# Patient Record
Sex: Female | Born: 1945
Health system: Southern US, Community
[De-identification: ages and names within clinical notes are randomized; demographics above are authoritative.]

## PROBLEM LIST (undated history)

## (undated) DIAGNOSIS — M1990 Unspecified osteoarthritis, unspecified site: Secondary | ICD-10-CM

## (undated) DIAGNOSIS — I10 Essential (primary) hypertension: Secondary | ICD-10-CM

## (undated) DIAGNOSIS — H409 Unspecified glaucoma: Secondary | ICD-10-CM

## (undated) DIAGNOSIS — E119 Type 2 diabetes mellitus without complications: Secondary | ICD-10-CM

## (undated) HISTORY — DX: Type 2 diabetes mellitus without complications: E11.9

## (undated) HISTORY — PX: NEPHRECTOMY: SHX65

## (undated) HISTORY — DX: Unspecified glaucoma: H40.9

## (undated) HISTORY — DX: Unspecified osteoarthritis, unspecified site: M19.90

## (undated) HISTORY — PX: CHOLECYSTECTOMY: SHX55

## (undated) HISTORY — DX: Essential (primary) hypertension: I10

## (undated) HISTORY — PX: OOPHORECTOMY: SHX86

---

## 2010-06-13 ENCOUNTER — Encounter (INDEPENDENT_AMBULATORY_CARE_PROVIDER_SITE_OTHER): Payer: Self-pay | Admitting: *Deleted

## 2010-06-13 LAB — CONVERTED CEMR LAB
ALT: 12 units/L (ref 0–35)
Albumin: 4.5 g/dL (ref 3.5–5.2)
CO2: 30 meq/L (ref 19–32)
Chloride: 101 meq/L (ref 96–112)
Cholesterol: 209 mg/dL — ABNORMAL HIGH (ref 0–200)
Glucose, Bld: 251 mg/dL — ABNORMAL HIGH (ref 70–99)
Microalb, Ur: 0.5 mg/dL (ref 0.00–1.89)
Potassium: 4.2 meq/L (ref 3.5–5.3)
Sodium: 141 meq/L (ref 135–145)
Total Bilirubin: 0.6 mg/dL (ref 0.3–1.2)
Total Protein: 7.3 g/dL (ref 6.0–8.3)
Triglycerides: 153 mg/dL — ABNORMAL HIGH (ref <150)
VLDL: 31 mg/dL (ref 0–40)

## 2010-07-29 ENCOUNTER — Emergency Department (HOSPITAL_COMMUNITY)
Admission: EM | Admit: 2010-07-29 | Discharge: 2010-07-29 | Payer: Self-pay | Source: Home / Self Care | Admitting: Emergency Medicine

## 2010-07-29 LAB — DIFFERENTIAL
Basophils Absolute: 0 10*3/uL (ref 0.0–0.1)
Lymphocytes Relative: 43 % (ref 12–46)
Lymphs Abs: 3.3 10*3/uL (ref 0.7–4.0)
Monocytes Absolute: 0.5 10*3/uL (ref 0.1–1.0)
Neutro Abs: 3.6 10*3/uL (ref 1.7–7.7)

## 2010-07-29 LAB — URINALYSIS, ROUTINE W REFLEX MICROSCOPIC
Ketones, ur: 15 mg/dL — AB
Nitrite: NEGATIVE
Urobilinogen, UA: 0.2 mg/dL (ref 0.0–1.0)
pH: 5 (ref 5.0–8.0)

## 2010-07-29 LAB — CBC
HCT: 40.4 % (ref 36.0–46.0)
Hemoglobin: 13.3 g/dL (ref 12.0–15.0)
MCHC: 32.9 g/dL (ref 30.0–36.0)

## 2010-07-29 LAB — COMPREHENSIVE METABOLIC PANEL
Albumin: 4 g/dL (ref 3.5–5.2)
BUN: 12 mg/dL (ref 6–23)
Calcium: 9.2 mg/dL (ref 8.4–10.5)
Creatinine, Ser: 0.84 mg/dL (ref 0.4–1.2)
Glucose, Bld: 264 mg/dL — ABNORMAL HIGH (ref 70–99)
Potassium: 3.9 mEq/L (ref 3.5–5.1)
Total Protein: 6.9 g/dL (ref 6.0–8.3)

## 2010-07-29 LAB — LIPASE, BLOOD: Lipase: 30 U/L (ref 11–59)

## 2010-07-29 LAB — GLUCOSE, CAPILLARY

## 2010-08-03 ENCOUNTER — Other Ambulatory Visit: Payer: Self-pay | Admitting: Surgery

## 2010-08-03 ENCOUNTER — Other Ambulatory Visit (HOSPITAL_COMMUNITY): Payer: Self-pay | Admitting: Surgery

## 2010-08-03 ENCOUNTER — Ambulatory Visit (HOSPITAL_COMMUNITY)
Admission: RE | Admit: 2010-08-03 | Discharge: 2010-08-04 | Disposition: A | Discharge status: Home / Self Care | Payer: PRIVATE HEALTH INSURANCE | Source: Ambulatory Visit | Attending: Surgery | Admitting: Surgery

## 2010-08-03 ENCOUNTER — Ambulatory Visit (HOSPITAL_COMMUNITY): Payer: PRIVATE HEALTH INSURANCE

## 2010-08-03 DIAGNOSIS — Z01818 Encounter for other preprocedural examination: Secondary | ICD-10-CM | POA: Insufficient documentation

## 2010-08-03 DIAGNOSIS — K829 Disease of gallbladder, unspecified: Secondary | ICD-10-CM

## 2010-08-03 DIAGNOSIS — K8020 Calculus of gallbladder without cholecystitis without obstruction: Secondary | ICD-10-CM | POA: Insufficient documentation

## 2010-08-03 DIAGNOSIS — Z01812 Encounter for preprocedural laboratory examination: Secondary | ICD-10-CM | POA: Insufficient documentation

## 2010-08-03 DIAGNOSIS — I10 Essential (primary) hypertension: Secondary | ICD-10-CM | POA: Insufficient documentation

## 2010-08-03 DIAGNOSIS — E119 Type 2 diabetes mellitus without complications: Secondary | ICD-10-CM | POA: Insufficient documentation

## 2010-08-03 DIAGNOSIS — Z01810 Encounter for preprocedural cardiovascular examination: Secondary | ICD-10-CM | POA: Insufficient documentation

## 2010-08-03 LAB — SURGICAL PCR SCREEN
MRSA, PCR: NEGATIVE
Staphylococcus aureus: NEGATIVE

## 2010-08-03 LAB — GLUCOSE, CAPILLARY

## 2010-08-04 LAB — GLUCOSE, CAPILLARY

## 2010-08-06 LAB — COMPREHENSIVE METABOLIC PANEL
ALT: 16 U/L (ref 0–35)
AST: 19 U/L (ref 0–37)
Alkaline Phosphatase: 89 U/L (ref 39–117)
CO2: 29 mEq/L (ref 19–32)
Calcium: 10.1 mg/dL (ref 8.4–10.5)
GFR calc Af Amer: >60 mL/min (ref >60)
Potassium: 4.2 mEq/L (ref 3.5–5.1)
Sodium: 141 mEq/L (ref 135–145)
Total Protein: 7.1 g/dL (ref 6.0–8.3)

## 2010-08-06 LAB — CBC
HCT: 41.3 % (ref 36.0–46.0)
MCHC: 33.2 g/dL (ref 30.0–36.0)
MCV: 86.2 fL (ref 78.0–100.0)
RDW: 12.3 % (ref 11.5–15.5)

## 2010-08-15 NOTE — Op Note (Signed)
  NAME:  Tina Phelps, Tina Phelps                 ACCOUNT NO.:  [id number]  MEDICAL RECORD NO.:  [id number]           PATIENT TYPE:  I  LOCATION:  5128                         FACILITY:  [facility]  PHYSICIAN:  Abigail Miyamoto, M.D. DATE OF BIRTH:  December 31, 1945  DATE OF PROCEDURE:  08/03/2010 DATE OF DISCHARGE:                              OPERATIVE REPORT   PREOPERATIVE DIAGNOSIS:  Symptomatic cholelithiasis.  POSTOPERATIVE DIAGNOSIS:  Symptomatic cholelithiasis.  PROCEDURE:  Laparoscopic cholecystectomy.  SURGEON:  Abigail Miyamoto, MD.  ANESTHESIA:  General and 0.25% Marcaine with epinephrine.  ESTIMATED BLOOD LOSS:  Minimal.  FINDINGS:  The patient had a chronically scarred appearing gallbladder full of gallstones.  PROCEDURE IN DETAIL:  The patient was brought to the operating room, identified as Tina Phelps.  She was placed supine on the operating table and general anesthesia was induced.  Her abdomen was then prepped and draped in the usual sterile fashion.  Using a 15-blade, a small vertical incision was made below the umbilicus.  This was carried down to the fascia, which was then incised with a scalpel.  A hemostat was then used to pass to the peritoneal cavity under direct vision.  Next, a 0 Vicryl pursestring suture was placed around the fascial opening.  The Hasson port was placed through the opening and insufflation of the abdomen was begun.  A 5-mm port was then placed in the epigastrium and two more in the right upper quadrant, all under direct vision.  Gallbladder was identified and it was found to be distended and completely full of gallstones.  It was elevated above the liver.  The cystic duct was then easily dissected out and a critical window was achieved around it.  It was clipped three times proximally, once distally, and transected.  The cystic artery was then identified and clipped several times proximally and distally and transected as well.  The gallbladder was then  easily dissected free from liver bed with the electrocautery.  Once it was free from liver bed, the liver bed was examined.  Hemostasis was felt to be achieved.  The gallbladder was then removed through the incision at the umbilicus.  I had to open up the gallbladder and remove several gallstones as it was quite packed in order to pull it through the fascial opening.  The 0 Vicryl to the umbilicus was then tied in place closing the fascial defect.  I then thoroughly irrigated the abdomen with normal saline.  Again, hemostasis appeared to be achieved.  All ports were then removed under direct vision and the abdomen was deflated.  All incisions were then anesthetized with Marcaine and closed with 4-0 Monocryl subcuticular sutures.  Steri-Strips and Band-Aids were then applied. The patient tolerated the procedure well.  All counts were correct at the end of the procedure.  The patient was then extubated in the operating room and taken in stable condition to the recovery room.     Abigail Miyamoto, M.D.     DB/MEDQ  D:  08/03/2010  T:  08/04/2010  Job:  [id number]  Electronically Signed by Abigail Miyamoto M.D. on 08/15/2010 07:29:09 PM

## 2010-08-22 NOTE — Discharge Summary (Signed)
  NAME:  Tina Phelps, Virag Neaveh                 ACCOUNT NO.:  [id number]  MEDICAL RECORD NO.:  [id number]           PATIENT TYPE:  I  LOCATION:  5128                         FACILITY:  [facility]  PHYSICIAN:  Abigail Miyamoto, M.D. DATE OF BIRTH:  Jan 03, 1946  DATE OF ADMISSION:  08/03/2010 DATE OF DISCHARGE:  08/04/2010                              DISCHARGE SUMMARY   DISCHARGE DIAGNOSES:  Symptomatic cholelithiasis.  She is status post laparoscopic cholecystectomy.  SUMMARY OF HISTORY:  This is a 65 year old female from [place] who presents with symptomatic cholelithiasis.  Decision was made to proceed with laparoscopic cholecystectomy.  HOSPITAL COURSE:  The patient was admitted and taken to the operating room where she underwent a laparoscopic cholecystectomy.  She tolerated the procedure well and was taken to a regular surgical floor.  She was kept overnight secondary to her comorbidities and the fact she did not speak any [place].  On postoperative day 1, she was doing quite well, she was tolerating her diet.  Her pain was well controlled.  Her incisions were healing well, and decision was made to discharge the patient to home.  DISCHARGE DIET:  Regular.  DISCHARGE ACTIVITY:  She is to do no heavy lifting greater than 20 pounds for the next 2 weeks.  She may shower.  DISCHARGE FOLLOWUP:  She will follow up with [facility] in 1 week postdischarge.     Abigail Miyamoto, M.D.     DB/MEDQ  D:  08/15/2010  T:  08/16/2010  Job:  [id number]  Electronically Signed by Abigail Miyamoto M.D. on 08/22/2010 01:36:30 PM

## 2010-08-29 ENCOUNTER — Encounter (INDEPENDENT_AMBULATORY_CARE_PROVIDER_SITE_OTHER): Payer: Self-pay | Admitting: *Deleted

## 2010-08-29 LAB — CONVERTED CEMR LAB
ALT: 10 units/L (ref 0–35)
AST: 15 units/L (ref 0–37)
Albumin: 4.5 g/dL (ref 3.5–5.2)
Alkaline Phosphatase: 93 units/L (ref 39–117)
BUN: 11 mg/dL (ref 6–23)
CO2: 25 meq/L (ref 19–32)
Calcium: 10 mg/dL (ref 8.4–10.5)
Chloride: 101 meq/L (ref 96–112)
Creatinine, Ser: 0.79 mg/dL (ref 0.40–1.20)
Glucose, Bld: 251 mg/dL — ABNORMAL HIGH (ref 70–99)
Potassium: 4.8 meq/L (ref 3.5–5.3)
Sodium: 137 meq/L (ref 135–145)
Total Bilirubin: 0.6 mg/dL (ref 0.3–1.2)
Total Protein: 7.3 g/dL (ref 6.0–8.3)

## 2011-11-12 IMAGING — US US ABDOMEN COMPLETE
1 series · 14 of 25 positions shown · non-contrast
Comparison: None.

CLINICAL DATA: Abdominal pain.  Nausea and vomiting.

ABDOMINAL ULTRASOUND COMPLETE

[Series 1: us abdomen complete · 0.21mm/px · 14 of 59 slices shown]
[im 1/59]
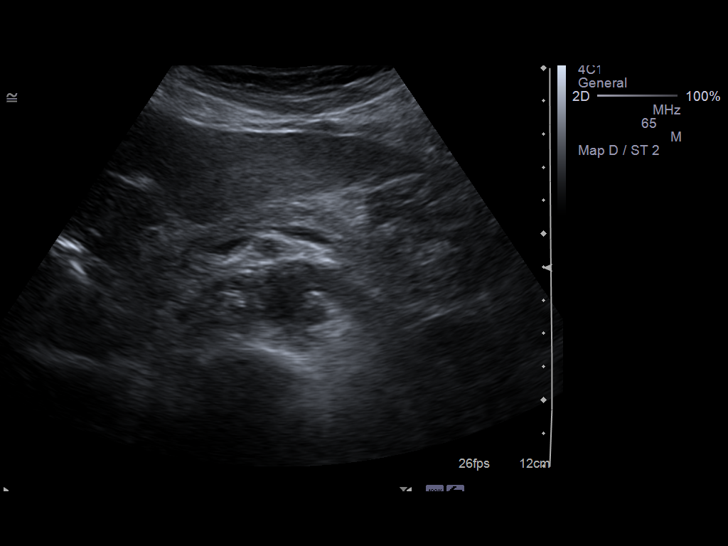
[im 5/59]
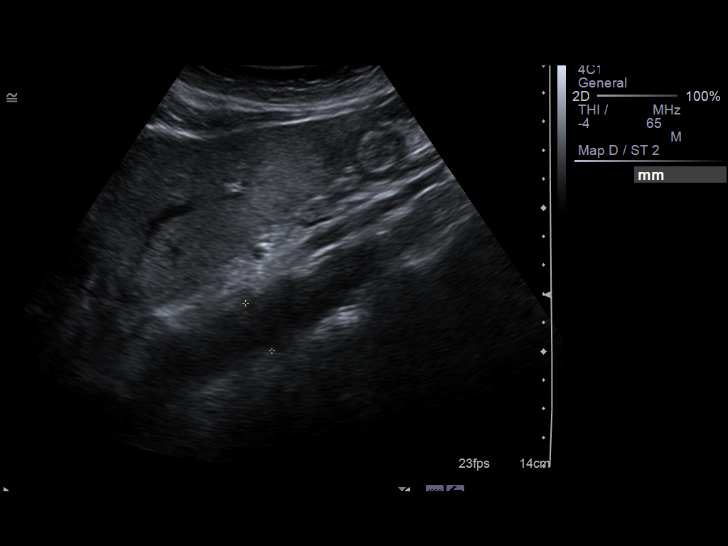
[im 10/59]
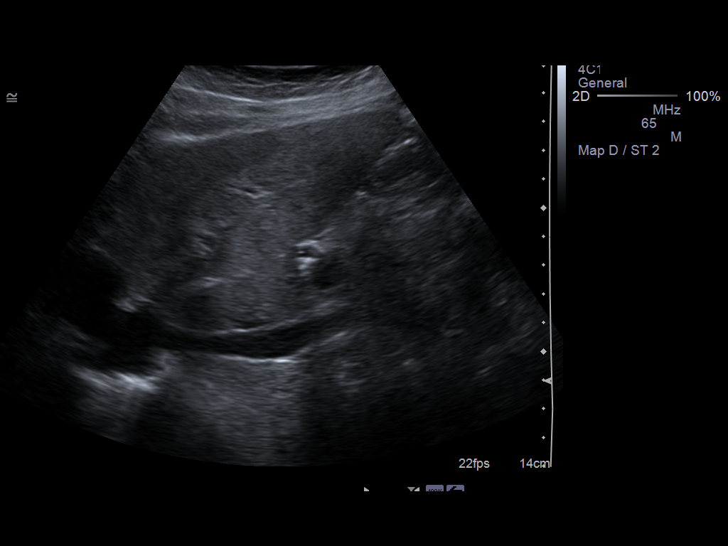
[im 15/59]
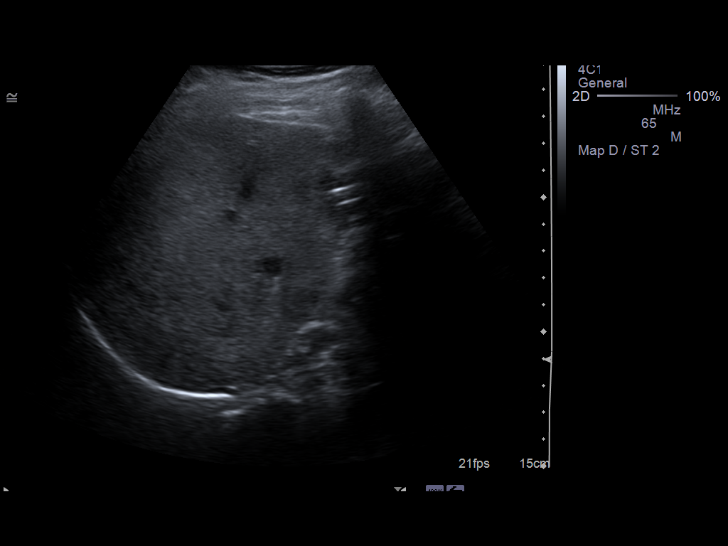
[im 20/59]
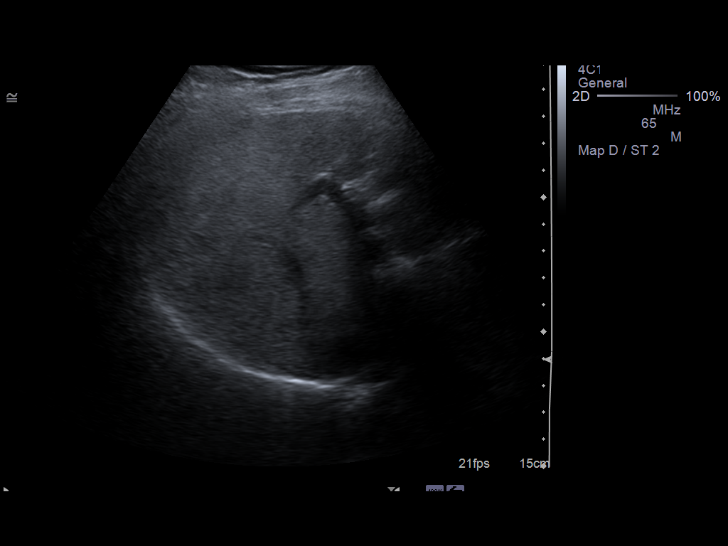
[im 22/59]
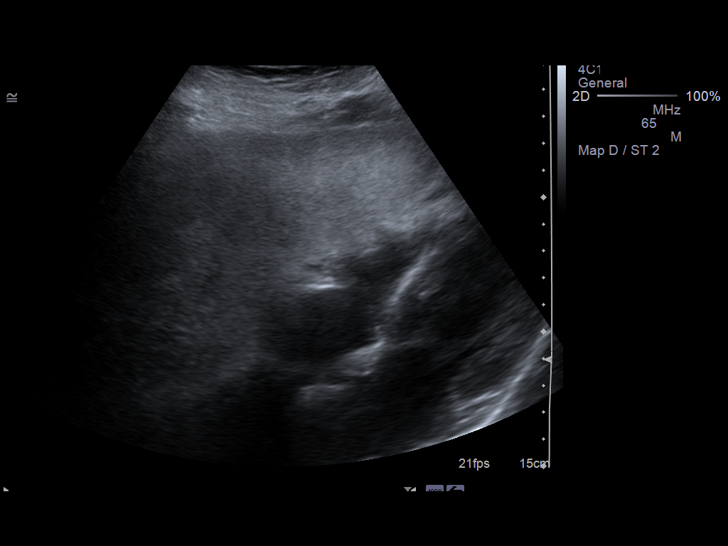
[im 27/59]
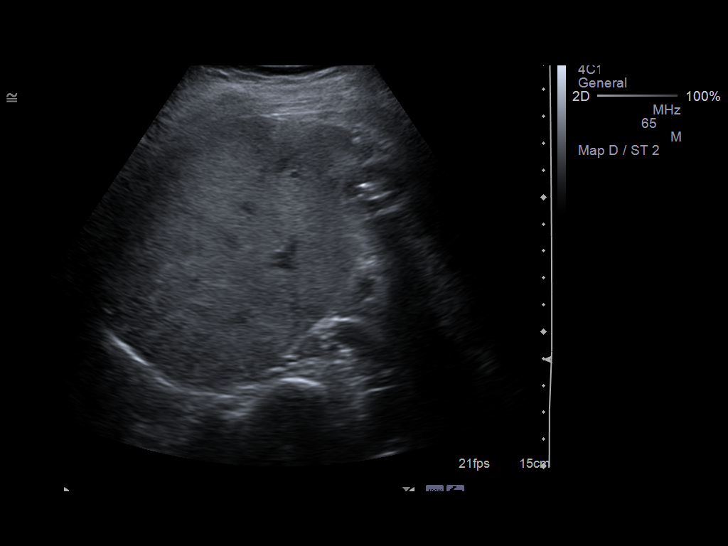
[im 32/59]
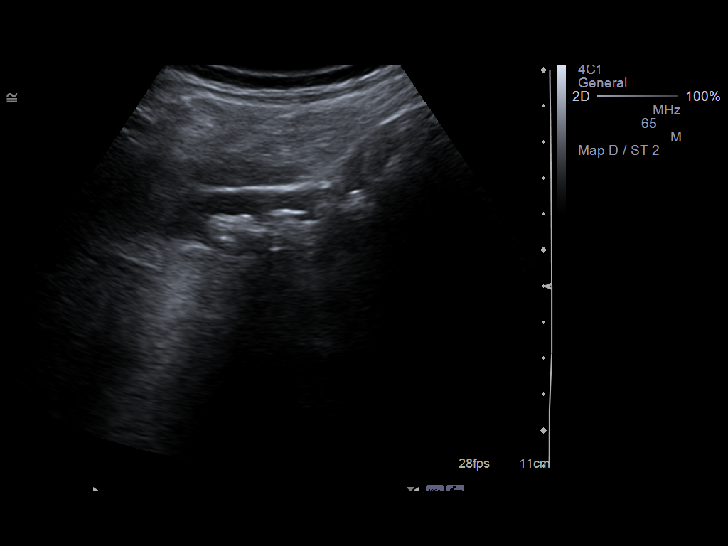
[im 37/59]
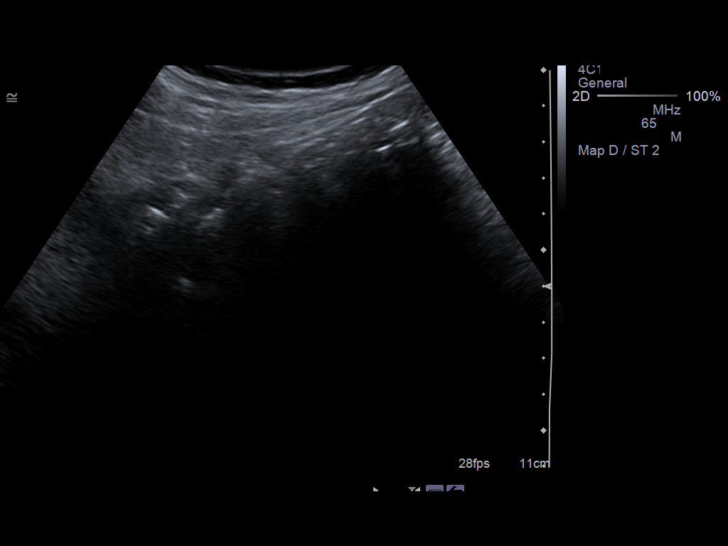
[im 39/59]
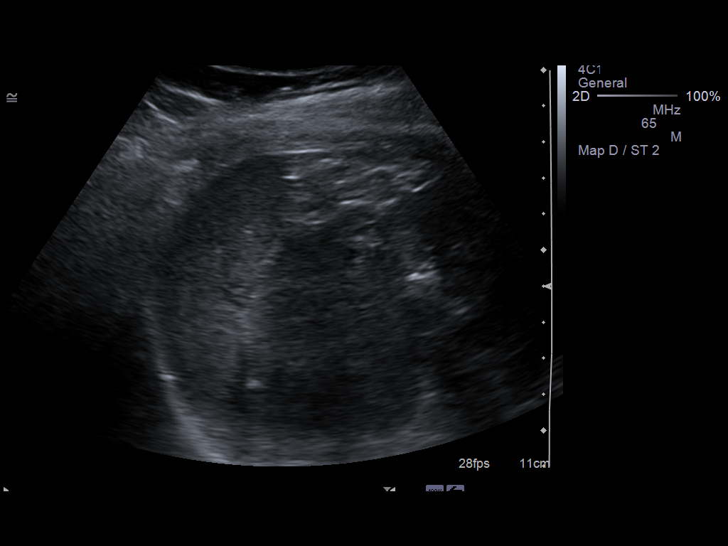
[im 44/59]
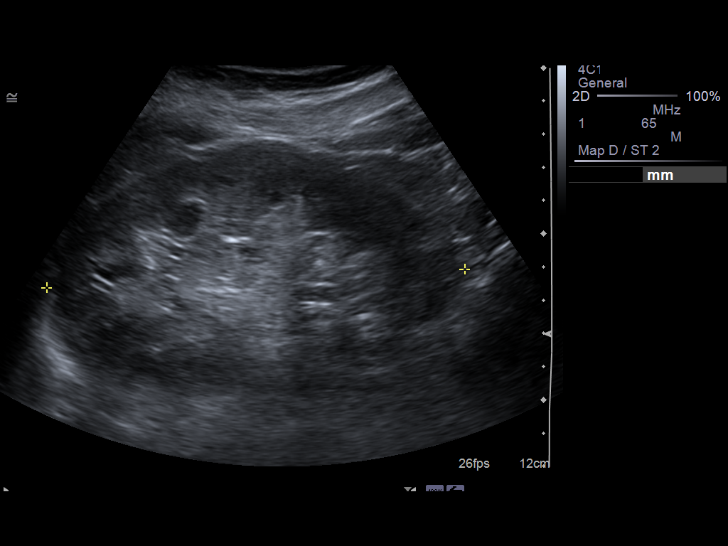
[im 49/59]
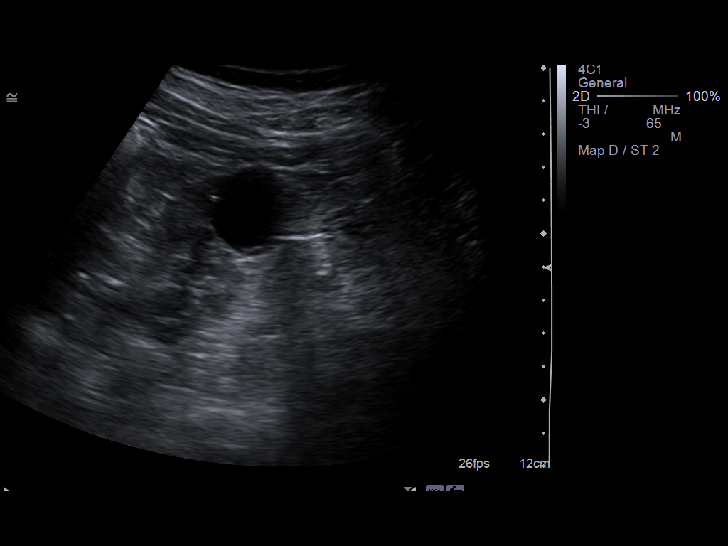
[im 54/59]
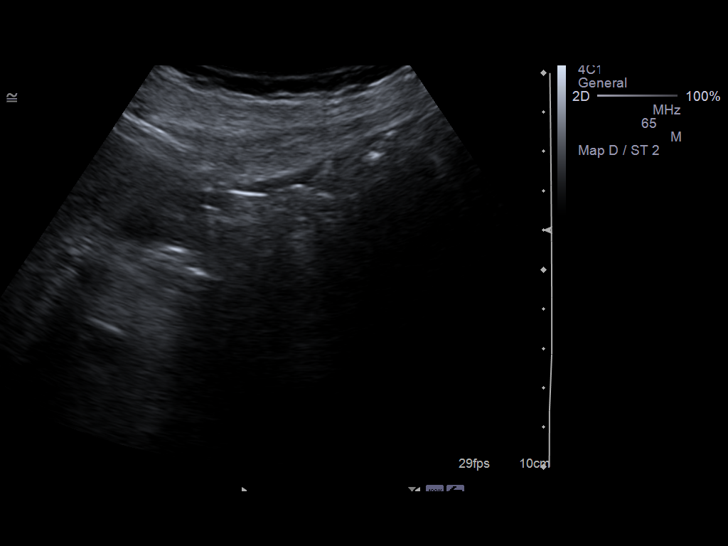
[im 59/59]
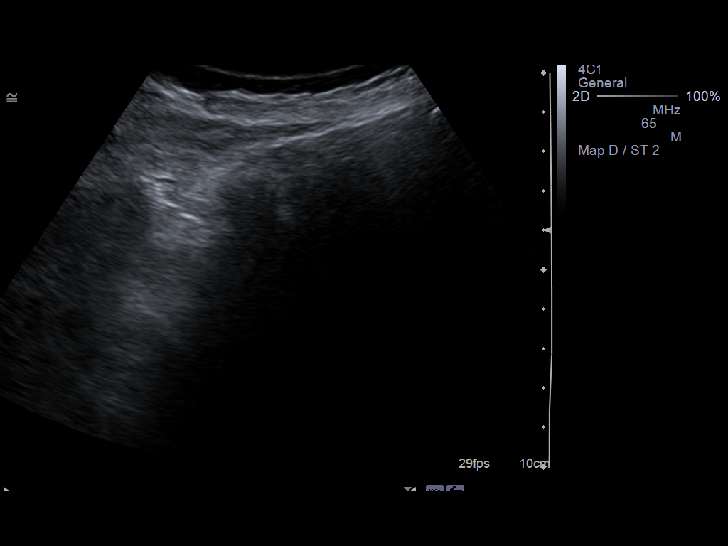

[14 of 25 positions shown; findings below may reference images not displayed]

FINDINGS: Gallbladder:  Gallstones are seen completely filling the
gallbladder lumen.  No definite gallbladder wall thickening
although visualization of the gallbladder wall is suboptimal.

Common Bile Duct:  Measures 7 mm in diameter.

Liver: Diffusely increased parenchymal echogenicity, consistent
with hepatic steatosis.  No focal liver mass identified.

IVC:  Appears normal.

Pancreas:  No abnormality identified.

Spleen:  Within normal limits in size and echotexture.

Right kidney:  Surgically absent.

Left kidney:  Normal in size and parenchymal echogenicity.  No
evidence of mass or hydronephrosis.  2.8 cm simple cyst is seen in
the lower pole.

Abdominal Aorta:  No aneurysm identified.
IMPRESSION: 1.  Gallbladder completely filled with gallstones.  No definite
sonographic signs of acute cholecystitis.
2.  Borderline common bile duct which measures 7 mm in diameter.
No evidence of intrahepatic ductal dilatation.
3.  Hepatic steatosis.

## 2011-11-17 IMAGING — CR DG CHEST 2V
2 series · 2 of 2 positions shown · non-contrast
Comparison: None.

CLINICAL DATA: Preoperative evaluation for cholecystectomy.
Nonsmoker.  No current chest complaints.  Hypertension

CHEST - 2 VIEW

[view not recorded (1 of 2)]
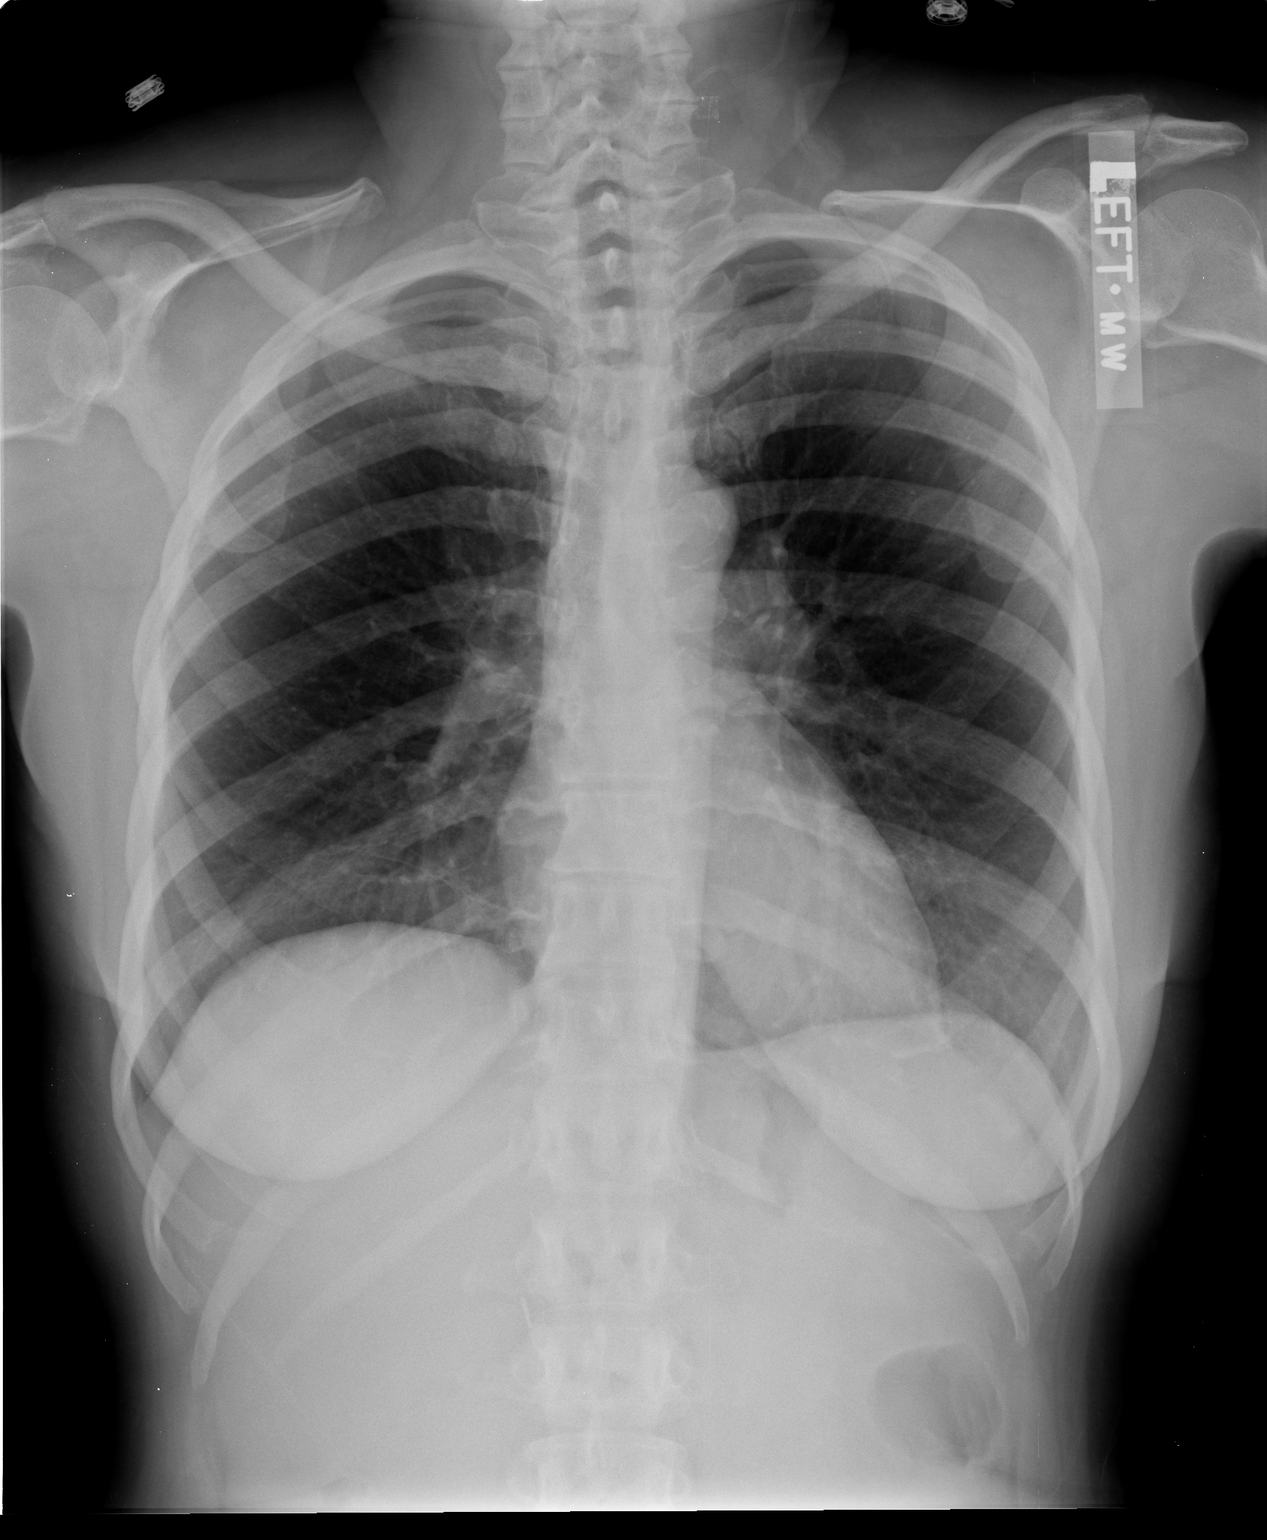

[view not recorded (2 of 2)]
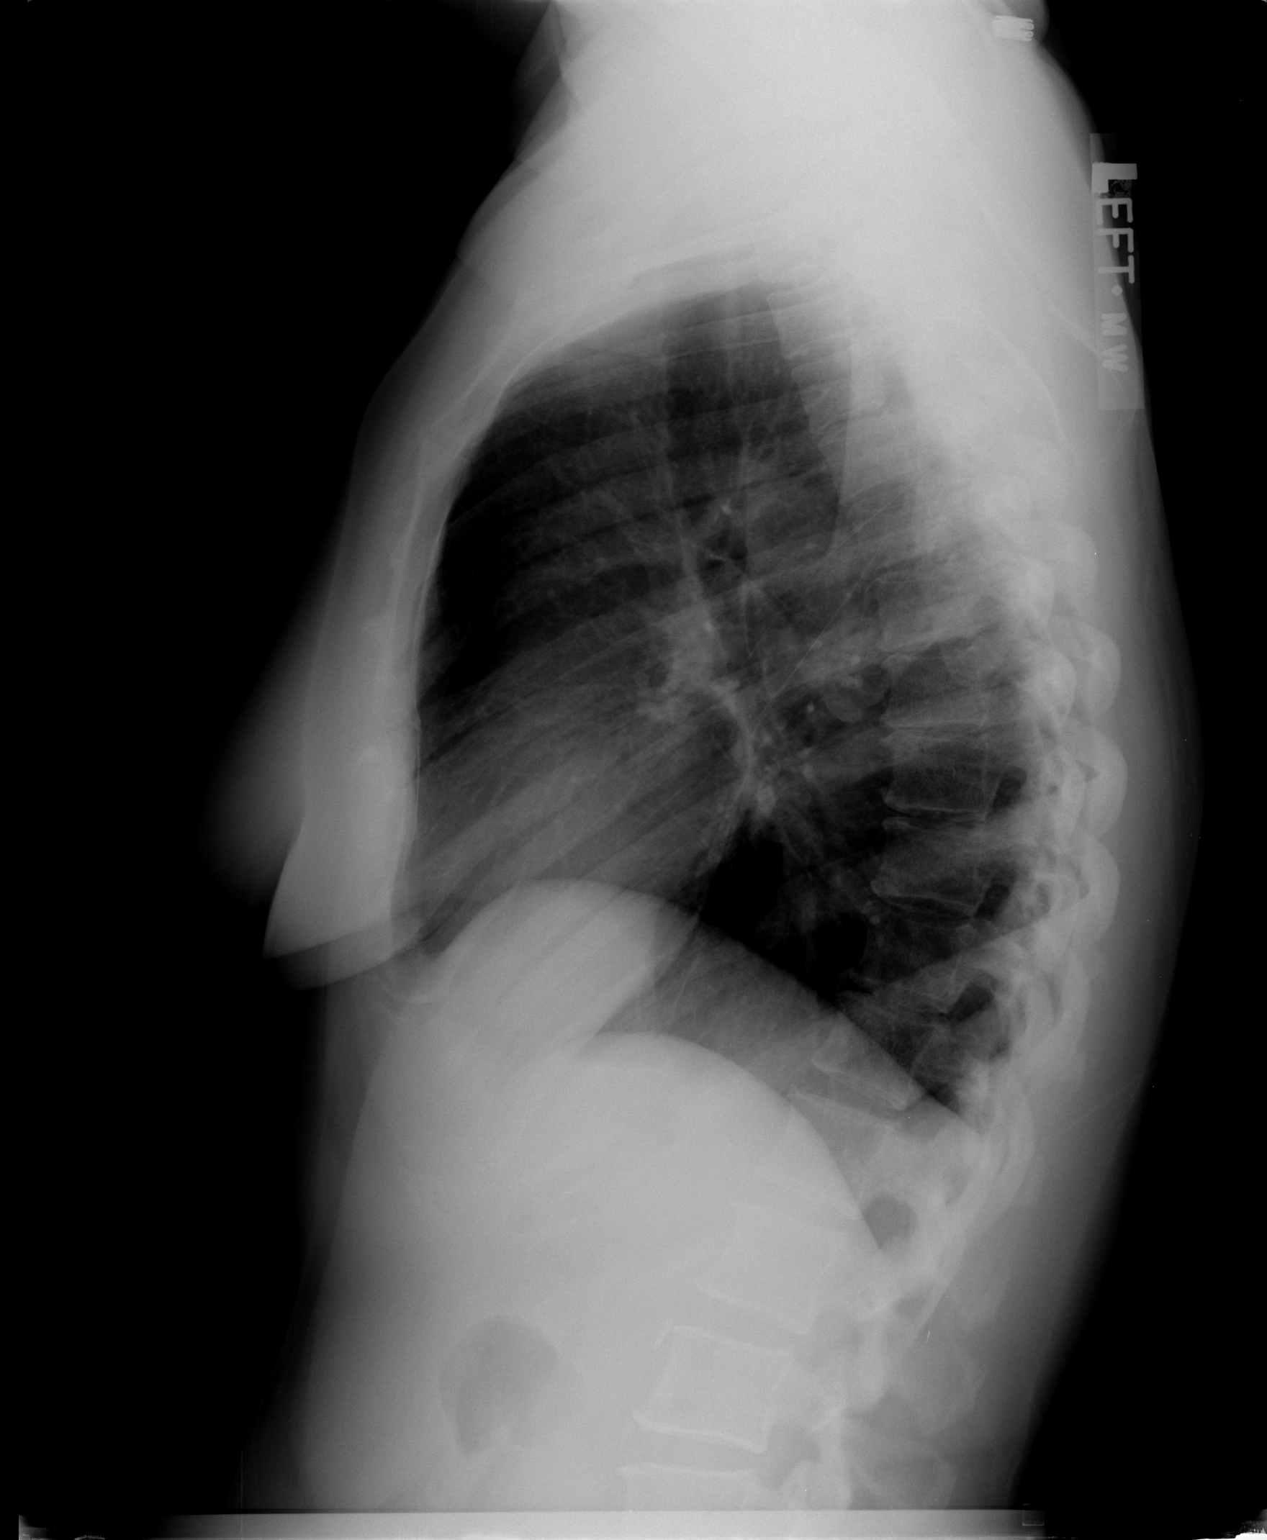

[2 of 2 positions shown; findings below may reference images not displayed]

FINDINGS: Heart and mediastinal contours are within normal limits.
The lung fields are clear with no signs of focal infiltrate or
congestive failure.  No pleural fluid or significant peribronchial
cuffing is seen.

Bony structures are notable for mild degenerative change of the mid
thoracic spine and are otherwise intact.
IMPRESSION: No focal or acute cardiopulmonary abnormality suggested.

## 2012-10-28 ENCOUNTER — Emergency Department (HOSPITAL_COMMUNITY)
Admission: EM | Admit: 2012-10-28 | Discharge: 2012-10-28 | Disposition: A | Discharge status: Home Health | Payer: No Typology Code available for payment source | Source: Home / Self Care

## 2012-10-28 ENCOUNTER — Encounter (HOSPITAL_COMMUNITY): Payer: Self-pay

## 2012-10-28 DIAGNOSIS — K047 Periapical abscess without sinus: Secondary | ICD-10-CM

## 2012-10-28 DIAGNOSIS — M79642 Pain in left hand: Secondary | ICD-10-CM

## 2012-10-28 DIAGNOSIS — E119 Type 2 diabetes mellitus without complications: Secondary | ICD-10-CM

## 2012-10-28 DIAGNOSIS — M25512 Pain in left shoulder: Secondary | ICD-10-CM

## 2012-10-28 MED ORDER — DORZOLAMIDE HCL-TIMOLOL MAL 2-0.5 % OP SOLN: 1.0000 [drp] | Freq: Two times a day (BID) | OPHTHALMIC | Status: DC

## 2012-10-28 MED ORDER — METFORMIN HCL 500 MG PO TABS: 500.0000 mg | ORAL_TABLET | Freq: Two times a day (BID) | ORAL | Status: DC

## 2012-10-28 MED ORDER — GLIMEPIRIDE 4 MG PO TABS: 4.0000 mg | ORAL_TABLET | Freq: Every day | ORAL | Status: DC

## 2012-10-28 MED ORDER — TIMOLOL HEMIHYDRATE 0.5 % OP SOLN: 1.0000 [drp] | Freq: Every day | OPHTHALMIC | Status: DC

## 2012-10-28 MED ORDER — LISINOPRIL 10 MG PO TABS: 10.0000 mg | ORAL_TABLET | Freq: Every day | ORAL | Status: DC

## 2012-10-28 MED ORDER — PRAVASTATIN SODIUM 40 MG PO TABS: 40.0000 mg | ORAL_TABLET | Freq: Every day | ORAL | Status: DC

## 2012-10-28 MED ORDER — CLINDAMYCIN HCL 300 MG PO CAPS: 300.0000 mg | ORAL_CAPSULE | Freq: Three times a day (TID) | ORAL | Status: DC

## 2012-10-28 MED ORDER — TRAMADOL HCL 50 MG PO TABS: 50.0000 mg | ORAL_TABLET | Freq: Four times a day (QID) | ORAL | Status: DC | PRN

## 2012-10-28 NOTE — ED Notes (Signed)
Patient states here for hand pain Needs physical

## 2012-10-28 NOTE — ED Notes (Signed)
Patient has an appt at [facility] 11/02/12 @ 2pm

## 2012-10-29 NOTE — ED Notes (Signed)
Referral faxed to [facility] 

## 2012-11-02 ENCOUNTER — Ambulatory Visit (INDEPENDENT_AMBULATORY_CARE_PROVIDER_SITE_OTHER): Payer: No Typology Code available for payment source | Admitting: Family Medicine

## 2012-11-02 VITALS — BP 138/84 | Ht 60.0 in | Wt 149.0 lb

## 2012-11-02 DIAGNOSIS — M25539 Pain in unspecified wrist: Secondary | ICD-10-CM

## 2012-11-02 DIAGNOSIS — M25512 Pain in left shoulder: Secondary | ICD-10-CM

## 2012-11-02 DIAGNOSIS — M25519 Pain in unspecified shoulder: Secondary | ICD-10-CM

## 2012-11-02 DIAGNOSIS — M25532 Pain in left wrist: Secondary | ICD-10-CM

## 2012-11-06 DIAGNOSIS — M25519 Pain in unspecified shoulder: Secondary | ICD-10-CM | POA: Insufficient documentation

## 2012-11-06 DIAGNOSIS — M25532 Pain in left wrist: Secondary | ICD-10-CM | POA: Insufficient documentation

## 2012-11-06 NOTE — Progress Notes (Signed)
  Subjective:    Patient ID: [name], female    DOB: 11/24/1945, 67 y.o.   MRN: [id number]  HPI  She is here with her daughter who acts as interpreter as she speaks only a small amount of [place]. Complaint of several months of left shoulder pain it is gradually getting a little worse. It is an aching pain. She thinks it's related to arthritis. Worse with movements of her head and was picking up things. Also some aching pain in her left wrist and hand. These both seem worse when her shoulders aggravating her. She is right-hand dominant.  PERTINENT  PMH / PSH: No prior history of shoulder injury or surgery, no history of left hand or wrist injury. Diabetes mellitus   Review of Systems Denies unusual weight change, fever, sweats, chills.    Objective:   Physical Exam Vital signs are reviewed GENERAL: Well-developed female no acute distress SHOULDER: Right. Full range of motion. Left: Full range of motion but pain with supraspinatus testing. She's also tender over the a.c. joint area when this area is palpated. The deltoid muscles mildly tender to palpation. WRISTS: Left. Full range of motion. No effusion. Nontender to palpation. Full-strength in flexion and extension at the wrist. Normal grip strength the left hand.       Assessment & Plan:  #1. Shoulder pain. I think this is a combination of some mild glenohumeral arthritis and some subacromial bursitis. We discussed options. She wants to try the home rehabilitation program. She does not want to consider medication or corticosteroid injection. Followup when necessary

## 2012-11-17 ENCOUNTER — Ambulatory Visit: Payer: No Typology Code available for payment source | Attending: Internal Medicine | Admitting: Internal Medicine

## 2012-11-17 VITALS — BP 166/84 | HR 80 | Temp 98.4°F | Resp 18 | Ht 62.0 in | Wt 140.0 lb

## 2012-11-17 DIAGNOSIS — H409 Unspecified glaucoma: Secondary | ICD-10-CM

## 2012-11-17 DIAGNOSIS — M1990 Unspecified osteoarthritis, unspecified site: Secondary | ICD-10-CM | POA: Insufficient documentation

## 2012-11-17 DIAGNOSIS — M129 Arthropathy, unspecified: Secondary | ICD-10-CM

## 2012-11-17 DIAGNOSIS — E785 Hyperlipidemia, unspecified: Secondary | ICD-10-CM | POA: Insufficient documentation

## 2012-11-17 DIAGNOSIS — E119 Type 2 diabetes mellitus without complications: Secondary | ICD-10-CM

## 2012-11-17 DIAGNOSIS — I10 Essential (primary) hypertension: Secondary | ICD-10-CM | POA: Insufficient documentation

## 2012-11-17 DIAGNOSIS — E118 Type 2 diabetes mellitus with unspecified complications: Secondary | ICD-10-CM | POA: Insufficient documentation

## 2012-11-17 MED ORDER — TIMOLOL HEMIHYDRATE 0.5 % OP SOLN: 1.0000 [drp] | Freq: Every day | OPHTHALMIC | Status: DC

## 2012-11-17 MED ORDER — GLIMEPIRIDE 4 MG PO TABS: 4.0000 mg | ORAL_TABLET | Freq: Every day | ORAL | Status: DC

## 2012-11-17 MED ORDER — METFORMIN HCL 500 MG PO TABS: 500.0000 mg | ORAL_TABLET | Freq: Two times a day (BID) | ORAL | Status: DC

## 2012-11-17 MED ORDER — LISINOPRIL 10 MG PO TABS: 10.0000 mg | ORAL_TABLET | Freq: Every day | ORAL | Status: DC

## 2012-11-17 MED ORDER — TRAMADOL HCL 50 MG PO TABS: 50.0000 mg | ORAL_TABLET | Freq: Four times a day (QID) | ORAL | Status: DC | PRN

## 2012-11-17 MED ORDER — DORZOLAMIDE HCL-TIMOLOL MAL 2-0.5 % OP SOLN: 1.0000 [drp] | Freq: Two times a day (BID) | OPHTHALMIC | Status: DC

## 2012-11-17 MED ORDER — PRAVASTATIN SODIUM 40 MG PO TABS: 40.0000 mg | ORAL_TABLET | Freq: Every day | ORAL | Status: DC

## 2012-11-17 NOTE — Progress Notes (Signed)
Patient ID: [name], female   DOB: 01-25-1946, 67 y.o.   MRN: [id number] Patient Demographics  Tina Phelps, is a 67 y.o. female  AOZ:[id number]  NGE:[id number]  DOB - Oct 15, 1945  Chief Complaint  Patient presents with  . Dizziness  . Weight Loss        Subjective:   Tina Phelps today is here for a follow up visit. Patient has No headache, No chest pain, No abdominal pain - No Nausea, No new weakness tingling or numbness, No Cough - SOB. History was obtained with help of translation from her daughter accompanying the patient. Patient speaks little [place]. Patient's daughter reports that she has not been eating well lately, intentionally to keep her blood sugars down. She has been feeling dizzy due to that, lost about 10lbs in last 2months. Patient lives in a [place] for 6 months and then in [place] for other 6 months. Patient's daughter reports that her blood sugars has been high in 300s, her hemoglobin A1c was 10 checked 3 months ago at [facility] urgent [facility]. Patient absolutely does not want insulin. Her husband had passed away after he was started on insulin and patient has psychological aversion to insulin. She is flying back to [place] on June 26.  Objective:    Filed Vitals:   11/17/12 1623  BP: 166/84  Pulse: 80  Temp: 98.4 F (36.9 C)  Resp: 18  Height: 5\' 2"  (1.575 m)  Weight: 140 lb (63.504 kg)  SpO2: 99%     ALLERGIES:  No Known Allergies  PAST MEDICAL HISTORY: Past Medical History  Diagnosis Date  . Diabetes mellitus without complication   . Glaucoma   . Hypertension   . Arthritis     MEDICATIONS AT HOME: Prior to Admission medications   Medication Sig Start Date End Date Taking? Authorizing Provider  dorzolamide-timolol (COSOPT) 22.3-6.8 MG/ML ophthalmic solution Place 1 drop into both eyes 2 (two) times daily. 11/17/12  Yes Ripudeep Jenna Luo, MD  glimepiride (AMARYL) 4 MG tablet Take 1 tablet (4 mg total) by mouth daily before breakfast. 11/17/12  Yes  Ripudeep K Rai, MD  lisinopril (PRINIVIL,ZESTRIL) 10 MG tablet Take 1 tablet (10 mg total) by mouth daily. 11/17/12  Yes Ripudeep Jenna Luo, MD  metFORMIN (GLUCOPHAGE) 500 MG tablet Take 1 tablet (500 mg total) by mouth 2 (two) times daily with a meal. 11/17/12  Yes Ripudeep K Rai, MD  pravastatin (PRAVACHOL) 40 MG tablet Take 1 tablet (40 mg total) by mouth daily. 11/17/12  Yes Ripudeep Jenna Luo, MD  timolol (BETIMOL) 0.5 % ophthalmic solution Place 1 drop into both eyes at bedtime. 11/17/12  Yes Ripudeep Jenna Luo, MD  clindamycin (CLEOCIN) 300 MG capsule Take 1 capsule (300 mg total) by mouth 3 (three) times daily. 10/28/12   Edsel Petrin, DO  traMADol (ULTRAM) 50 MG tablet Take 1 tablet (50 mg total) by mouth every 6 (six) hours as needed for pain. 11/17/12   Ripudeep Jenna Luo, MD     Exam  General appearance :Awake, alert, NAD, Speech Clear.  HEENT: Atraumatic and Normocephalic, PERLA Neck: supple, no JVD. No cervical lymphadenopathy.  Chest: Clear to auscultation bilaterally, no wheezing, rales or rhonchi CVS: S1 S2 regular, no murmurs.  Abdomen: soft, NBS, NT, ND, no gaurding, rigidity or rebound. Extremities: no cyanosis or clubbing, B/L Lower Ext shows no edema Neurology: Awake alert, and oriented X 3, CN II-XII intact, Non focal Skin: No Rash or lesions Wounds:N/A    Data Review  Basic Metabolic Panel: No results found for this basename: NA, K, CL, CO2, GLUCOSE, BUN, CREATININE, CALCIUM, MG, PHOS,  in the last 168 hours Liver Function Tests: No results found for this basename: AST, ALT, ALKPHOS, BILITOT, PROT, ALBUMIN,  in the last 168 hours  CBC: No results found for this basename: WBC, NEUTROABS, HGB, HCT, MCV, PLT,  in the last 168 hours  ------------------------------------------------------------------------------------------------------------------ No results found for this basename: HGBA1C,  in the last 72  hours ------------------------------------------------------------------------------------------------------------------ No results found for this basename: CHOL, HDL, LDLCALC, TRIG, CHOLHDL, LDLDIRECT,  in the last 72 hours ------------------------------------------------------------------------------------------------------------------ No results found for this basename: TSH, T4TOTAL, FREET3, T3FREE, THYROIDAB,  in the last 72 hours ------------------------------------------------------------------------------------------------------------------ No results found for this basename: VITAMINB12, FOLATE, FERRITIN, TIBC, IRON, RETICCTPCT,  in the last 72 hours  Coagulation profile  No results found for this basename: INR, PROTIME,  in the last 168 hours    Assessment & Plan   Active Problems: 1) Diabetes Mellitus appears to be in controlled  - Recheck hemoglobin A1c, patient does not want insulin - Per patient's daughter, metformin is available in [place] and she does not want to change her medications at this point. Refilled Amaryl and metformin. - Explained in detail to the patient and the daughter about the importance of diabetic diet and to avoid dehydration. - Recheck BMET hemoglobin A1c  2) hypertension: Continue lisinopril, refills added  3) hyperlipidemia : Continue statins, refills added  4) arthritis: continue tramadol (#60, 3 refills)   F/U in 84month or as needed  RAI,RIPUDEEP M.D. 11/17/2012, 4:51 PM

## 2012-11-17 NOTE — Progress Notes (Signed)
Present to clinic c/o of dizziness and weight loss

## 2012-11-17 NOTE — Patient Instructions (Signed)
1800 Calorie Diet for Diabetes Meal Planning  The 1800 calorie diet is designed for eating up to 1800 calories each day. Following this diet and making healthy meal choices can help improve overall health. This diet controls blood sugar (glucose) levels and can also help lower blood pressure and cholesterol.  SERVING SIZES  Measuring foods and serving sizes helps to make sure you are getting the right amount of food. The list below tells how big or small some common serving sizes are:   1 oz.........4 stacked dice.   3 oz.........Deck of cards.   1 tsp........Tip of little finger.   1 tbs........Thumb.   2 tbs........Golf ball.    cup.......Half of a fist.   1 cup........A fist.  GUIDELINES FOR CHOOSING FOODS  The goal of this diet is to eat a variety of foods and limit calories to 1800 each day. This can be done by choosing foods that are low in calories and fat. The diet also suggests eating small amounts of food frequently. Doing this helps control your blood glucose levels so they do not get too high or too low. Each meal or snack may include a protein food source to help you feel more satisfied and to stabilize your blood glucose. Try to eat about the same amount of food around the same time each day. This includes weekend days, travel days, and days off work. Space your meals about 4 to 5 hours apart and add a snack between them if you wish.   For example, a daily food plan could include breakfast, a morning snack, lunch, dinner, and an evening snack. Healthy meals and snacks include whole grains, vegetables, fruits, lean meats, poultry, fish, and dairy products. As you plan your meals, select a variety of foods. Choose from the bread and starch, vegetable, fruit, dairy, and meat/protein groups. Examples of foods from each group and their suggested serving sizes are listed below. Use measuring cups and spoons to become familiar with what a healthy portion looks like.  Bread and Starch   Each serving equals 15 grams of carbohydrates.   1 slice bread.    bagel.    cup cold cereal (unsweetened).    cup hot cereal or mashed potatoes.   1 small potato (size of a computer mouse).    cup cooked pasta or rice.    English muffin.   1 cup broth-based soup.   3 cups of popcorn.   4 to 6 whole-wheat crackers.    cup cooked beans, peas, or corn.  Vegetable  Each serving equals 5 grams of carbohydrates.    cup cooked vegetables.   1 cup raw vegetables.    cup tomato or vegetable juice.  Fruit  Each serving equals 15 grams of carbohydrates.   1 small apple or orange.   1 cup watermelon or strawberries.    cup applesauce (no sugar added).   2 tbs raisins.    banana.    cup canned fruit, packed in water, its own juice, or sweetened with a sugar substitute.    cup unsweetened fruit juice.  Dairy  Each serving equals 12 to 15 grams of carbohydrates.   1 cup fat-free milk.   6 oz artificially sweetened yogurt or plain yogurt.   1 cup low-fat buttermilk.   1 cup soy milk.   1 cup almond milk.  Meat/Protein   1 large egg.   2 to 3 oz meat, poultry, or fish.    cup low-fat cottage cheese.     1 tbs peanut butter.   1 oz low-fat cheese.    cup tuna in water.    cup tofu.  Fat   1 tsp oil.   1 tsp trans-fat-free margarine.   1 tsp butter.   1 tsp mayonnaise.   2 tbs avocado.   1 tbs salad dressing.   1 tbs cream cheese.   2 tbs sour cream.  SAMPLE 1800 CALORIE DIET PLAN  Breakfast    cup unsweetened cereal (1 carb serving).   1 cup fat-free milk (1 carb serving).   1 slice whole-wheat toast (1 carb serving).    small banana (1 carb serving).   1 scrambled egg.   1 tsp trans-fat-free margarine.  Lunch   Tuna sandwich.   2 slices whole-wheat bread (2 carb servings).    cup canned tuna in water, drained.   1 tbs reduced fat mayonnaise.   1 stalk celery, chopped.   2 slices tomato.   1 lettuce leaf.   1 cup carrot sticks.    24 to 30 seedless grapes (2 carb servings).   6 oz light yogurt (1 carb serving).  Afternoon Snack   3 graham cracker squares (1 carb serving).   Fat-free milk, 1 cup (1 carb serving).   1 tbs peanut butter.  Dinner   3 oz salmon, broiled with 1 tsp oil.   1 cup mashed potatoes (2 carb servings) with 1 tsp trans-fat-free margarine.   1 cup fresh or frozen green beans.   1 cup steamed asparagus.   1 cup fat-free milk (1 carb serving).  Evening Snack   3 cups air-popped popcorn (1 carb serving).   2 tbs parmesan cheese sprinkled on top.  MEAL PLAN  Use this worksheet to help you make a daily meal plan based on the 1800 calorie diet suggestions. If you are using this plan to help you control your blood glucose, you may interchange carbohydrate-containing foods (dairy, starches, and fruits). Select a variety of fresh foods of varying colors and flavors. The total amount of carbohydrate in your meals or snacks is more important than making sure you include all of the food groups every time you eat. Choose from the following foods to build your day's meals:   8 Starches.   4 Vegetables.   3 Fruits.   2 Dairy.   6 to 7 oz Meat/Protein.   Up to 4 Fats.  Your dietician can use this worksheet to help you decide how many servings and which types of foods are right for you.  BREAKFAST  Food Group and Servings / Food Choice  Starch ________________________________________________________  Dairy _________________________________________________________  Fruit _________________________________________________________  Meat/Protein __________________________________________________  Fat ___________________________________________________________  LUNCH  Food Group and Servings / Food Choice  Starch ________________________________________________________  Meat/Protein __________________________________________________  Vegetable _____________________________________________________   Fruit _________________________________________________________  Dairy _________________________________________________________  Fat ___________________________________________________________  AFTERNOON SNACK  Food Group and Servings / Food Choice  Starch ________________________________________________________  Meat/Protein __________________________________________________  Fruit __________________________________________________________  Dairy _________________________________________________________  DINNER  Food Group and Servings / Food Choice  Starch _________________________________________________________  Meat/Protein ___________________________________________________  Dairy __________________________________________________________  Vegetable ______________________________________________________  Fruit ___________________________________________________________  Fat ____________________________________________________________  EVENING SNACK  Food Group and Servings / Food Choice  Fruit __________________________________________________________  Meat/Protein ___________________________________________________  Dairy __________________________________________________________  Starch _________________________________________________________  DAILY TOTALS  Starch ____________________________  Vegetable _________________________  Fruit _____________________________  Dairy _____________________________  Meat/Protein______________________  Fat _______________________________  Document Released: 01/07/2005 Document Revised: 09/09/2011 Document Reviewed: 05/03/2011  [employer] Patient Information 2013   [employer]

## 2012-11-18 LAB — BASIC METABOLIC PANEL
BUN: 16 mg/dL (ref 6–23)
Calcium: 10.2 mg/dL (ref 8.4–10.5)
Creat: 0.86 mg/dL (ref 0.50–1.10)
Glucose, Bld: 174 mg/dL — ABNORMAL HIGH (ref 70–99)
Potassium: 4.5 mEq/L (ref 3.5–5.3)

## 2012-11-18 LAB — HEMOGLOBIN A1C: Hgb A1c MFr Bld: 7.9 % — ABNORMAL HIGH (ref <5.7)

## 2012-12-15 ENCOUNTER — Ambulatory Visit: Payer: No Typology Code available for payment source

## 2013-12-15 ENCOUNTER — Encounter: Payer: Self-pay | Admitting: Internal Medicine

## 2013-12-15 ENCOUNTER — Ambulatory Visit: Payer: No Typology Code available for payment source | Attending: Internal Medicine | Admitting: Internal Medicine

## 2013-12-15 VITALS — BP 138/84 | HR 84 | Temp 98.5°F | Resp 16 | Ht 62.0 in | Wt 136.0 lb

## 2013-12-15 DIAGNOSIS — M1990 Unspecified osteoarthritis, unspecified site: Secondary | ICD-10-CM | POA: Insufficient documentation

## 2013-12-15 DIAGNOSIS — K2900 Acute gastritis without bleeding: Secondary | ICD-10-CM | POA: Insufficient documentation

## 2013-12-15 DIAGNOSIS — H409 Unspecified glaucoma: Secondary | ICD-10-CM | POA: Insufficient documentation

## 2013-12-15 DIAGNOSIS — Z79899 Other long term (current) drug therapy: Secondary | ICD-10-CM | POA: Insufficient documentation

## 2013-12-15 DIAGNOSIS — E860 Dehydration: Secondary | ICD-10-CM | POA: Insufficient documentation

## 2013-12-15 DIAGNOSIS — M549 Dorsalgia, unspecified: Secondary | ICD-10-CM | POA: Insufficient documentation

## 2013-12-15 DIAGNOSIS — K219 Gastro-esophageal reflux disease without esophagitis: Secondary | ICD-10-CM | POA: Insufficient documentation

## 2013-12-15 DIAGNOSIS — M129 Arthropathy, unspecified: Secondary | ICD-10-CM

## 2013-12-15 DIAGNOSIS — R109 Unspecified abdominal pain: Secondary | ICD-10-CM | POA: Insufficient documentation

## 2013-12-15 DIAGNOSIS — I10 Essential (primary) hypertension: Secondary | ICD-10-CM | POA: Insufficient documentation

## 2013-12-15 DIAGNOSIS — E119 Type 2 diabetes mellitus without complications: Secondary | ICD-10-CM | POA: Insufficient documentation

## 2013-12-15 LAB — CBC WITH DIFFERENTIAL/PLATELET
BASOS PCT: 0 % (ref 0–1)
Basophils Absolute: 0 10*3/uL (ref 0.0–0.1)
Eosinophils Absolute: 0.1 10*3/uL (ref 0.0–0.7)
Eosinophils Relative: 1 % (ref 0–5)
HCT: 40.2 % (ref 36.0–46.0)
HEMOGLOBIN: 13.9 g/dL (ref 12.0–15.0)
LYMPHS ABS: 2.7 10*3/uL (ref 0.7–4.0)
Lymphocytes Relative: 37 % (ref 12–46)
MCH: 29.5 pg (ref 26.0–34.0)
MCHC: 34.6 g/dL (ref 30.0–36.0)
MCV: 85.4 fL (ref 78.0–100.0)
MONOS PCT: 5 % (ref 3–12)
Monocytes Absolute: 0.4 10*3/uL (ref 0.1–1.0)
NEUTROS ABS: 4.2 10*3/uL (ref 1.7–7.7)
NEUTROS PCT: 57 % (ref 43–77)
PLATELETS: 323 10*3/uL (ref 150–400)
RBC: 4.71 MIL/uL (ref 3.87–5.11)
RDW: 12.7 % (ref 11.5–15.5)
WBC: 7.4 10*3/uL (ref 4.0–10.5)

## 2013-12-15 LAB — POCT GLYCOSYLATED HEMOGLOBIN (HGB A1C): HEMOGLOBIN A1C: 9

## 2013-12-15 LAB — GLUCOSE, POCT (MANUAL RESULT ENTRY): POC GLUCOSE: 327 mg/dL — AB (ref 70–99)

## 2013-12-15 MED ORDER — INSULIN ASPART 100 UNIT/ML ~~LOC~~ SOLN
20.0000 [IU] | Freq: Once | SUBCUTANEOUS | Status: AC
  Administered 2013-12-15: 20 [IU] via SUBCUTANEOUS

## 2013-12-15 MED ORDER — PANTOPRAZOLE SODIUM 40 MG PO TBEC: 40.0000 mg | DELAYED_RELEASE_TABLET | Freq: Every day | ORAL | Status: DC

## 2013-12-15 MED ORDER — GI COCKTAIL ~~LOC~~
10.0000 mL | Freq: Once | ORAL | Status: AC
  Administered 2013-12-15: 10 mL via ORAL

## 2013-12-15 MED ORDER — GLIMEPIRIDE 4 MG PO TABS: 4.0000 mg | ORAL_TABLET | Freq: Every day | ORAL | Status: DC

## 2013-12-15 MED ORDER — PRAVASTATIN SODIUM 40 MG PO TABS: 40.0000 mg | ORAL_TABLET | Freq: Every day | ORAL | Status: DC

## 2013-12-15 MED ORDER — SODIUM CHLORIDE 0.9 % IJ SOLN
1000.0000 mL | Freq: Once | INTRAMUSCULAR | Status: AC
  Administered 2013-12-15: 1000 mL via INTRAVENOUS

## 2013-12-15 MED ORDER — LISINOPRIL 10 MG PO TABS: 10.0000 mg | ORAL_TABLET | Freq: Every day | ORAL | Status: DC

## 2013-12-15 MED ORDER — METFORMIN HCL 1000 MG PO TABS: 1000.0000 mg | ORAL_TABLET | Freq: Two times a day (BID) | ORAL | Status: DC

## 2013-12-15 NOTE — Progress Notes (Signed)
Pt is here today following up on her Diabetes and HTN Pt states that she is in terrible pain in her upper abdomen and pain in her mid back. She has been throwing up for the past 3 days. Pt's daughter is her interpreter. pts right eye seems to be infected.

## 2013-12-15 NOTE — Progress Notes (Signed)
Patient ID: Tina Phelps, female   DOB: 06-Oct-1945, 68 y.o.   MRN: [id number]   Tina Phelps, is a 68 y.o. female  YWV:[id number]  [id number]  DOB - Sep 10, 1945  Chief Complaint  Patient presents with  . Follow-up  . Establish Care        Subjective:   Tina Phelps is a 68 y.o. female here today for a follow up visit. Patient is known to have diabetes, hypertension, glaucoma being managed by ophthalmologist, GERD and osteoarthritis. She is here today for her routine follow-up. Pt states that she is in terrible pain in her upper abdomen and pain in her mid back. She has been throwing up for the past 3 days. Pt's daughter is her interpreter. Patient recently relocated to the [place] from [place]. Patient's daughter reported that her blood sugar has been uncontrolled, last hemoglobin A1c was 7.9% in May 2014. Patient has not been taking her medications, she definitely does not want injections like insulin. Patient does not smoke cigarettes, she does not drink alcohol. Patient has No headache, No chest pain, No new weakness tingling or numbness, No Cough - SOB.  Problem  Diabetes  Acute Gastritis  Dehydration    ALLERGIES: No Known Allergies  PAST MEDICAL HISTORY: Past Medical History  Diagnosis Date  . Diabetes mellitus without complication   . Glaucoma   . Hypertension   . Arthritis     MEDICATIONS AT HOME: Prior to Admission medications   Medication Sig Start Date End Date Taking? Authorizing Provider  glimepiride (AMARYL) 4 MG tablet Take 1 tablet (4 mg total) by mouth daily before breakfast. 12/15/13  Yes Angelica Chessman, MD  lisinopril (PRINIVIL,ZESTRIL) 10 MG tablet Take 1 tablet (10 mg total) by mouth daily. 12/15/13  Yes Angelica Chessman, MD  metFORMIN (GLUCOPHAGE) 1000 MG tablet Take 1 tablet (1,000 mg total) by mouth 2 (two) times daily with a meal. 12/15/13  Yes Angelica Chessman, MD  pravastatin (PRAVACHOL) 40 MG tablet Take 1 tablet (40 mg total) by mouth  daily. 12/15/13  Yes Angelica Chessman, MD  clindamycin (CLEOCIN) 300 MG capsule Take 1 capsule (300 mg total) by mouth 3 (three) times daily. 10/28/12   Acquanetta Chain, DO  dorzolamide-timolol (COSOPT) 22.3-6.8 MG/ML ophthalmic solution Place 1 drop into both eyes 2 (two) times daily. 11/17/12   Ripudeep Krystal Eaton, MD  pantoprazole (PROTONIX) 40 MG tablet Take 1 tablet (40 mg total) by mouth daily. 12/15/13   Angelica Chessman, MD  timolol (BETIMOL) 0.5 % ophthalmic solution Place 1 drop into both eyes at bedtime. 11/17/12   Ripudeep Krystal Eaton, MD  traMADol (ULTRAM) 50 MG tablet Take 1 tablet (50 mg total) by mouth every 6 (six) hours as needed for pain. 11/17/12   Ripudeep Krystal Eaton, MD     Objective:   Filed Vitals:   12/15/13 0922  BP: 138/84  Pulse: 84  Temp: 98.5 F (36.9 C)  TempSrc: Oral  Resp: 16  Height: 5\' 2"  (1.575 m)  Weight: 136 lb (61.689 kg)  SpO2: 99%    Exam General appearance : Awake, alert, not in any distress. Speech Clear. Not toxic looking but looks dehydrated HEENT: Atraumatic and Normocephalic, pupils equally reactive to light and accomodation Neck: supple, no JVD. No cervical lymphadenopathy.  Chest:Good air entry bilaterally, no added sounds  CVS: S1 S2 regular, no murmurs.  Abdomen: Bowel sounds present, Non tender and not distended with no gaurding, rigidity or rebound. Extremities: B/L Lower Ext shows no edema, both legs  are warm to touch Neurology: Awake alert, and oriented X 3, CN II-XII intact, Non focal   Data Review Lab Results  Component Value Date   HGBA1C 9.0 12/15/2013   HGBA1C 7.9* 11/17/2012   HGBA1C 11.4* 06/13/2010     Assessment & Plan   1. Diabetes  - Glucose (CBG) - HgB A1c is not 9% today  - insulin aspart (novoLOG) injection 20 Units; Inject 0.2 mLs (20 Units total) into the skin once. Restart metformin and increase the dose to 1000 mg daily by mouth twice a day, restart glimepiride  - metFORMIN (GLUCOPHAGE) 1000 MG tablet; Take 1  tablet (1,000 mg total) by mouth 2 (two) times daily with a meal.  Dispense: 180 tablet; Refill: 3 - glimepiride (AMARYL) 4 MG tablet; Take 1 tablet (4 mg total) by mouth daily before breakfast.  Dispense: 90 tablet; Refill: 3  2. Essential hypertension, benign  - pravastatin (PRAVACHOL) 40 MG tablet; Take 1 tablet (40 mg total) by mouth daily.  Dispense: 90 tablet; Refill: 3 - lisinopril (PRINIVIL,ZESTRIL) 10 MG tablet; Take 1 tablet (10 mg total) by mouth daily.  Dispense: 90 tablet; Refill: 3  3. Arthritis: No active pain today No NSAID place  4. Acute gastritis  - Lipase - Amylase - gi cocktail (Maalox,Lidocaine,Donnatal); Take 10 mLs by mouth once. - pantoprazole (PROTONIX) 40 MG tablet; Take 1 tablet (40 mg total) by mouth daily.  Dispense: 30 tablet; Refill: 3  5. Dehydration  - CBC with Differential - COMPLETE METABOLIC PANEL WITH GFR - Lipid panel - Urinalysis, Complete - sodium chloride 0.9 % injection 1,000 mL; Inject 1,000 mLs into the vein once given for hyperglycemia and dehydration  Patient was counseled extensively about nutrition and exercise.  Interpreter was used to communicate directly with patient for the entire encounter including providing detailed patient instructions.   Return in about 4 weeks (around 01/12/2014), or if symptoms worsen or fail to improve, for Abdominal Pain, Follow up Pain and comorbidities.  The patient was given clear instructions to go to ER or return to medical center if symptoms don't improve, worsen or new problems develop. The patient verbalized understanding. The patient was told to call to get lab results if they haven't heard anything in the next week.   This note has been created with Surveyor, quantity. Any transcriptional errors are unintentional.    Angelica Chessman, MD, [place], [place], [place] and [place], [place]   12/15/2013,  5:55 PM

## 2013-12-16 LAB — COMPLETE METABOLIC PANEL WITH GFR
ALBUMIN: 4.3 g/dL (ref 3.5–5.2)
ALK PHOS: 97 U/L (ref 39–117)
ALT: 13 U/L (ref 0–35)
AST: 17 U/L (ref 0–37)
BILIRUBIN TOTAL: 0.7 mg/dL (ref 0.2–1.2)
BUN: 13 mg/dL (ref 6–23)
CO2: 24 mEq/L (ref 19–32)
Calcium: 10 mg/dL (ref 8.4–10.5)
Chloride: 97 mEq/L (ref 96–112)
Creat: 0.69 mg/dL (ref 0.50–1.10)
GFR, Est African American: >89 mL/min
GLUCOSE: 283 mg/dL — AB (ref 70–99)
POTASSIUM: 3.9 meq/L (ref 3.5–5.3)
SODIUM: 133 meq/L — AB (ref 135–145)
TOTAL PROTEIN: 7.5 g/dL (ref 6.0–8.3)

## 2013-12-16 LAB — URINALYSIS, COMPLETE
Bilirubin Urine: NEGATIVE
Casts: NONE SEEN
Crystals: NONE SEEN
Glucose, UA: 500 mg/dL — AB
HGB URINE DIPSTICK: NEGATIVE
NITRITE: NEGATIVE
PROTEIN: NEGATIVE mg/dL
Specific Gravity, Urine: 1.026 (ref 1.005–1.030)
UROBILINOGEN UA: 0.2 mg/dL (ref 0.0–1.0)
pH: 5 (ref 5.0–8.0)

## 2013-12-16 LAB — LIPID PANEL
CHOL/HDL RATIO: 3.2 ratio
Cholesterol: 214 mg/dL — ABNORMAL HIGH (ref 0–200)
HDL: 67 mg/dL (ref >39)
LDL CALC: 121 mg/dL — AB (ref 0–99)
Triglycerides: 130 mg/dL (ref <150)
VLDL: 26 mg/dL (ref 0–40)

## 2013-12-16 LAB — AMYLASE: AMYLASE: 72 U/L (ref 0–105)

## 2013-12-16 LAB — LIPASE: LIPASE: 35 U/L (ref 0–75)

## 2013-12-20 ENCOUNTER — Telehealth: Payer: Self-pay | Admitting: Emergency Medicine

## 2013-12-20 NOTE — Telephone Encounter (Signed)
Message copied by Ricci Barker on Mon Dec 20, 2013  5:28 PM ------      Message from: Tresa Garter      Created: Sun Dec 19, 2013  4:37 PM       Please inform patient that her laboratory tests results are mostly within normal limits except for blood glucose, will need to continue firm control of blood sugar with current regimen, we'll also advice low cholesterol diet and low-fat diet to control the slightly high cholesterol level in addition to the current cholesterol medication ------

## 2014-01-13 ENCOUNTER — Ambulatory Visit: Payer: No Typology Code available for payment source | Attending: Internal Medicine | Admitting: Internal Medicine

## 2014-01-13 ENCOUNTER — Encounter: Payer: Self-pay | Admitting: Internal Medicine

## 2014-01-13 VITALS — BP 148/83 | HR 78 | Temp 98.8°F | Resp 16

## 2014-01-13 DIAGNOSIS — E119 Type 2 diabetes mellitus without complications: Secondary | ICD-10-CM | POA: Insufficient documentation

## 2014-01-13 DIAGNOSIS — E139 Other specified diabetes mellitus without complications: Secondary | ICD-10-CM

## 2014-01-13 DIAGNOSIS — I10 Essential (primary) hypertension: Secondary | ICD-10-CM

## 2014-01-13 DIAGNOSIS — E785 Hyperlipidemia, unspecified: Secondary | ICD-10-CM

## 2014-01-13 DIAGNOSIS — K219 Gastro-esophageal reflux disease without esophagitis: Secondary | ICD-10-CM

## 2014-01-13 DIAGNOSIS — E089 Diabetes mellitus due to underlying condition without complications: Secondary | ICD-10-CM

## 2014-01-13 LAB — GLUCOSE, POCT (MANUAL RESULT ENTRY): POC GLUCOSE: 174 mg/dL — AB (ref 70–99)

## 2014-01-13 NOTE — Patient Instructions (Addendum)
DASH Eating Plan DASH stands for "Dietary Approaches to Stop Hypertension." The DASH eating plan is a healthy eating plan that has been shown to reduce high blood pressure (hypertension). Additional health benefits may include reducing the risk of type 2 diabetes mellitus, heart disease, and stroke. The DASH eating plan may also help with weight loss. WHAT DO I NEED TO KNOW ABOUT THE DASH EATING PLAN? For the DASH eating plan, you will follow these general guidelines:  Choose foods with a percent daily value for sodium of less than 5% (as listed on the food label).  Use salt-free seasonings or herbs instead of table salt or sea salt.  Check with your health care provider or pharmacist before using salt substitutes.  Eat lower-sodium products, often labeled as "lower sodium" or "no salt added."  Eat fresh foods.  Eat more vegetables, fruits, and low-fat dairy products.  Choose whole grains. Look for the word "whole" as the first word in the ingredient list.  Choose fish and skinless chicken or turkey more often than red meat. Limit fish, poultry, and meat to 6 oz (170 g) each day.  Limit sweets, desserts, sugars, and sugary drinks.  Choose heart-healthy fats.  Limit cheese to 1 oz (28 g) per day.  Eat more home-cooked food and less restaurant, buffet, and fast food.  Limit fried foods.  Cook foods using methods other than frying.  Limit canned vegetables. If you do use them, rinse them well to decrease the sodium.  When eating at a restaurant, ask that your food be prepared with less salt, or no salt if possible. WHAT FOODS CAN I EAT? Seek help from a dietitian for individual calorie needs. Grains Whole grain or whole wheat bread. Brown rice. Whole grain or whole wheat pasta. Quinoa, bulgur, and whole grain cereals. Low-sodium cereals. Corn or whole wheat flour tortillas. Whole grain cornbread. Whole grain crackers. Low-sodium crackers. Vegetables Fresh or frozen vegetables  (raw, steamed, roasted, or grilled). Low-sodium or reduced-sodium tomato and vegetable juices. Low-sodium or reduced-sodium tomato sauce and paste. Low-sodium or reduced-sodium canned vegetables.  Fruits All fresh, canned (in natural juice), or frozen fruits. Meat and Other Protein Products Ground beef (85% or leaner), grass-fed beef, or beef trimmed of fat. Skinless chicken or turkey. Ground chicken or turkey. Pork trimmed of fat. All fish and seafood. Eggs. Dried beans, peas, or lentils. Unsalted nuts and seeds. Unsalted canned beans. Dairy Low-fat dairy products, such as skim or 1% milk, 2% or reduced-fat cheeses, low-fat ricotta or cottage cheese, or plain low-fat yogurt. Low-sodium or reduced-sodium cheeses. Fats and Oils Tub margarines without trans fats. Light or reduced-fat mayonnaise and salad dressings (reduced sodium). Avocado. Safflower, olive, or canola oils. Natural peanut or almond butter. Other Unsalted popcorn and pretzels. The items listed above may not be a complete list of recommended foods or beverages. Contact your dietitian for more options. WHAT FOODS ARE NOT RECOMMENDED? Grains White bread. White pasta. White rice. Refined cornbread. Bagels and croissants. Crackers that contain trans fat. Vegetables Creamed or fried vegetables. Vegetables in a cheese sauce. Regular canned vegetables. Regular canned tomato sauce and paste. Regular tomato and vegetable juices. Fruits Dried fruits. Canned fruit in light or heavy syrup. Fruit juice. Meat and Other Protein Products Fatty cuts of meat. Ribs, chicken wings, bacon, sausage, bologna, salami, chitterlings, fatback, hot dogs, bratwurst, and packaged luncheon meats. Salted nuts and seeds. Canned beans with salt. Dairy Whole or 2% milk, cream, half-and-half, and cream cheese. Whole-fat or sweetened yogurt. Full-fat   cheeses or blue cheese. Nondairy creamers and whipped toppings. Processed cheese, cheese spreads, or cheese  curds. Condiments Onion and garlic salt, seasoned salt, table salt, and sea salt. Canned and packaged gravies. Worcestershire sauce. Tartar sauce. Barbecue sauce. Teriyaki sauce. Soy sauce, including reduced sodium. Steak sauce. Fish sauce. Oyster sauce. Cocktail sauce. Horseradish. Ketchup and mustard. Meat flavorings and tenderizers. Bouillon cubes. Hot sauce. Tabasco sauce. Marinades. Taco seasonings. Relishes. Fats and Oils Butter, stick margarine, lard, shortening, ghee, and bacon fat. Coconut, palm kernel, or palm oils. Regular salad dressings. Other Pickles and olives. Salted popcorn and pretzels. The items listed above may not be a complete list of foods and beverages to avoid. Contact your dietitian for more information. WHERE CAN I FIND MORE INFORMATION? National Heart, Lung, and Blood Institute: travelstabloid.com Document Released: 06/06/2011 Document Revised: 06/22/2013 Document Reviewed: 04/21/2013 [facility] Patient Information 2015 [place], [place]. This information is not intended to replace advice given to you by your health care provider. Make sure you discuss any questions you have with your health care provider. Diabetes Mellitus and Food It is important for you to manage your blood sugar (glucose) level. Your blood glucose level can be greatly affected by what you eat. Eating healthier foods in the appropriate amounts throughout the day at about the same time each day will help you control your blood glucose level. It can also help slow or prevent worsening of your diabetes mellitus. Healthy eating may even help you improve the level of your blood pressure and reach or maintain a healthy weight.  HOW CAN FOOD AFFECT ME? Carbohydrates Carbohydrates affect your blood glucose level more than any other type of food. Your dietitian will help you determine how many carbohydrates to eat at each meal and teach you how to count carbohydrates. Counting  carbohydrates is important to keep your blood glucose at a healthy level, especially if you are using insulin or taking certain medicines for diabetes mellitus. Alcohol Alcohol can cause sudden decreases in blood glucose (hypoglycemia), especially if you use insulin or take certain medicines for diabetes mellitus. Hypoglycemia can be a life-threatening condition. Symptoms of hypoglycemia (sleepiness, dizziness, and disorientation) are similar to symptoms of having too much alcohol.  If your health care provider has given you approval to drink alcohol, do so in moderation and use the following guidelines:  Women should not have more than one drink per day, and men should not have more than two drinks per day. One drink is equal to:  12 oz of beer.  5 oz of wine.  1 oz of hard liquor.  Do not drink on an empty stomach.  Keep yourself hydrated. Have water, diet soda, or unsweetened iced tea.  Regular soda, juice, and other mixers might contain a lot of carbohydrates and should be counted. WHAT FOODS ARE NOT RECOMMENDED? As you make food choices, it is important to remember that all foods are not the same. Some foods have fewer nutrients per serving than other foods, even though they might have the same number of calories or carbohydrates. It is difficult to get your body what it needs when you eat foods with fewer nutrients. Examples of foods that you should avoid that are high in calories and carbohydrates but low in nutrients include:  Trans fats (most processed foods list trans fats on the Nutrition Facts label).  Regular soda.  Juice.  Candy.  Sweets, such as cake, pie, doughnuts, and cookies.  Fried foods. WHAT FOODS CAN I EAT? Have nutrient-rich foods,  which will nourish your body and keep you healthy. The food you should eat also will depend on several factors, including:  The calories you need.  The medicines you take.  Your weight.  Your blood glucose level.  Your  blood pressure level.  Your cholesterol level. You also should eat a variety of foods, including:  Protein, such as meat, poultry, fish, tofu, nuts, and seeds (lean animal proteins are best).  Fruits.  Vegetables.  Dairy products, such as milk, cheese, and yogurt (low fat is best).  Breads, grains, pasta, cereal, rice, and beans.  Fats such as olive oil, trans fat-free margarine, canola oil, avocado, and olives. DOES EVERYONE WITH DIABETES MELLITUS HAVE THE SAME MEAL PLAN? Because every person with diabetes mellitus is different, there is not one meal plan that works for everyone. It is very important that you meet with a dietitian who will help you create a meal plan that is just right for you. Document Released: 03/14/2005 Document Revised: 06/22/2013 Document Reviewed: 05/14/2013 [employer] Patient Information 2015 [employer]. This information is not intended to replace advice given to you by your health care provider. Make sure you discuss any questions you have with your health care provider.  

## 2014-01-13 NOTE — Progress Notes (Signed)
MRN: [id number] Name: Tina Phelps  Sex: female Age: 68 y.o. DOB: [birth year]  Allergies: Review of patient's allergies indicates no known allergies.  Chief Complaint  Patient presents with  . Follow-up    HPI: Patient is 68 y.o. female who has to of diabetes hypertension hyperlipidemia comes today for followup, since the last month she has been started back on metformin and Amaryl and she reports some improvement in her fingerstick glucose at home, patient denies any acute symptoms denies any headache dizziness chest and shortness of breath, patient had a blood work done which was reviewed the patient.   Past Medical History  Diagnosis Date  . Diabetes mellitus without complication   . Glaucoma   . Hypertension   . Arthritis     Past Surgical History  Procedure Laterality Date  . Cholecystectomy    . Nephrectomy Right   . Oophorectomy Right       Medication List       This list is accurate as of: 01/13/14 12:41 PM.  Always use your most recent med list.               clindamycin 300 MG capsule  Commonly known as:  CLEOCIN  Take 1 capsule (300 mg total) by mouth 3 (three) times daily.     dorzolamide-timolol 22.3-6.8 MG/ML ophthalmic solution  Commonly known as:  COSOPT  Place 1 drop into both eyes 2 (two) times daily.     glimepiride 4 MG tablet  Commonly known as:  AMARYL  Take 1 tablet (4 mg total) by mouth daily before breakfast.     lisinopril 10 MG tablet  Commonly known as:  PRINIVIL,ZESTRIL  Take 1 tablet (10 mg total) by mouth daily.     metFORMIN 1000 MG tablet  Commonly known as:  GLUCOPHAGE  Take 1 tablet (1,000 mg total) by mouth 2 (two) times daily with a meal.     pantoprazole 40 MG tablet  Commonly known as:  PROTONIX  Take 1 tablet (40 mg total) by mouth daily.     pravastatin 40 MG tablet  Commonly known as:  PRAVACHOL  Take 1 tablet (40 mg total) by mouth daily.     timolol 0.5 % ophthalmic solution  Commonly known as:  BETIMOL    Place 1 drop into both eyes at bedtime.     traMADol 50 MG tablet  Commonly known as:  ULTRAM  Take 1 tablet (50 mg total) by mouth every 6 (six) hours as needed for pain.        No orders of the defined types were placed in this encounter.     There is no immunization history on file for this patient.  Family History  Problem Relation Age of Onset  . Diabetes Sister   . Diabetes Brother   . Diabetes Daughter     History  Substance Use Topics  . Smoking status: Never Smoker   . Smokeless tobacco: Not on file  . Alcohol Use: No    Review of Systems   As noted in HPI  Filed Vitals:   01/13/14 1216  BP: 148/83  Pulse: 78  Temp: 98.8 F (37.1 C)  Resp: 16    Physical Exam  Physical Exam  Constitutional: No distress.  Cardiovascular: Normal rate and regular rhythm.   Pulmonary/Chest: Breath sounds normal. No respiratory distress. She has no wheezes. She has no rales.  Musculoskeletal: She exhibits no edema.    CBC  Component Value Date/Time   WBC 7.4 12/15/2013 1011   RBC 4.71 12/15/2013 1011   HGB 13.9 12/15/2013 1011   HCT 40.2 12/15/2013 1011   PLT 323 12/15/2013 1011   MCV 85.4 12/15/2013 1011   LYMPHSABS 2.7 12/15/2013 1011   MONOABS 0.4 12/15/2013 1011   EOSABS 0.1 12/15/2013 1011   BASOSABS 0.0 12/15/2013 1011    CMP     Component Value Date/Time   NA 133* 12/15/2013 1011   K 3.9 12/15/2013 1011   CL 97 12/15/2013 1011   CO2 24 12/15/2013 1011   GLUCOSE 283* 12/15/2013 1011   BUN 13 12/15/2013 1011   CREATININE 0.69 12/15/2013 1011   CREATININE 0.79 08/29/2010 2025   CALCIUM 10.0 12/15/2013 1011   PROT 7.5 12/15/2013 1011   ALBUMIN 4.3 12/15/2013 1011   AST 17 12/15/2013 1011   ALT 13 12/15/2013 1011   ALKPHOS 97 12/15/2013 1011   BILITOT 0.7 12/15/2013 1011   GFRNONAA >89 12/15/2013 1011   GFRNONAA >60 08/03/2010 1100   GFRAA >89 12/15/2013 1011   GFRAA  Value: >60        The eGFR has been calculated using the MDRD equation. This calculation has not  been validated in all clinical situations. eGFR's persistently <60 mL/min signify possible Chronic Kidney Disease. 08/03/2010 1100    Lab Results  Component Value Date/Time   CHOL 214* 12/15/2013 10:11 AM    No components found with this basename: hga1c    Lab Results  Component Value Date/Time   AST 17 12/15/2013 10:11 AM    Assessment and Plan  Diabetes mellitus due to underlying condition without complications - Plan: Results for orders placed in visit on 01/13/14  GLUCOSE, POCT (MANUAL RESULT ENTRY)      Result Value Ref Range   POC Glucose 174 (*) 70 - 99 mg/dl   I have reinforced diet modification with the diabetes meal planning, she'll continue with metformin and Amaryl we'll recheck A1c on the next visit.  Essential hypertension, benign Borderline elevated advised for DASH diet, continue with lisinopril 10 mg.  Other and unspecified hyperlipidemia Patient is on Pravachol, will repeat lipid panel on the next visit, also advise for low fat diet.  Gastroesophageal reflux disease without esophagitis  patient is on Protonix symptoms are stable, advised for lifestyle modification.    Return in about 3 months (around 04/15/2014) for diabetes, hypertension.  Lorayne Marek, MD

## 2014-01-13 NOTE — Progress Notes (Signed)
Patient here for follow up on her DM 

## 2014-01-18 ENCOUNTER — Ambulatory Visit: Payer: No Typology Code available for payment source | Attending: Internal Medicine

## 2014-06-08 ENCOUNTER — Encounter: Payer: Self-pay | Admitting: Internal Medicine

## 2014-06-08 ENCOUNTER — Ambulatory Visit: Payer: No Typology Code available for payment source | Attending: Internal Medicine | Admitting: Internal Medicine

## 2014-06-08 VITALS — BP 135/72 | HR 72 | Temp 97.8°F | Resp 16 | Wt 138.8 lb

## 2014-06-08 DIAGNOSIS — I10 Essential (primary) hypertension: Secondary | ICD-10-CM

## 2014-06-08 DIAGNOSIS — Z09 Encounter for follow-up examination after completed treatment for conditions other than malignant neoplasm: Secondary | ICD-10-CM | POA: Insufficient documentation

## 2014-06-08 DIAGNOSIS — Z9049 Acquired absence of other specified parts of digestive tract: Secondary | ICD-10-CM | POA: Insufficient documentation

## 2014-06-08 DIAGNOSIS — H409 Unspecified glaucoma: Secondary | ICD-10-CM | POA: Insufficient documentation

## 2014-06-08 DIAGNOSIS — E119 Type 2 diabetes mellitus without complications: Secondary | ICD-10-CM | POA: Insufficient documentation

## 2014-06-08 DIAGNOSIS — E785 Hyperlipidemia, unspecified: Secondary | ICD-10-CM

## 2014-06-08 DIAGNOSIS — Z79899 Other long term (current) drug therapy: Secondary | ICD-10-CM | POA: Insufficient documentation

## 2014-06-08 DIAGNOSIS — E139 Other specified diabetes mellitus without complications: Secondary | ICD-10-CM

## 2014-06-08 LAB — GLUCOSE, POCT (MANUAL RESULT ENTRY): POC Glucose: 116 mg/dl — AB (ref 70–99)

## 2014-06-08 LAB — POCT GLYCOSYLATED HEMOGLOBIN (HGB A1C): HEMOGLOBIN A1C: 9

## 2014-06-08 MED ORDER — SITAGLIPTIN PHOS-METFORMIN HCL 50-1000 MG PO TABS: 1.0000 | ORAL_TABLET | Freq: Two times a day (BID) | ORAL | Status: DC

## 2014-06-08 MED ORDER — LISINOPRIL 10 MG PO TABS: 10.0000 mg | ORAL_TABLET | Freq: Every day | ORAL | Status: DC

## 2014-06-08 MED ORDER — GLIMEPIRIDE 4 MG PO TABS: 4.0000 mg | ORAL_TABLET | Freq: Every day | ORAL | Status: DC

## 2014-06-08 MED ORDER — PRAVASTATIN SODIUM 40 MG PO TABS: 40.0000 mg | ORAL_TABLET | Freq: Every day | ORAL | Status: DC

## 2014-06-08 NOTE — Progress Notes (Signed)
Patient here for follow up on her diabetes and general check up

## 2014-06-08 NOTE — Progress Notes (Signed)
MRN: [id number] Name: Tina Phelps  Sex: female Age: 68 y.o. DOB: 19-Aug-1945  Allergies: Review of patient's allergies indicates no known allergies.  Chief Complaint  Patient presents with  . Follow-up    HPI: Patient is 68 y.o. female who history of diabetes hypertension and lipidemia comes today for followup, patient denies any acute symptoms and is requesting refill on her medications, apparently she also history of glaucoma follows up with the ophthalmologist, she denies any hypoglycemic symptoms, today her hemoglobin A1c is 9.0% not improved compared to last visit, currently she is taking metformin thousand milligram twice a day as well as and Amaryl 4 mg daily.  Past Medical History  Diagnosis Date  . Diabetes mellitus without complication   . Glaucoma   . Hypertension   . Arthritis     Past Surgical History  Procedure Laterality Date  . Cholecystectomy    . Nephrectomy Right   . Oophorectomy Right       Medication List       This list is accurate as of: 06/08/14  4:48 PM.  Always use your most recent med list.               clindamycin 300 MG capsule  Commonly known as:  CLEOCIN  Take 1 capsule (300 mg total) by mouth 3 (three) times daily.     dorzolamide-timolol 22.3-6.8 MG/ML ophthalmic solution  Commonly known as:  COSOPT  Place 1 drop into both eyes 2 (two) times daily.     glimepiride 4 MG tablet  Commonly known as:  AMARYL  Take 1 tablet (4 mg total) by mouth daily before breakfast.     lisinopril 10 MG tablet  Commonly known as:  PRINIVIL,ZESTRIL  Take 1 tablet (10 mg total) by mouth daily.     metFORMIN 1000 MG tablet  Commonly known as:  GLUCOPHAGE  Take 1 tablet (1,000 mg total) by mouth 2 (two) times daily with a meal.     pantoprazole 40 MG tablet  Commonly known as:  PROTONIX  Take 1 tablet (40 mg total) by mouth daily.     pravastatin 40 MG tablet  Commonly known as:  PRAVACHOL  Take 1 tablet (40 mg total) by mouth daily.     sitaGLIPtin-metformin 50-1000 MG per tablet  Commonly known as:  JANUMET  Take 1 tablet by mouth 2 (two) times daily with a meal.     timolol 0.5 % ophthalmic solution  Commonly known as:  BETIMOL  Place 1 drop into both eyes at bedtime.     traMADol 50 MG tablet  Commonly known as:  ULTRAM  Take 1 tablet (50 mg total) by mouth every 6 (six) hours as needed for pain.        Meds ordered this encounter  Medications  . lisinopril (PRINIVIL,ZESTRIL) 10 MG tablet    Sig: Take 1 tablet (10 mg total) by mouth daily.    Dispense:  90 tablet    Refill:  3  . pravastatin (PRAVACHOL) 40 MG tablet    Sig: Take 1 tablet (40 mg total) by mouth daily.    Dispense:  90 tablet    Refill:  3  . glimepiride (AMARYL) 4 MG tablet    Sig: Take 1 tablet (4 mg total) by mouth daily before breakfast.    Dispense:  90 tablet    Refill:  3  . sitaGLIPtin-metformin (JANUMET) 50-1000 MG per tablet    Sig: Take 1 tablet by mouth  2 (two) times daily with a meal.    Dispense:  60 tablet    Refill:  3     There is no immunization history on file for this patient.  Family History  Problem Relation Age of Onset  . Diabetes Sister   . Diabetes Brother   . Diabetes Daughter     History  Substance Use Topics  . Smoking status: Never Smoker   . Smokeless tobacco: Not on file  . Alcohol Use: No    Review of Systems   As noted in HPI  Filed Vitals:   06/08/14 1615  BP: 135/72  Pulse: 72  Temp: 97.8 F (36.6 C)  Resp: 16    Physical Exam  Physical Exam  Constitutional: She is oriented to person, place, and time.  HENT:  Head: Normocephalic and atraumatic.  Cardiovascular: Normal rate and regular rhythm.   Abdominal: Soft. She exhibits no distension. There is no tenderness. There is no rebound.  Musculoskeletal: She exhibits no edema.  Neurological: She is alert and oriented to person, place, and time.    CBC    Component Value Date/Time   WBC 7.4 12/15/2013 1011   RBC 4.71  12/15/2013 1011   HGB 13.9 12/15/2013 1011   HCT 40.2 12/15/2013 1011   PLT 323 12/15/2013 1011   MCV 85.4 12/15/2013 1011   LYMPHSABS 2.7 12/15/2013 1011   MONOABS 0.4 12/15/2013 1011   EOSABS 0.1 12/15/2013 1011   BASOSABS 0.0 12/15/2013 1011    CMP     Component Value Date/Time   NA 133* 12/15/2013 1011   K 3.9 12/15/2013 1011   CL 97 12/15/2013 1011   CO2 24 12/15/2013 1011   GLUCOSE 283* 12/15/2013 1011   BUN 13 12/15/2013 1011   CREATININE 0.69 12/15/2013 1011   CREATININE 0.79 08/29/2010 2025   CALCIUM 10.0 12/15/2013 1011   PROT 7.5 12/15/2013 1011   ALBUMIN 4.3 12/15/2013 1011   AST 17 12/15/2013 1011   ALT 13 12/15/2013 1011   ALKPHOS 97 12/15/2013 1011   BILITOT 0.7 12/15/2013 1011   GFRNONAA >89 12/15/2013 1011   GFRNONAA >60 08/03/2010 1100   GFRAA >89 12/15/2013 1011   GFRAA  08/03/2010 1100    >60        The eGFR has been calculated using the MDRD equation. This calculation has not been validated in all clinical situations. eGFR's persistently <60 mL/min signify possible Chronic Kidney Disease.    Lab Results  Component Value Date/Time   CHOL 214* 12/15/2013 10:11 AM    No components found for: HGA1C  Lab Results  Component Value Date/Time   AST 17 12/15/2013 10:11 AM    Assessment and Plan  Other specified diabetes mellitus without complications - Plan:  Results for orders placed or performed in visit on 06/08/14  Glucose (CBG)  Result Value Ref Range   POC Glucose 116 (A) 70 - 99 mg/dl  HgB A1c  Result Value Ref Range   Hemoglobin A1C 9.0     Diabetes is still uncontrolled, advised patient for diabetes planning, she'll continue with Amaryl, I have switched her metformin to Janumet, will repeat A1c in 3 months Glucose (CBG), HgB A1c, glimepiride (AMARYL) 4 MG tablet, sitaGLIPtin-metformin (JANUMET) 50-1000 MG per tablet  Essential hypertension, benign - Plan:Advised patient for DASH diet continue with lisinopril (PRINIVIL,ZESTRIL)  10 MG tablet, , COMPLETE METABOLIC PANEL WITH GFR  Hyperlipidemia Currently patient is on pravastatin (PRAVACHOL) 40 MG tablet, will repeat fasting lipid panel  on the next visit.  History of glaucoma, patient is following up with ophthalmology advised patient to check with her ophthalmologist for eye drops.  Health Maintenance Patient declines flu shot   Return in about 3 months (around 09/07/2014) for diabetes, hypertension.  Lorayne Marek, MD

## 2014-06-09 LAB — COMPLETE METABOLIC PANEL WITH GFR
ALK PHOS: 79 U/L (ref 39–117)
ALT: 11 U/L (ref 0–35)
AST: 15 U/L (ref 0–37)
Albumin: 4.1 g/dL (ref 3.5–5.2)
BILIRUBIN TOTAL: 0.5 mg/dL (ref 0.2–1.2)
BUN: 15 mg/dL (ref 6–23)
CO2: 26 meq/L (ref 19–32)
CREATININE: 0.76 mg/dL (ref 0.50–1.10)
Calcium: 9.9 mg/dL (ref 8.4–10.5)
Chloride: 104 mEq/L (ref 96–112)
GFR, EST NON AFRICAN AMERICAN: 82 mL/min
Glucose, Bld: 126 mg/dL — ABNORMAL HIGH (ref 70–99)
Potassium: 4.3 mEq/L (ref 3.5–5.3)
SODIUM: 138 meq/L (ref 135–145)
TOTAL PROTEIN: 6.9 g/dL (ref 6.0–8.3)

## 2014-06-14 ENCOUNTER — Other Ambulatory Visit: Payer: Self-pay

## 2014-06-14 DIAGNOSIS — E139 Other specified diabetes mellitus without complications: Secondary | ICD-10-CM

## 2014-06-14 MED ORDER — SITAGLIPTIN PHOS-METFORMIN HCL 50-1000 MG PO TABS: 1.0000 | ORAL_TABLET | Freq: Two times a day (BID) | ORAL | Status: DC

## 2014-09-30 ENCOUNTER — Ambulatory Visit: Payer: Medicaid Other | Attending: Internal Medicine | Admitting: Internal Medicine

## 2014-09-30 ENCOUNTER — Encounter: Payer: Self-pay | Admitting: Internal Medicine

## 2014-09-30 VITALS — BP 130/70 | HR 70 | Temp 98.0°F | Resp 16 | Wt 144.6 lb

## 2014-09-30 DIAGNOSIS — E785 Hyperlipidemia, unspecified: Secondary | ICD-10-CM | POA: Diagnosis not present

## 2014-09-30 DIAGNOSIS — Z905 Acquired absence of kidney: Secondary | ICD-10-CM | POA: Insufficient documentation

## 2014-09-30 DIAGNOSIS — I10 Essential (primary) hypertension: Secondary | ICD-10-CM

## 2014-09-30 DIAGNOSIS — Z9049 Acquired absence of other specified parts of digestive tract: Secondary | ICD-10-CM | POA: Insufficient documentation

## 2014-09-30 DIAGNOSIS — K219 Gastro-esophageal reflux disease without esophagitis: Secondary | ICD-10-CM | POA: Diagnosis not present

## 2014-09-30 DIAGNOSIS — Z792 Long term (current) use of antibiotics: Secondary | ICD-10-CM | POA: Diagnosis not present

## 2014-09-30 DIAGNOSIS — H409 Unspecified glaucoma: Secondary | ICD-10-CM | POA: Diagnosis not present

## 2014-09-30 DIAGNOSIS — E139 Other specified diabetes mellitus without complications: Secondary | ICD-10-CM

## 2014-09-30 DIAGNOSIS — Z90721 Acquired absence of ovaries, unilateral: Secondary | ICD-10-CM | POA: Insufficient documentation

## 2014-09-30 DIAGNOSIS — Z79899 Other long term (current) drug therapy: Secondary | ICD-10-CM | POA: Insufficient documentation

## 2014-09-30 DIAGNOSIS — E119 Type 2 diabetes mellitus without complications: Secondary | ICD-10-CM | POA: Insufficient documentation

## 2014-09-30 LAB — COMPLETE METABOLIC PANEL WITH GFR
ALBUMIN: 4.2 g/dL (ref 3.5–5.2)
ALT: 13 U/L (ref 0–35)
AST: 16 U/L (ref 0–37)
Alkaline Phosphatase: 103 U/L (ref 39–117)
BUN: 13 mg/dL (ref 6–23)
CALCIUM: 9.3 mg/dL (ref 8.4–10.5)
CHLORIDE: 102 meq/L (ref 96–112)
CO2: 26 meq/L (ref 19–32)
CREATININE: 0.81 mg/dL (ref 0.50–1.10)
GFR, EST AFRICAN AMERICAN: 88 mL/min
GFR, EST NON AFRICAN AMERICAN: 76 mL/min
GLUCOSE: 260 mg/dL — AB (ref 70–99)
Potassium: 4.1 mEq/L (ref 3.5–5.3)
Sodium: 138 mEq/L (ref 135–145)
Total Bilirubin: 0.4 mg/dL (ref 0.2–1.2)
Total Protein: 6.8 g/dL (ref 6.0–8.3)

## 2014-09-30 LAB — POCT GLYCOSYLATED HEMOGLOBIN (HGB A1C): HEMOGLOBIN A1C: 9.5

## 2014-09-30 LAB — GLUCOSE, POCT (MANUAL RESULT ENTRY): POC Glucose: 296 mg/dl — AB (ref 70–99)

## 2014-09-30 MED ORDER — GLIMEPIRIDE 4 MG PO TABS: 4.0000 mg | ORAL_TABLET | Freq: Two times a day (BID) | ORAL | Status: DC

## 2014-09-30 MED ORDER — LISINOPRIL 10 MG PO TABS: 10.0000 mg | ORAL_TABLET | Freq: Every day | ORAL | Status: DC

## 2014-09-30 MED ORDER — PANTOPRAZOLE SODIUM 40 MG PO TBEC: 40.0000 mg | DELAYED_RELEASE_TABLET | Freq: Every day | ORAL | Status: DC

## 2014-09-30 MED ORDER — PRAVASTATIN SODIUM 40 MG PO TABS: 40.0000 mg | ORAL_TABLET | Freq: Every day | ORAL | Status: DC

## 2014-09-30 MED ORDER — METFORMIN HCL 1000 MG PO TABS
1000.0000 mg | ORAL_TABLET | Freq: Two times a day (BID) | ORAL | Status: DC
Start: 2014-09-30 — End: 2015-05-02

## 2014-09-30 NOTE — Progress Notes (Signed)
MRN: [id number] Name: Tina Phelps  Sex: female Age: 69 y.o. DOB: [birth year]  Allergies: Review of patient's allergies indicates no known allergies.  Chief Complaint  Patient presents with  . Follow-up    HPI: Patient is 69 y.o. female who has history of diabetes hypertension , GERD,hyperlipidemia comes today for followup on the last visit she was switched to Janumet as per patient she  Does not like to take this medication and wants to switch back to meformin she is also taking amaryl 4 mg, she denies any hypoglycemic symptoms her her hemoglobin A1c has trended up from 9.0 to 9.5%, her blood pressure initially was elevated, repeat manual blood pressure is 130/70, she's taking lisinopril 10 mg daily.  Past Medical History  Diagnosis Date  . Diabetes mellitus without complication   . Glaucoma   . Hypertension   . Arthritis     Past Surgical History  Procedure Laterality Date  . Cholecystectomy    . Nephrectomy Right   . Oophorectomy Right       Medication List       This list is accurate as of: 09/30/14  5:50 PM.  Always use your most recent med list.               clindamycin 300 MG capsule  Commonly known as:  CLEOCIN  Take 1 capsule (300 mg total) by mouth 3 (three) times daily.     dorzolamide-timolol 22.3-6.8 MG/ML ophthalmic solution  Commonly known as:  COSOPT  Place 1 drop into both eyes 2 (two) times daily.     glimepiride 4 MG tablet  Commonly known as:  AMARYL  Take 1 tablet (4 mg total) by mouth 2 (two) times daily.     lisinopril 10 MG tablet  Commonly known as:  PRINIVIL,ZESTRIL  Take 1 tablet (10 mg total) by mouth daily.     metFORMIN 1000 MG tablet  Commonly known as:  GLUCOPHAGE  Take 1 tablet (1,000 mg total) by mouth 2 (two) times daily with a meal.     pantoprazole 40 MG tablet  Commonly known as:  PROTONIX  Take 1 tablet (40 mg total) by mouth daily.     pravastatin 40 MG tablet  Commonly known as:  PRAVACHOL  Take 1 tablet (40  mg total) by mouth daily.     sitaGLIPtin-metformin 50-1000 MG per tablet  Commonly known as:  JANUMET  Take 1 tablet by mouth 2 (two) times daily with a meal.     timolol 0.5 % ophthalmic solution  Commonly known as:  BETIMOL  Place 1 drop into both eyes at bedtime.     traMADol 50 MG tablet  Commonly known as:  ULTRAM  Take 1 tablet (50 mg total) by mouth every 6 (six) hours as needed for pain.        Meds ordered this encounter  Medications  . lisinopril (PRINIVIL,ZESTRIL) 10 MG tablet    Sig: Take 1 tablet (10 mg total) by mouth daily.    Dispense:  90 tablet    Refill:  3  . metFORMIN (GLUCOPHAGE) 1000 MG tablet    Sig: Take 1 tablet (1,000 mg total) by mouth 2 (two) times daily with a meal.    Dispense:  180 tablet    Refill:  3  . pantoprazole (PROTONIX) 40 MG tablet    Sig: Take 1 tablet (40 mg total) by mouth daily.    Dispense:  90 tablet    Refill:  3  . pravastatin (PRAVACHOL) 40 MG tablet    Sig: Take 1 tablet (40 mg total) by mouth daily.    Dispense:  90 tablet    Refill:  3  . glimepiride (AMARYL) 4 MG tablet    Sig: Take 1 tablet (4 mg total) by mouth 2 (two) times daily.    Dispense:  180 tablet    Refill:  3     There is no immunization history on file for this patient.  Family History  Problem Relation Age of Onset  . Diabetes Sister   . Diabetes Brother   . Diabetes Daughter     History  Substance Use Topics  . Smoking status: Never Smoker   . Smokeless tobacco: Not on file  . Alcohol Use: No    Review of Systems   As noted in HPI  Filed Vitals:   09/30/14 1741  BP: 130/70  Pulse:   Temp:   Resp:     Physical Exam  Physical Exam  Constitutional: No distress.  Eyes: EOM are normal. Pupils are equal, round, and reactive to light.  Cardiovascular: Normal rate and regular rhythm.   Pulmonary/Chest: Breath sounds normal. No respiratory distress. She has no wheezes. She has no rales.  Musculoskeletal: She exhibits no edema.      CBC    Component Value Date/Time   WBC 7.4 12/15/2013 1011   RBC 4.71 12/15/2013 1011   HGB 13.9 12/15/2013 1011   HCT 40.2 12/15/2013 1011   PLT 323 12/15/2013 1011   MCV 85.4 12/15/2013 1011   LYMPHSABS 2.7 12/15/2013 1011   MONOABS 0.4 12/15/2013 1011   EOSABS 0.1 12/15/2013 1011   BASOSABS 0.0 12/15/2013 1011    CMP     Component Value Date/Time   NA 138 06/08/2014 1648   K 4.3 06/08/2014 1648   CL 104 06/08/2014 1648   CO2 26 06/08/2014 1648   GLUCOSE 126* 06/08/2014 1648   BUN 15 06/08/2014 1648   CREATININE 0.76 06/08/2014 1648   CREATININE 0.79 08/29/2010 2025   CALCIUM 9.9 06/08/2014 1648   PROT 6.9 06/08/2014 1648   ALBUMIN 4.1 06/08/2014 1648   AST 15 06/08/2014 1648   ALT 11 06/08/2014 1648   ALKPHOS 79 06/08/2014 1648   BILITOT 0.5 06/08/2014 1648   GFRNONAA 82 06/08/2014 1648   GFRNONAA >60 08/03/2010 1100   GFRAA >89 06/08/2014 1648   GFRAA  08/03/2010 1100    >60        The eGFR has been calculated using the MDRD equation. This calculation has not been validated in all clinical situations. eGFR's persistently <60 mL/min signify possible Chronic Kidney Disease.    Lab Results  Component Value Date/Time   CHOL 214* 12/15/2013 10:11 AM    No components found for: HGA1C  Lab Results  Component Value Date/Time   AST 15 06/08/2014 04:48 PM    Assessment and Plan  Other specified diabetes mellitus without complications - Plan:  Results for orders placed or performed in visit on 09/30/14  Glucose (CBG)  Result Value Ref Range   POC Glucose 296.0 (A) 70 - 99 mg/dl  HgB A1c  Result Value Ref Range   Hemoglobin A1C 9.50     HgB A1c has trended up, have advised patient for diabetes planning, she'll start taking metformin thousand milligram twice a day, have increased the dose of Amaryl to 4 mg twice a day, repeat A1c in 3 months., metFORMIN (GLUCOPHAGE) 1000 MG tablet, glimepiride (AMARYL)  4 MG tablet  Essential hypertension, benign -  Plan:advised patient for DASH diet, continue with  lisinopril (PRINIVIL,ZESTRIL) 10 MG tablet, COMPLETE METABOLIC PANEL WITH GFR  Gastroesophageal reflux disease, esophagitis presence not specified - Plan: advised patient for lifestyle modification continue with pantoprazole (PROTONIX) 40 MG tablet  Hyperlipidemia - Plan:currently patient is on  pravastatin (PRAVACHOL) 40 MG tablet, we'll check fasting lipid panel on next visit.    Return in about 3 months (around 12/30/2014) for diabetes, hypertension.   This note has been created with Surveyor, quantity. Any transcriptional errors are unintentional.    Lorayne Marek, MD

## 2014-09-30 NOTE — Progress Notes (Signed)
Patient here with daughter Would like to discuss being put back on metformin Patient is going out of the country for five months and will need Enough medication for her trip Presents in office with elevated blood pressure as well

## 2014-09-30 NOTE — Patient Instructions (Signed)
Diabetes Mellitus and Food It is important for you to manage your blood sugar (glucose) level. Your blood glucose level can be greatly affected by what you eat. Eating healthier foods in the appropriate amounts throughout the day at about the same time each day will help you control your blood glucose level. It can also help slow or prevent worsening of your diabetes mellitus. Healthy eating may even help you improve the level of your blood pressure and reach or maintain a healthy weight.  HOW CAN FOOD AFFECT ME? Carbohydrates Carbohydrates affect your blood glucose level more than any other type of food. Your dietitian will help you determine how many carbohydrates to eat at each meal and teach you how to count carbohydrates. Counting carbohydrates is important to keep your blood glucose at a healthy level, especially if you are using insulin or taking certain medicines for diabetes mellitus. Alcohol Alcohol can cause sudden decreases in blood glucose (hypoglycemia), especially if you use insulin or take certain medicines for diabetes mellitus. Hypoglycemia can be a life-threatening condition. Symptoms of hypoglycemia (sleepiness, dizziness, and disorientation) are similar to symptoms of having too much alcohol.  If your health care provider has given you approval to drink alcohol, do so in moderation and use the following guidelines:  Women should not have more than one drink per day, and men should not have more than two drinks per day. One drink is equal to:  12 oz of beer.  5 oz of wine.  1 oz of hard liquor.  Do not drink on an empty stomach.  Keep yourself hydrated. Have water, diet soda, or unsweetened iced tea.  Regular soda, juice, and other mixers might contain a lot of carbohydrates and should be counted. WHAT FOODS ARE NOT RECOMMENDED? As you make food choices, it is important to remember that all foods are not the same. Some foods have fewer nutrients per serving than other  foods, even though they might have the same number of calories or carbohydrates. It is difficult to get your body what it needs when you eat foods with fewer nutrients. Examples of foods that you should avoid that are high in calories and carbohydrates but low in nutrients include:  Trans fats (most processed foods list trans fats on the Nutrition Facts label).  Regular soda.  Juice.  Candy.  Sweets, such as cake, pie, doughnuts, and cookies.  Fried foods. WHAT FOODS CAN I EAT? Have nutrient-rich foods, which will nourish your body and keep you healthy. The food you should eat also will depend on several factors, including:  The calories you need.  The medicines you take.  Your weight.  Your blood glucose level.  Your blood pressure level.  Your cholesterol level. You also should eat a variety of foods, including:  Protein, such as meat, poultry, fish, tofu, nuts, and seeds (lean animal proteins are best).  Fruits.  Vegetables.  Dairy products, such as milk, cheese, and yogurt (low fat is best).  Breads, grains, pasta, cereal, rice, and beans.  Fats such as olive oil, trans fat-free margarine, canola oil, avocado, and olives. DOES EVERYONE WITH DIABETES MELLITUS HAVE THE SAME MEAL PLAN? Because every person with diabetes mellitus is different, there is not one meal plan that works for everyone. It is very important that you meet with a dietitian who will help you create a meal plan that is just right for you. Document Released: 03/14/2005 Document Revised: 06/22/2013 Document Reviewed: 05/14/2013 ExitCare Patient Information 2015 [employer]. This   information is not intended to replace advice given to you by your health care provider. Make sure you discuss any questions you have with your health care provider. DASH Eating Plan DASH stands for "Dietary Approaches to Stop Hypertension." The DASH eating plan is a healthy eating plan that has been shown to reduce high  blood pressure (hypertension). Additional health benefits may include reducing the risk of type 2 diabetes mellitus, heart disease, and stroke. The DASH eating plan may also help with weight loss. WHAT DO I NEED TO KNOW ABOUT THE DASH EATING PLAN? For the DASH eating plan, you will follow these general guidelines:  Choose foods with a percent daily value for sodium of less than 5% (as listed on the food label).  Use salt-free seasonings or herbs instead of table salt or sea salt.  Check with your health care provider or pharmacist before using salt substitutes.  Eat lower-sodium products, often labeled as "lower sodium" or "no salt added."  Eat fresh foods.  Eat more vegetables, fruits, and low-fat dairy products.  Choose whole grains. Look for the word "whole" as the first word in the ingredient list.  Choose fish and skinless chicken or turkey more often than red meat. Limit fish, poultry, and meat to 6 oz (170 g) each day.  Limit sweets, desserts, sugars, and sugary drinks.  Choose heart-healthy fats.  Limit cheese to 1 oz (28 g) per day.  Eat more home-cooked food and less restaurant, buffet, and fast food.  Limit fried foods.  Cook foods using methods other than frying.  Limit canned vegetables. If you do use them, rinse them well to decrease the sodium.  When eating at a restaurant, ask that your food be prepared with less salt, or no salt if possible. WHAT FOODS CAN I EAT? Seek help from a dietitian for individual calorie needs. Grains Whole grain or whole wheat bread. Brown rice. Whole grain or whole wheat pasta. Quinoa, bulgur, and whole grain cereals. Low-sodium cereals. Corn or whole wheat flour tortillas. Whole grain cornbread. Whole grain crackers. Low-sodium crackers. Vegetables Fresh or frozen vegetables (raw, steamed, roasted, or grilled). Low-sodium or reduced-sodium tomato and vegetable juices. Low-sodium or reduced-sodium tomato sauce and paste. Low-sodium  or reduced-sodium canned vegetables.  Fruits All fresh, canned (in natural juice), or frozen fruits. Meat and Other Protein Products Ground beef (85% or leaner), grass-fed beef, or beef trimmed of fat. Skinless chicken or turkey. Ground chicken or turkey. Pork trimmed of fat. All fish and seafood. Eggs. Dried beans, peas, or lentils. Unsalted nuts and seeds. Unsalted canned beans. Dairy Low-fat dairy products, such as skim or 1% milk, 2% or reduced-fat cheeses, low-fat ricotta or cottage cheese, or plain low-fat yogurt. Low-sodium or reduced-sodium cheeses. Fats and Oils Tub margarines without trans fats. Light or reduced-fat mayonnaise and salad dressings (reduced sodium). Avocado. Safflower, olive, or canola oils. Natural peanut or almond butter. Other Unsalted popcorn and pretzels. The items listed above may not be a complete list of recommended foods or beverages. Contact your dietitian for more options. WHAT FOODS ARE NOT RECOMMENDED? Grains White bread. White pasta. White rice. Refined cornbread. Bagels and croissants. Crackers that contain trans fat. Vegetables Creamed or fried vegetables. Vegetables in a cheese sauce. Regular canned vegetables. Regular canned tomato sauce and paste. Regular tomato and vegetable juices. Fruits Dried fruits. Canned fruit in light or heavy syrup. Fruit juice. Meat and Other Protein Products Fatty cuts of meat. Ribs, chicken wings, bacon, sausage, bologna, salami, chitterlings, fatback, hot   dogs, bratwurst, and packaged luncheon meats. Salted nuts and seeds. Canned beans with salt. Dairy Whole or 2% milk, cream, half-and-half, and cream cheese. Whole-fat or sweetened yogurt. Full-fat cheeses or blue cheese. Nondairy creamers and whipped toppings. Processed cheese, cheese spreads, or cheese curds. Condiments Onion and garlic salt, seasoned salt, table salt, and sea salt. Canned and packaged gravies. Worcestershire sauce. Tartar sauce. Barbecue sauce.  Teriyaki sauce. Soy sauce, including reduced sodium. Steak sauce. Fish sauce. Oyster sauce. Cocktail sauce. Horseradish. Ketchup and mustard. Meat flavorings and tenderizers. Bouillon cubes. Hot sauce. Tabasco sauce. Marinades. Taco seasonings. Relishes. Fats and Oils Butter, stick margarine, lard, shortening, ghee, and bacon fat. Coconut, palm kernel, or palm oils. Regular salad dressings. Other Pickles and olives. Salted popcorn and pretzels. The items listed above may not be a complete list of foods and beverages to avoid. Contact your dietitian for more information. WHERE CAN I FIND MORE INFORMATION? National Heart, Lung, and [employer] Institute: www.nhlbi.nih.gov/health/health-topics/topics/dash/ Document Released: 06/06/2011 Document Revised: 11/01/2013 Document Reviewed: 04/21/2013 ExitCare Patient Information 2015 [employer]. This information is not intended to replace advice given to you by your health care provider. Make sure you discuss any questions you have with your health care provider.  

## 2014-10-18 ENCOUNTER — Other Ambulatory Visit: Payer: Self-pay | Admitting: Internal Medicine

## 2015-05-02 ENCOUNTER — Encounter: Payer: Self-pay | Admitting: Family Medicine

## 2015-05-02 ENCOUNTER — Other Ambulatory Visit: Payer: Self-pay | Admitting: Pharmacist

## 2015-05-02 ENCOUNTER — Ambulatory Visit: Payer: Medicare Other | Attending: Family Medicine | Admitting: Family Medicine

## 2015-05-02 VITALS — BP 167/73 | HR 76 | Temp 98.5°F | Resp 16 | Ht 63.0 in | Wt 145.0 lb

## 2015-05-02 DIAGNOSIS — Z23 Encounter for immunization: Secondary | ICD-10-CM | POA: Insufficient documentation

## 2015-05-02 DIAGNOSIS — I10 Essential (primary) hypertension: Secondary | ICD-10-CM | POA: Diagnosis not present

## 2015-05-02 DIAGNOSIS — B353 Tinea pedis: Secondary | ICD-10-CM | POA: Diagnosis not present

## 2015-05-02 DIAGNOSIS — B3731 Acute candidiasis of vulva and vagina: Secondary | ICD-10-CM

## 2015-05-02 DIAGNOSIS — E785 Hyperlipidemia, unspecified: Secondary | ICD-10-CM | POA: Diagnosis not present

## 2015-05-02 DIAGNOSIS — E119 Type 2 diabetes mellitus without complications: Secondary | ICD-10-CM | POA: Diagnosis present

## 2015-05-02 DIAGNOSIS — B373 Candidiasis of vulva and vagina: Secondary | ICD-10-CM | POA: Diagnosis not present

## 2015-05-02 DIAGNOSIS — E1165 Type 2 diabetes mellitus with hyperglycemia: Secondary | ICD-10-CM | POA: Insufficient documentation

## 2015-05-02 DIAGNOSIS — Z7984 Long term (current) use of oral hypoglycemic drugs: Secondary | ICD-10-CM | POA: Insufficient documentation

## 2015-05-02 DIAGNOSIS — H409 Unspecified glaucoma: Secondary | ICD-10-CM | POA: Diagnosis not present

## 2015-05-02 DIAGNOSIS — Z0000 Encounter for general adult medical examination without abnormal findings: Secondary | ICD-10-CM | POA: Diagnosis not present

## 2015-05-02 DIAGNOSIS — K219 Gastro-esophageal reflux disease without esophagitis: Secondary | ICD-10-CM | POA: Diagnosis not present

## 2015-05-02 DIAGNOSIS — E118 Type 2 diabetes mellitus with unspecified complications: Secondary | ICD-10-CM | POA: Diagnosis not present

## 2015-05-02 DIAGNOSIS — IMO0002 Reserved for concepts with insufficient information to code with codable children: Secondary | ICD-10-CM

## 2015-05-02 DIAGNOSIS — Z79899 Other long term (current) drug therapy: Secondary | ICD-10-CM | POA: Insufficient documentation

## 2015-05-02 LAB — POCT URINALYSIS DIPSTICK
BILIRUBIN UA: NEGATIVE
GLUCOSE UA: 500
Ketones, UA: NEGATIVE
LEUKOCYTES UA: NEGATIVE
NITRITE UA: NEGATIVE
PH UA: 5.5
Protein, UA: NEGATIVE
Spec Grav, UA: 1.015
Urobilinogen, UA: 0.2

## 2015-05-02 LAB — POCT GLYCOSYLATED HEMOGLOBIN (HGB A1C): HEMOGLOBIN A1C: 11.2

## 2015-05-02 LAB — GLUCOSE, POCT (MANUAL RESULT ENTRY): POC GLUCOSE: 341 mg/dL — AB (ref 70–99)

## 2015-05-02 MED ORDER — PANTOPRAZOLE SODIUM 20 MG PO TBEC: 20.0000 mg | DELAYED_RELEASE_TABLET | Freq: Every day | ORAL | Status: DC

## 2015-05-02 MED ORDER — PRAVASTATIN SODIUM 40 MG PO TABS: 40.0000 mg | ORAL_TABLET | Freq: Every day | ORAL | Status: DC | PRN

## 2015-05-02 MED ORDER — GLUCOSE BLOOD VI STRP: 1.0000 | ORAL_STRIP | Freq: Three times a day (TID) | Status: DC

## 2015-05-02 MED ORDER — GLUCOSE BLOOD VI STRP: ORAL_STRIP | Status: DC

## 2015-05-02 MED ORDER — PANTOPRAZOLE SODIUM 40 MG PO TBEC: 40.0000 mg | DELAYED_RELEASE_TABLET | Freq: Every day | ORAL | Status: DC

## 2015-05-02 MED ORDER — SITAGLIPTIN PHOSPHATE 100 MG PO TABS: 100.0000 mg | ORAL_TABLET | Freq: Every day | ORAL | Status: DC

## 2015-05-02 MED ORDER — PRAVASTATIN SODIUM 20 MG PO TABS: 40.0000 mg | ORAL_TABLET | Freq: Every day | ORAL | Status: DC | PRN

## 2015-05-02 MED ORDER — GLIMEPIRIDE 4 MG PO TABS: 4.0000 mg | ORAL_TABLET | Freq: Two times a day (BID) | ORAL | Status: DC

## 2015-05-02 MED ORDER — FLUCONAZOLE 150 MG PO TABS: 150.0000 mg | ORAL_TABLET | ORAL | Status: DC

## 2015-05-02 MED ORDER — ACCU-CHEK AVIVA PLUS W/DEVICE KIT: PACK | Status: DC

## 2015-05-02 MED ORDER — ONETOUCH ULTRA SYSTEM W/DEVICE KIT
1.0000 | PACK | Freq: Once | Status: DC
Start: 2015-05-02 — End: 2015-05-02

## 2015-05-02 MED ORDER — KETOCONAZOLE 2 % EX CREA: 1.0000 "application " | TOPICAL_CREAM | Freq: Every day | CUTANEOUS | Status: DC

## 2015-05-02 MED ORDER — LISINOPRIL 40 MG PO TABS: 40.0000 mg | ORAL_TABLET | Freq: Every day | ORAL | Status: DC

## 2015-05-02 MED ORDER — ONETOUCH ULTRA SYSTEM W/DEVICE KIT: 1.0000 | PACK | Freq: Once | Status: DC

## 2015-05-02 MED ORDER — METFORMIN HCL 1000 MG PO TABS: 1000.0000 mg | ORAL_TABLET | Freq: Two times a day (BID) | ORAL | Status: DC

## 2015-05-02 NOTE — Patient Instructions (Signed)
Tina Phelps was seen today for diabetes and hypertension.  Diagnoses and all orders for this visit:  Type 2 diabetes mellitus without complication, unspecified long term insulin use status (HCC) -     POCT glycosylated hemoglobin (Hb A1C) -     POCT glucose (manual entry) -     Microalbumin/Creatinine Ratio, Urine -     Flu Vaccine QUAD 36+ mos IM -     POCT urinalysis dipstick -     sitaGLIPtin (JANUVIA) 100 MG tablet; Take 1 tablet (100 mg total) by mouth daily. -     glucose blood (ACCU-CHEK AVIVA PLUS) test strip; 1 each by Other route 3 (three) times daily.  Other specified diabetes mellitus without complications ([place]) -     metFORMIN (GLUCOPHAGE) 1000 MG tablet; Take 1 tablet (1,000 mg total) by mouth 2 (two) times daily with a meal. -     glimepiride (AMARYL) 4 MG tablet; Take 1 tablet (4 mg total) by mouth 2 (two) times daily.  Essential hypertension, benign -     lisinopril (PRINIVIL,ZESTRIL) 40 MG tablet; Take 1 tablet (40 mg total) by mouth daily.  Gastroesophageal reflux disease, esophagitis presence not specified -     Discontinue: pantoprazole (PROTONIX) 40 MG tablet; Take 1 tablet (40 mg total) by mouth daily. -     pantoprazole (PROTONIX) 20 MG tablet; Take 1 tablet (20 mg total) by mouth daily.  Hyperlipidemia -     Discontinue: pravastatin (PRAVACHOL) 20 MG tablet; Take 2 tablets (40 mg total) by mouth daily as needed. -     pravastatin (PRAVACHOL) 40 MG tablet; Take 1 tablet (40 mg total) by mouth daily as needed.  Tinea pedis of left foot -     fluconazole (DIFLUCAN) 150 MG tablet; Take 1 tablet (150 mg total) by mouth every 3 (three) days. -     ketoconazole (NIZORAL) 2 % cream; Apply 1 application topically daily.  Vaginal yeast infection -     fluconazole (DIFLUCAN) 150 MG tablet; Take 1 tablet (150 mg total) by mouth every 3 (three) days. -     ketoconazole (NIZORAL) 2 % cream; Apply 1 application topically daily.    Diabetes blood sugar goals  Fasting (in AM  before breakfast, 8 hrs of no eating or drinking (except water or unsweetened coffee or tea): 90-110 2 hrs after meals: < 160,   No low sugars: nothing < 70   F/u in 3 weeks with pharmacist for cbg and bp check  F/u with me in 3 months for diabetes   Dr. Adrian Blackwater

## 2015-05-02 NOTE — Progress Notes (Signed)
F/U DM HTN C/C burning and itching on feet  Elevated Glucose today Stated taking medication as prescribed  Refused insulin.   Used Engineer, manufacturing [phone]

## 2015-05-02 NOTE — Progress Notes (Signed)
Patient ID: [name], female   DOB: 1945/09/15, 69 y.o.   MRN: [id number]   Subjective:  Patient ID: [name], female    DOB: 1946-05-19  Age: 69 y.o. MRN: [id number]  CC: Diabetes and Hypertension   HPI [name] Bartus presents for   1. CHRONIC DIABETES  Disease Monitoring  Blood Sugar Ranges: 150-200 fasting   Polyuria: no   Visual problems: no   Medication Compliance: yes  Medication Side Effects  Hypoglycemia: no   Preventitive Health Care  Eye Exam: last saw eye doctor 3 months ago   Foot Exam: done today   Diet pattern: small meals, high carb  Exercise: none   2. CHRONIC HYPERTENSION  Disease Monitoring  Blood pressure range: not checking, lisinopril 10 mg daily   Chest pain: no   Dyspnea: no   Claudication: no   Medication compliance: yes  Medication Side Effects  Lightheadedness: no   Urinary frequency: no   Edema: no     Social History  Substance Use Topics  . Smoking status: Never Smoker   . Smokeless tobacco: Not on file  . Alcohol Use: No    Outpatient Prescriptions Prior to Visit  Medication Sig Dispense Refill  . lisinopril (PRINIVIL,ZESTRIL) 10 MG tablet Take 1 tablet (10 mg total) by mouth daily. 90 tablet 3  . metFORMIN (GLUCOPHAGE) 1000 MG tablet Take 1 tablet (1,000 mg total) by mouth 2 (two) times daily with a meal. 180 tablet 3  . pantoprazole (PROTONIX) 40 MG tablet Take 1 tablet (40 mg total) by mouth daily. 90 tablet 3  . pravastatin (PRAVACHOL) 40 MG tablet Take 1 tablet (40 mg total) by mouth daily. 90 tablet 3  . timolol (BETIMOL) 0.5 % ophthalmic solution Place 1 drop into both eyes at bedtime. 10 mL 3  . clindamycin (CLEOCIN) 300 MG capsule Take 1 capsule (300 mg total) by mouth 3 (three) times daily. (Patient not taking: Reported on 05/02/2015) 30 capsule 0  . dorzolamide-timolol (COSOPT) 22.3-6.8 MG/ML ophthalmic solution Place 1 drop into both eyes 2 (two) times daily. (Patient not taking: Reported on 05/02/2015) 10 mL 3  .  glimepiride (AMARYL) 4 MG tablet Take 1 tablet (4 mg total) by mouth 2 (two) times daily. (Patient not taking: Reported on 05/02/2015) 180 tablet 3  . sitaGLIPtin-metformin (JANUMET) 50-1000 MG per tablet Take 1 tablet by mouth 2 (two) times daily with a meal. (Patient not taking: Reported on 05/02/2015) 180 tablet 3  . traMADol (ULTRAM) 50 MG tablet Take 1 tablet (50 mg total) by mouth every 6 (six) hours as needed for pain. (Patient not taking: Reported on 05/02/2015) 60 tablet 3   No facility-administered medications prior to visit.    ROS Review of Systems  Constitutional: Negative for fever and chills.  Eyes: Negative for visual disturbance.  Respiratory: Negative for shortness of breath.   Cardiovascular: Negative for chest pain.  Gastrointestinal: Negative for abdominal pain and blood in stool.  Musculoskeletal: Negative for back pain and arthralgias.  Skin: Positive for pallor (on L foot between 4th and 5th toes ). Negative for rash.  Allergic/Immunologic: Negative for immunocompromised state.  Neurological: Positive for numbness (in fingers and toes ).  Hematological: Negative for adenopathy. Does not bruise/bleed easily.  Psychiatric/Behavioral: Negative for suicidal ideas and dysphoric mood.    Objective:  BP 167/73 mmHg  Pulse 76  Temp(Src) 98.5 F (36.9 C) (Oral)  Resp 16  Ht $R'5\' 3"'oW$  (1.6 m)  Wt 145 lb (65.772 kg)  BMI  25.69 kg/m2  SpO2 98%  BP/Weight 05/02/2015 09/30/2014 76/07/9507  Systolic BP 326 712 458  Diastolic BP 73 70 72  Wt. (Lbs) 145 144.6 138.8  BMI 25.69 26.44 25.38     Physical Exam  Constitutional: She is oriented to person, place, and time. She appears well-developed and well-nourished. No distress.  HENT:  Head: Normocephalic and atraumatic.  Cardiovascular: Normal rate, regular rhythm, normal heart sounds and intact distal pulses.   Pulmonary/Chest: Effort normal and breath sounds normal.  Musculoskeletal: She exhibits no edema.  Neurological:  She is alert and oriented to person, place, and time.  Skin: Skin is warm and dry. Rash (maceration between 4th and 5th toes on L foot ) noted.  Psychiatric: She has a normal mood and affect.    CBG 341  Patient refused insulin  Lab Results  Component Value Date   HGBA1C 11.20 05/02/2015     Assessment & Plan:   Problem List Items Addressed This Visit    Diabetes mellitus type 2, uncontrolled, with complications ([place]) - Primary (Chronic)   Relevant Medications   sitaGLIPtin (JANUVIA) 100 MG tablet   metFORMIN (GLUCOPHAGE) 1000 MG tablet   glimepiride (AMARYL) 4 MG tablet   lisinopril (PRINIVIL,ZESTRIL) 40 MG tablet   pravastatin (PRAVACHOL) 40 MG tablet   Blood Glucose Monitoring Suppl (ONE TOUCH ULTRA SYSTEM KIT) W/DEVICE KIT   glucose blood test strip   Other Relevant Orders   POCT glycosylated hemoglobin (Hb A1C) (Completed)   POCT glucose (manual entry) (Completed)   Microalbumin/Creatinine Ratio, Urine   POCT urinalysis dipstick (Completed)   Essential hypertension, benign (Chronic)   Relevant Medications   lisinopril (PRINIVIL,ZESTRIL) 40 MG tablet   pravastatin (PRAVACHOL) 40 MG tablet   Glaucoma (Chronic)   Relevant Medications   brimonidine (ALPHAGAN) 0.2 % ophthalmic solution   latanoprost (XALATAN) 0.005 % ophthalmic solution   Tinea pedis of left foot   Relevant Medications   fluconazole (DIFLUCAN) 150 MG tablet   ketoconazole (NIZORAL) 2 % cream    Other Visit Diagnoses    Gastroesophageal reflux disease, esophagitis presence not specified        Relevant Medications    pantoprazole (PROTONIX) 20 MG tablet    Hyperlipidemia        Relevant Medications    lisinopril (PRINIVIL,ZESTRIL) 40 MG tablet    pravastatin (PRAVACHOL) 40 MG tablet    Vaginal yeast infection        Relevant Medications    fluconazole (DIFLUCAN) 150 MG tablet    ketoconazole (NIZORAL) 2 % cream    Healthcare maintenance        Relevant Orders    Flu Vaccine QUAD 36+ mos IM  (Completed)       No orders of the defined types were placed in this encounter.    Follow-up: No Follow-up on file.   Boykin Nearing MD

## 2015-05-02 NOTE — Addendum Note (Signed)
Addended by: Boykin Nearing on: 05/02/2015 04:28 PM   Modules accepted: Miquel Dunn

## 2015-05-03 LAB — MICROALBUMIN / CREATININE URINE RATIO
Creatinine, Urine: 48 mg/dL (ref 20–320)
MICROALB/CREAT RATIO: 21 ug/mg{creat} (ref <30)
Microalb, Ur: 1 mg/dL

## 2015-05-11 ENCOUNTER — Telehealth: Payer: Self-pay | Admitting: *Deleted

## 2015-05-11 NOTE — Telephone Encounter (Signed)
Used pacific interpreter Arabic [phone] Date of birth verified by pt Urine microalbumin normal results given to pt Pt verbalized understanding

## 2015-05-11 NOTE — Telephone Encounter (Signed)
-----   Message from Boykin Nearing, MD sent at 05/03/2015  8:29 AM EDT ----- Normal urine microalbumin

## 2015-05-16 ENCOUNTER — Other Ambulatory Visit: Payer: Self-pay | Admitting: *Deleted

## 2015-05-16 MED ORDER — GLUCOSE BLOOD VI STRP: ORAL_STRIP | Status: DC

## 2015-05-23 ENCOUNTER — Encounter: Payer: Self-pay | Admitting: Pharmacist

## 2015-05-23 ENCOUNTER — Telehealth: Payer: Self-pay | Admitting: Internal Medicine

## 2015-05-23 ENCOUNTER — Ambulatory Visit: Payer: Medicaid Other | Attending: Family Medicine | Admitting: Pharmacist

## 2015-05-23 VITALS — BP 136/72 | HR 73

## 2015-05-23 DIAGNOSIS — E119 Type 2 diabetes mellitus without complications: Secondary | ICD-10-CM | POA: Diagnosis present

## 2015-05-23 DIAGNOSIS — Z7984 Long term (current) use of oral hypoglycemic drugs: Secondary | ICD-10-CM | POA: Diagnosis not present

## 2015-05-23 DIAGNOSIS — E118 Type 2 diabetes mellitus with unspecified complications: Secondary | ICD-10-CM

## 2015-05-23 DIAGNOSIS — E1165 Type 2 diabetes mellitus with hyperglycemia: Secondary | ICD-10-CM

## 2015-05-23 DIAGNOSIS — IMO0002 Reserved for concepts with insufficient information to code with codable children: Secondary | ICD-10-CM

## 2015-05-23 NOTE — Telephone Encounter (Signed)
LVM to return call.

## 2015-05-23 NOTE — Progress Notes (Signed)
S:    Patient arrives in good spirits with her daughter, who translates for her.  Presents for diabetes follow up  Patient reports adherence with medications. Current diabetes medications include glimepiride 4 mg BID, metformin 1000 mg BID, and Januvia 100 mg daily.  Patient denies hypoglycemic events.  Patient reported dietary habits: she eats mostly fruits, vegetables, meats, and some pita bread. She also eats a lot of yogurt. She drinks water throughout the day but will have tea with a teaspoon of sugar in it.   Patient reported exercise habits: none   Patient reports nocturia 2-3 times per night.  Patient denies neuropathy. Patient denies visual changes. Patient reports self foot exams.    O:  Lab Results  Component Value Date   HGBA1C 11.20 05/02/2015   Patient does not have log book or meter with her at visit  Home fasting CBG: 200s  2 hour post-prandial/random CBG: does not monitor.  A/P: Diabetes currently uncontrolled based on A1c of 11.2 and reported home CBGs.   Patient denies hypoglycemic events and is able to verbalize appropriate hypoglycemia management plan.  Patient reports adherence with medication. Control is suboptimal due to dietary indiscretion and sedentary lifestyle..  Continue glimepiride 4 mg BID, metformin 1000 mg BID, and Januvia 100 mg daily. Need to see actual readings from meter to make any further medication adjustments. Asked patient to get daily fasting readings as well as a few post-prandial readings and to return to clinic in 3 weeks with the numbers. Provided a log book and educated patient on the plate method. Patient will plan on also keeping a food diary with portions. Will adjust medications at next visit if needed.   Next A1C anticipated February 2017.    Written patient instructions provided.  Total time in face to face counseling 20 minutes.    Follow up in Pharmacist Clinic Visit in 3 weeks.   Patient seen with Carolin Coy, PharmD  Candidate

## 2015-05-23 NOTE — Telephone Encounter (Signed)
Patients daughter called stating that Celesta Gentile is no longer covered by Medicare and would like it changed. Please f/u

## 2015-05-23 NOTE — Patient Instructions (Signed)
Thanks for coming to see me!  Check you blood sugars at least once in the morning before you eat and a few times 2 hours after you eat.  Come back and see me in three weeks.

## 2015-05-30 NOTE — Telephone Encounter (Signed)
Patient is calling to check on the request for Januvia or a similar medication since Medicare does not cover it. Patient is here and is hoping to get this addressed before she picks up her medication.

## 2015-06-07 MED ORDER — GLIPIZIDE 5 MG PO TABS: 2.5000 mg | ORAL_TABLET | Freq: Every day | ORAL | Status: DC

## 2015-06-07 NOTE — Assessment & Plan Note (Signed)
Since [place] is not available, D/C januvia, replace with short-acting sulfonylurea such as glipizide. At typical starting dose of 2.5 mg of glipizide taken 30 minutes before breakfast  Advise patient to increase to 5 mg daily after one week if no low sugars (CBG < 70) occur Then 5 mg BID with largest meals in one more week if no low sugars occur

## 2015-06-07 NOTE — Telephone Encounter (Signed)
  Since [place] is not available, D/C januvia, replace with short-acting sulfonylurea such as glipizide. At typical starting dose of 2.5 mg of glipizide taken 30 minutes before breakfast  Advise patient to increase to 5 mg daily after one week if no low sugars (CBG < 70) occur Then 5 mg BID with largest meals in one more week if no low sugars occur

## 2015-06-12 NOTE — Telephone Encounter (Signed)
Used pacific interpreter Arabic [phone] Pt advised to take change Januvia to Glipized  Starting dose of 2.5mg  taken 30 min before breakfast  Advise patient to increase to 5 mg daily after one week if no low sugars (CBG < 70) occur Then 5 mg BID with largest meals in one more week if no low sugars occur  Pt verbalized understanding

## 2015-06-13 ENCOUNTER — Ambulatory Visit: Payer: Medicare Other | Attending: Family Medicine | Admitting: Pharmacist

## 2015-06-13 DIAGNOSIS — Z79899 Other long term (current) drug therapy: Secondary | ICD-10-CM | POA: Insufficient documentation

## 2015-06-13 DIAGNOSIS — E119 Type 2 diabetes mellitus without complications: Secondary | ICD-10-CM | POA: Insufficient documentation

## 2015-06-13 DIAGNOSIS — E1165 Type 2 diabetes mellitus with hyperglycemia: Secondary | ICD-10-CM

## 2015-06-13 DIAGNOSIS — E118 Type 2 diabetes mellitus with unspecified complications: Secondary | ICD-10-CM

## 2015-06-13 DIAGNOSIS — IMO0002 Reserved for concepts with insufficient information to code with codable children: Secondary | ICD-10-CM

## 2015-06-13 NOTE — Patient Instructions (Signed)
Do not take the glipizide. Return it to the pharmacy if you can. This is the same medication as glimepiride and you should not take both.  Take the metformin and glimepiride as you have been.  Lets keep working on eating healthy and exercising.  Make an appointment with our financial counselor to help you with the insurance.  Come back and see me or Dr. Adrian Blackwater before you leave to make sure you have everything you need!

## 2015-06-13 NOTE — Progress Notes (Signed)
S:    Patient arrives in good spirits with her daughter, who translates for her.  Presents for diabetes follow up  Patient reports adherence with medications. Current diabetes medications include glimepiride 4 mg BID and metformin 1000 mg BID. They just picked up glipizide from the pharmacy  Patient denies hypoglycemic events.  Patient reported dietary habits: patient is trying to eat less carbs  Patient reported exercise habits: she has been up moving around more and completing household chores.    Patient reports nocturia.  Patient denies neuropathy. Patient denies visual changes. Patient reports self foot exams.   Patient will be leaving to go to [place] for [place] months in two weeks.   Patient's Medicaid and Medicare were cancelled by mistake per daughter's report.    O:  Lab Results  Component Value Date   HGBA1C 11.20 05/02/2015    Home fasting CBG: 200s  7 day average = 248 14 day average = 286 30 day average = 311   A/P: Diabetes currently uncontrolled based on A1c of 11.2 and reported home CBGs. However, it appears that control is slowly improving based on averages obtained from meter.   Patient denies hypoglycemic events and is able to verbalize appropriate hypoglycemia management plan.  Patient reports adherence with medication. Control is suboptimal due to dietary indiscretion and sedentary lifestyle.  Continue glimepiride 4 mg BID, metformin 1000 mg BID. Discontinued glipizide and instructed patient to not take it as it is a duplicate in therapy. Medicare and/or Medicaid is supposed to be reinstated soon, however, patient currently has few options due to lack of insurance. Would like to consider insulin for this patient but she refused and it would also be concerning to initiate it and then have her leave the country for a few months and not being able to follow up. For now, will focus on lifestyle modifications, and encouraged patient to exercise. Encouraged patient and  daughter to follow up with financial counselor and to try to obtain insurance or [facility] discount for access to medications. Patient can return to see me if she obtains insurance or drug coverage prior to leaving the country. Patient has enough of her medications to last her the entire trip.   Next A1C anticipated February 2017.    Written patient instructions provided.  Total time in face to face counseling 20 minutes.    Follow up in Pharmacist Clinic Visit as needed - patient leaving the country within next two weeks.

## 2015-06-19 ENCOUNTER — Ambulatory Visit: Payer: Medicare Other | Attending: Family Medicine

## 2015-11-15 MED FILL — BRIMONIDINE 0.2% EYE DROP: 0.2 | 90 days supply | Qty: 15 | Fill #1

## 2015-11-15 MED FILL — LISINOPRIL 40 MG TABLET: 40 | 60 days supply | Qty: 60 | Fill #3

## 2015-11-15 MED FILL — PRAVASTATIN NA 40 MG TAB: 40 | 60 days supply | Qty: 60 | Fill #3

## 2015-11-15 MED FILL — GLIMEPIRIDE 4 MG TABLET: 4 | 60 days supply | Qty: 120 | Fill #3

## 2015-11-17 ENCOUNTER — Other Ambulatory Visit: Payer: Self-pay | Admitting: Internal Medicine

## 2015-11-21 MED FILL — PANTOPRAZOLE SOD DR 20 MG T: 20 | 60 days supply | Qty: 60 | Fill #3

## 2015-11-21 MED FILL — metFORMIN HCL 1000 MG TABS: 1000 | 60 days supply | Qty: 120 | Fill #3

## 2016-01-26 ENCOUNTER — Ambulatory Visit: Payer: Self-pay | Attending: Family Medicine | Admitting: Family Medicine

## 2016-01-26 ENCOUNTER — Encounter: Payer: Self-pay | Admitting: Family Medicine

## 2016-01-26 VITALS — BP 138/78 | HR 93 | Temp 98.4°F | Resp 16 | Ht 63.0 in | Wt 142.6 lb

## 2016-01-26 DIAGNOSIS — IMO0002 Reserved for concepts with insufficient information to code with codable children: Secondary | ICD-10-CM

## 2016-01-26 DIAGNOSIS — I10 Essential (primary) hypertension: Secondary | ICD-10-CM

## 2016-01-26 DIAGNOSIS — E1165 Type 2 diabetes mellitus with hyperglycemia: Secondary | ICD-10-CM

## 2016-01-26 DIAGNOSIS — M1711 Unilateral primary osteoarthritis, right knee: Secondary | ICD-10-CM

## 2016-01-26 DIAGNOSIS — E118 Type 2 diabetes mellitus with unspecified complications: Secondary | ICD-10-CM

## 2016-01-26 LAB — GLUCOSE, POCT (MANUAL RESULT ENTRY): POC Glucose: 284 mg/dl — AB (ref 70–99)

## 2016-01-26 LAB — POCT GLYCOSYLATED HEMOGLOBIN (HGB A1C): HEMOGLOBIN A1C: 12.4

## 2016-01-26 MED ORDER — LISINOPRIL 20 MG PO TABS: 20.0000 mg | ORAL_TABLET | Freq: Every day | ORAL | 3 refills | Status: DC

## 2016-01-26 MED ORDER — DICLOFENAC SODIUM 1 % TD GEL: 2.0000 g | Freq: Four times a day (QID) | TRANSDERMAL | 3 refills | Status: DC

## 2016-01-26 MED FILL — VOLTAREN 1% GEL: 1 | 20 days supply | Qty: 100 | Fill #0

## 2016-01-26 MED FILL — ?LISINOPRIL 20 MG TABLET: 20 | 30 days supply | Qty: 30 | Fill #0

## 2016-01-26 NOTE — Progress Notes (Signed)
Patient is here for swelling in the right knee

## 2016-01-26 NOTE — Progress Notes (Signed)
Subjective:  Patient ID: [name], female    DOB: 06/05/1946  Age: 70 y.o. MRN: [id number]  CC: Knee Pain   HPI Tina Phelps is a 70 yo F from [place] she has diabetes and HTN, she  presents with her daughter for   1. Knee pain: pain ans swelling in her R knee has improved from onset. There was no redness or injury. She treated with pain with topical NSAID. She hear clicking in her knee at times.   2. Diabetes: she is complaint with metformin and glipizide. She refuses to take insulin or to change her oral therapy. She reports that she has not been compliant with her exercise. She reports that she usually walks more often and walking helps her reduce her A1c to 9. She ate < 1 hr ago.   3. HTN: taking lisinopril 20 mg daily. No HA, CP, leg swelling or cough.    Social History  Substance Use Topics  . Smoking status: Never Smoker  . Smokeless tobacco: Not on file  . Alcohol use No   Outpatient Medications Prior to Visit  Medication Sig Dispense Refill  . Blood Glucose Monitoring Suppl (ONE TOUCH ULTRA SYSTEM KIT) W/DEVICE KIT 1 kit by Does not apply route once. 1 each 0  . brimonidine (ALPHAGAN) 0.2 % ophthalmic solution Place 1 drop into both eyes 3 (three) times daily.    . dorzolamide-timolol (COSOPT) 22.3-6.8 MG/ML ophthalmic solution Place 1 drop into both eyes 2 (two) times daily. (Patient not taking: Reported on 05/02/2015) 10 mL 3  . fluconazole (DIFLUCAN) 150 MG tablet Take 1 tablet (150 mg total) by mouth every 3 (three) days. 2 tablet 0  . glimepiride (AMARYL) 4 MG tablet Take 1 tablet (4 mg total) by mouth 2 (two) times daily. 60 tablet 11  . glucose blood test strip Use as instructed 100 each 12  . ketoconazole (NIZORAL) 2 % cream Apply 1 application topically daily. 15 g 0  . latanoprost (XALATAN) 0.005 % ophthalmic solution 1 drop at bedtime.    Marland Kitchen lisinopril (PRINIVIL,ZESTRIL) 40 MG tablet Take 1 tablet (40 mg total) by mouth daily. 30 tablet 5  . metFORMIN (GLUCOPHAGE)  1000 MG tablet Take 1 tablet (1,000 mg total) by mouth 2 (two) times daily with a meal. 60 tablet 11  . pantoprazole (PROTONIX) 20 MG tablet Take 1 tablet (20 mg total) by mouth daily. 30 tablet 5  . pravastatin (PRAVACHOL) 40 MG tablet Take 1 tablet (40 mg total) by mouth daily as needed. 30 tablet 5  . timolol (BETIMOL) 0.5 % ophthalmic solution Place 1 drop into both eyes at bedtime. 10 mL 3   No facility-administered medications prior to visit.     ROS Review of Systems  Constitutional: Negative for chills and fever.  Eyes: Negative for visual disturbance.  Respiratory: Negative for shortness of breath.   Cardiovascular: Negative for chest pain.  Gastrointestinal: Negative for abdominal pain and blood in stool.  Musculoskeletal: Positive for arthralgias. Negative for back pain.  Skin: Negative for rash.  Allergic/Immunologic: Negative for immunocompromised state.  Hematological: Negative for adenopathy. Does not bruise/bleed easily.  Psychiatric/Behavioral: Negative for dysphoric mood and suicidal ideas.    Objective:  BP 138/78 (BP Location: Left Arm, Patient Position: Sitting, Cuff Size: Large)   Pulse 93   Temp 98.4 F (36.9 C) (Oral)   Resp 16   Ht '5\' 3"'$  (1.6 m)   Wt 142 lb 9.6 oz (64.7 kg)   SpO2 96%  BMI 25.26 kg/m   BP/Weight 01/26/2016 05/23/2015 57/01/4695  Systolic BP 295 284 132  Diastolic BP 78 72 73  Wt. (Lbs) 142.6 - 145  BMI 25.26 - 25.69    Physical Exam  Constitutional: She is oriented to person, place, and time. She appears well-developed and well-nourished. No distress.  HENT:  Head: Normocephalic and atraumatic.  Cardiovascular: Normal rate, regular rhythm, normal heart sounds and intact distal pulses.   Pulmonary/Chest: Effort normal and breath sounds normal.  Musculoskeletal: She exhibits no edema.       Right knee: She exhibits swelling.       Legs: Neurological: She is alert and oriented to person, place, and time.  Skin: Skin is warm and  dry. No rash noted.  Psychiatric: She has a normal mood and affect.    Lab Results  Component Value Date   HGBA1C 11.20 05/02/2015   Lab Results  Component Value Date   HGBA1C 12.4 01/26/2016    CBG 284 Assessment & Plan:   Tina Phelps was seen today for knee pain.  Diagnoses and all orders for this visit:  Uncontrolled type 2 diabetes mellitus with complication, without long-term current use of insulin (HCC) -     HgB A1c -     Glucose (CBG) -     lisinopril (PRINIVIL,ZESTRIL) 20 MG tablet; Take 1 tablet (20 mg total) by mouth daily.  Essential hypertension, benign -     lisinopril (PRINIVIL,ZESTRIL) 20 MG tablet; Take 1 tablet (20 mg total) by mouth daily.  Primary osteoarthritis of right knee -     diclofenac sodium (VOLTAREN) 1 % GEL; Apply 2 g topically 4 (four) times daily.    No orders of the defined types were placed in this encounter.   Follow-up: Return in about 3 months (around 04/27/2016) for diabetes .   Boykin Nearing MD

## 2016-01-26 NOTE — Patient Instructions (Addendum)
Tina Phelps was seen today for knee pain.  Diagnoses and all orders for this visit:  Uncontrolled type 2 diabetes mellitus with complication, without long-term current use of insulin (HCC) -     HgB A1c -     Glucose (CBG) -     lisinopril (PRINIVIL,ZESTRIL) 20 MG tablet; Take 1 tablet (20 mg total) by mouth daily.  Essential hypertension, benign -     lisinopril (PRINIVIL,ZESTRIL) 20 MG tablet; Take 1 tablet (20 mg total) by mouth daily.  Primary osteoarthritis of right knee -     diclofenac sodium (VOLTAREN) 1 % GEL; Apply 2 g topically 4 (four) times daily.   Diabetes blood sugar goals  Fasting (in AM before breakfast, 8 hrs of no eating or drinking (except water or unsweetened coffee or tea): 90-110 2 hrs after meals: < 160,   No low sugars: nothing < 70    F/u in 3 months for  Diabetes   Dr. Adrian Blackwater  Osteoarthritis Osteoarthritis is a disease that causes soreness and inflammation of a joint. It occurs when the cartilage at the affected joint wears down. Cartilage acts as a cushion, covering the ends of bones where they meet to form a joint. Osteoarthritis is the most common form of arthritis. It often occurs in older people. The joints affected most often by this condition include those in the:  Ends of the fingers.  Thumbs.  Neck.  Lower back.  Knees.  Hips. CAUSES  Over time, the cartilage that covers the ends of bones begins to wear away. This causes bone to rub on bone, producing pain and stiffness in the affected joints.  RISK FACTORS Certain factors can increase your chances of having osteoarthritis, including:  Older age.  Excessive body weight.  Overuse of joints.  Previous joint injury. SIGNS AND SYMPTOMS   Pain, swelling, and stiffness in the joint.  Over time, the joint may lose its normal shape.  Small deposits of bone (osteophytes) may grow on the edges of the joint.  Bits of bone or cartilage can break off and float inside the joint space. This  may cause more pain and damage. DIAGNOSIS  Your health care provider will do a physical exam and ask about your symptoms. Various tests may be ordered, such as:  X-rays of the affected joint.  Blood tests to rule out other types of arthritis. Additional tests may be used to diagnose your condition. TREATMENT  Goals of treatment are to control pain and improve joint function. Treatment plans may include:  A prescribed exercise program that allows for rest and joint relief.  A weight control plan.  Pain relief techniques, such as:  Properly applied heat and cold.  Electric pulses delivered to nerve endings under the skin (transcutaneous electrical nerve stimulation [TENS]).  Massage.  Certain nutritional supplements.  Medicines to control pain, such as:  Acetaminophen.  Nonsteroidal anti-inflammatory drugs (NSAIDs), such as naproxen.  Narcotic or central-acting agents, such as tramadol.  Corticosteroids. These can be given orally or as an injection.  Surgery to reposition the bones and relieve pain (osteotomy) or to remove loose pieces of bone and cartilage. Joint replacement may be needed in advanced states of osteoarthritis. HOME CARE INSTRUCTIONS   Take medicines only as directed by your health care provider.  Maintain a healthy weight. Follow your health care provider's instructions for weight control. This may include dietary instructions.  Exercise as directed. Your health care provider can recommend specific types of exercise. These may include:  Strengthening exercises. These are done to strengthen the muscles that support joints affected by arthritis. They can be performed with weights or with exercise bands to add resistance.  Aerobic activities. These are exercises, such as brisk walking or low-impact aerobics, that get your heart pumping.  Range-of-motion activities. These keep your joints limber.  Balance and agility exercises. These help you maintain  daily living skills.  Rest your affected joints as directed by your health care provider.  Keep all follow-up visits as directed by your health care provider. SEEK MEDICAL CARE IF:   Your skin turns red.  You develop a rash in addition to your joint pain.  You have worsening joint pain.  You have a fever along with joint or muscle aches. SEEK IMMEDIATE MEDICAL CARE IF:  You have a significant loss of weight or appetite.  You have night sweats. [place] of Arthritis and Musculoskeletal and Skin Diseases: www.niams.SouthExposed.es  [employer] on Aging: http://kim-miller.com/  American College of Rheumatology: www.rheumatology.org   This information is not intended to replace advice given to you by your health care provider. Make sure you discuss any questions you have with your health care provider.   Document Released: 06/17/2005 Document Revised: 07/08/2014 Document Reviewed: 02/22/2013 Elsevier Interactive Patient Education [employer].

## 2016-01-28 NOTE — Assessment & Plan Note (Signed)
Controlled on 20 mg of lisinopril Continue lisinopril 20 mg daily

## 2016-01-28 NOTE — Assessment & Plan Note (Signed)
Hx and exam consistent with OA Counseled patient on OA Advised Staying active Topical NSAID Tylenol or oral NSAID if topicals no longer help

## 2016-01-28 NOTE — Assessment & Plan Note (Signed)
Uncontrolled diabetes With rise in A1c  Recommended  Basal insulin-lantus 10 U daily- patient refused Recommended adding [place]- patient refused  Plan: Continue current regimen Increase exercise

## 2016-02-07 ENCOUNTER — Other Ambulatory Visit: Payer: Self-pay | Admitting: Family Medicine

## 2016-02-07 DIAGNOSIS — E785 Hyperlipidemia, unspecified: Secondary | ICD-10-CM

## 2016-02-07 DIAGNOSIS — K219 Gastro-esophageal reflux disease without esophagitis: Secondary | ICD-10-CM

## 2016-02-07 MED FILL — metFORMIN HCL 1000 MG TABS: 1000 | 60 days supply | Qty: 120 | Fill #4

## 2016-02-07 MED FILL — PRAVASTATIN NA 40 MG TAB: 40 | 30 days supply | Qty: 30 | Fill #0

## 2016-02-07 MED FILL — GLIMEPIRIDE 4 MG TABLET: 4 | 60 days supply | Qty: 120 | Fill #4

## 2016-02-07 MED FILL — PANTOPRAZOLE SOD DR 20 MG T: 20 | 30 days supply | Qty: 30 | Fill #0

## 2016-02-07 MED FILL — BRIMONIDINE 0.2% EYE DROP: 0.2 | 90 days supply | Qty: 15 | Fill #2

## 2016-02-09 MED FILL — DORZOLAMIDE-TIMOLOL EYE DRP: 22.3-6.8 | 25 days supply | Qty: 10 | Fill #0

## 2016-02-09 MED FILL — LATANOPROST 0.005% EYE DRP: 0.005 | 25 days supply | Qty: 3 | Fill #0

## 2016-04-15 MED FILL — ?LISINOPRIL 20 MG TABLET: 20 | 30 days supply | Qty: 30 | Fill #1

## 2016-04-15 MED FILL — ?GLIMEPIRIDE 4 MG TABLET: 4 | 60 days supply | Qty: 120 | Fill #5

## 2016-04-15 MED FILL — PRAVASTATIN NA 40 MG TAB: 40 | 30 days supply | Qty: 30 | Fill #1

## 2016-04-15 MED FILL — metFORMIN HCL 1000 MG TABS: 1000 | 60 days supply | Qty: 120 | Fill #5

## 2016-05-03 MED FILL — BRIMONIDINE 0.2% EYE DROP: 0.2 | 89 days supply | Qty: 30 | Fill #0

## 2016-05-03 MED FILL — LATANOPROST 0.005% EYE DRP: 0.005 | 89 days supply | Qty: 8 | Fill #0

## 2016-05-03 MED FILL — DORZOLAMIDE-TIMOLOL EYE DRP: 22.3-6.8 | 89 days supply | Qty: 30 | Fill #0

## 2016-05-10 ENCOUNTER — Ambulatory Visit: Payer: Self-pay | Attending: Internal Medicine

## 2016-05-27 ENCOUNTER — Other Ambulatory Visit: Payer: Self-pay | Admitting: Family Medicine

## 2016-05-27 DIAGNOSIS — E118 Type 2 diabetes mellitus with unspecified complications: Principal | ICD-10-CM

## 2016-05-27 DIAGNOSIS — E1165 Type 2 diabetes mellitus with hyperglycemia: Secondary | ICD-10-CM

## 2016-05-27 DIAGNOSIS — IMO0002 Reserved for concepts with insufficient information to code with codable children: Secondary | ICD-10-CM

## 2016-05-27 MED FILL — PANTOPRAZOLE SOD DR 20 MG T: 20 | 30 days supply | Qty: 30 | Fill #1

## 2016-05-27 MED FILL — LISINOPRIL 20 MG TABLET: 20 | 30 days supply | Qty: 30 | Fill #2

## 2016-05-27 MED FILL — ?PRAVASTATIN NA 40 MG TAB: 40 MG | 30 days supply | Qty: 30 | Fill #2

## 2016-05-29 ENCOUNTER — Telehealth: Payer: Self-pay | Admitting: Family Medicine

## 2016-05-29 DIAGNOSIS — K219 Gastro-esophageal reflux disease without esophagitis: Secondary | ICD-10-CM

## 2016-05-29 DIAGNOSIS — E785 Hyperlipidemia, unspecified: Secondary | ICD-10-CM

## 2016-05-29 DIAGNOSIS — IMO0002 Reserved for concepts with insufficient information to code with codable children: Secondary | ICD-10-CM

## 2016-05-29 DIAGNOSIS — E118 Type 2 diabetes mellitus with unspecified complications: Secondary | ICD-10-CM

## 2016-05-29 DIAGNOSIS — E1165 Type 2 diabetes mellitus with hyperglycemia: Secondary | ICD-10-CM

## 2016-05-29 DIAGNOSIS — I10 Essential (primary) hypertension: Secondary | ICD-10-CM

## 2016-05-29 DIAGNOSIS — M1711 Unilateral primary osteoarthritis, right knee: Secondary | ICD-10-CM

## 2016-05-29 NOTE — Telephone Encounter (Signed)
I will defer to Dr. Adrian Blackwater, will forward request to her.

## 2016-05-29 NOTE — Telephone Encounter (Signed)
Pt. Came into facility requesting a refill on all her current medications. Pt. Was last seen in July. Pt. Is requesting a three month supply b/c she is going out of the [place]. She was told that it would not be possible to do that b/c she has not been here. Pt. Was told to call back Monday 12/4 for an appointment and she stated that she would be leaving on 12/12. Please f/u

## 2016-05-30 MED ORDER — METFORMIN HCL 1000 MG PO TABS: ORAL_TABLET | ORAL | 3 refills | Status: DC

## 2016-05-30 MED ORDER — PANTOPRAZOLE SODIUM 20 MG PO TBEC: 20.0000 mg | DELAYED_RELEASE_TABLET | Freq: Every day | ORAL | 3 refills | Status: DC

## 2016-05-30 MED ORDER — LISINOPRIL 20 MG PO TABS: 20.0000 mg | ORAL_TABLET | Freq: Every day | ORAL | 3 refills | Status: DC

## 2016-05-30 MED ORDER — PRAVASTATIN SODIUM 40 MG PO TABS: 40.0000 mg | ORAL_TABLET | Freq: Every day | ORAL | 3 refills | Status: DC

## 2016-05-30 MED ORDER — DICLOFENAC SODIUM 1 % TD GEL: 2.0000 g | Freq: Four times a day (QID) | TRANSDERMAL | 3 refills | Status: DC

## 2016-05-30 MED FILL — DICLOFENAC SODIUM 1% GEL: 1 | 37 days supply | Qty: 300 | Fill #0

## 2016-05-30 NOTE — Telephone Encounter (Signed)
Please inform patient  I agree that patient should have f.u appt prior to travel if possible. Her diabetes was uncontrolled at last office visit.   Refilled meds, 90 day supply ordered Eye drops refills need to come from her ophthalmologist

## 2016-06-03 ENCOUNTER — Other Ambulatory Visit: Payer: Self-pay | Admitting: Family Medicine

## 2016-06-03 DIAGNOSIS — E118 Type 2 diabetes mellitus with unspecified complications: Principal | ICD-10-CM

## 2016-06-03 DIAGNOSIS — IMO0002 Reserved for concepts with insufficient information to code with codable children: Secondary | ICD-10-CM

## 2016-06-03 DIAGNOSIS — E1165 Type 2 diabetes mellitus with hyperglycemia: Secondary | ICD-10-CM

## 2016-06-03 MED FILL — metFORMIN HCL 1000 MG TABS: 1000 | 30 days supply | Qty: 60 | Fill #0

## 2016-06-06 MED FILL — GLIMEPIRIDE 4 MG TABLET: 4 | 30 days supply | Qty: 60 | Fill #0

## 2016-11-13 ENCOUNTER — Encounter: Payer: Self-pay | Admitting: Family Medicine

## 2016-11-15 ENCOUNTER — Ambulatory Visit: Payer: Medicare Other | Attending: Family Medicine | Admitting: Family Medicine

## 2016-11-15 ENCOUNTER — Encounter: Payer: Self-pay | Admitting: Family Medicine

## 2016-11-15 VITALS — BP 120/60 | HR 76 | Temp 98.3°F | Wt 144.0 lb

## 2016-11-15 DIAGNOSIS — Z79899 Other long term (current) drug therapy: Secondary | ICD-10-CM | POA: Insufficient documentation

## 2016-11-15 DIAGNOSIS — M25511 Pain in right shoulder: Secondary | ICD-10-CM | POA: Insufficient documentation

## 2016-11-15 DIAGNOSIS — Z905 Acquired absence of kidney: Secondary | ICD-10-CM | POA: Insufficient documentation

## 2016-11-15 DIAGNOSIS — E1165 Type 2 diabetes mellitus with hyperglycemia: Secondary | ICD-10-CM | POA: Diagnosis not present

## 2016-11-15 DIAGNOSIS — E118 Type 2 diabetes mellitus with unspecified complications: Secondary | ICD-10-CM

## 2016-11-15 DIAGNOSIS — Z794 Long term (current) use of insulin: Secondary | ICD-10-CM | POA: Diagnosis not present

## 2016-11-15 DIAGNOSIS — Z1159 Encounter for screening for other viral diseases: Secondary | ICD-10-CM

## 2016-11-15 DIAGNOSIS — M1711 Unilateral primary osteoarthritis, right knee: Secondary | ICD-10-CM | POA: Diagnosis not present

## 2016-11-15 DIAGNOSIS — IMO0002 Reserved for concepts with insufficient information to code with codable children: Secondary | ICD-10-CM

## 2016-11-15 DIAGNOSIS — E785 Hyperlipidemia, unspecified: Secondary | ICD-10-CM

## 2016-11-15 DIAGNOSIS — I10 Essential (primary) hypertension: Secondary | ICD-10-CM | POA: Diagnosis not present

## 2016-11-15 DIAGNOSIS — G8929 Other chronic pain: Secondary | ICD-10-CM

## 2016-11-15 DIAGNOSIS — E1169 Type 2 diabetes mellitus with other specified complication: Secondary | ICD-10-CM

## 2016-11-15 LAB — POCT GLYCOSYLATED HEMOGLOBIN (HGB A1C): HEMOGLOBIN A1C: 12.3

## 2016-11-15 LAB — GLUCOSE, POCT (MANUAL RESULT ENTRY): POC Glucose: 161 mg/dl — AB (ref 70–99)

## 2016-11-15 MED ORDER — INSULIN PEN NEEDLE 31G X 8 MM MISC: 1.0000 "application " | Freq: Every day | 5 refills | Status: DC

## 2016-11-15 MED ORDER — INSULIN GLARGINE 100 UNIT/ML SOLOSTAR PEN: 5.0000 [IU] | PEN_INJECTOR | Freq: Every day | SUBCUTANEOUS | 2 refills | Status: DC

## 2016-11-15 MED ORDER — LISINOPRIL 20 MG PO TABS: 20.0000 mg | ORAL_TABLET | Freq: Every day | ORAL | 3 refills | Status: DC

## 2016-11-15 MED ORDER — DICLOFENAC SODIUM 1 % TD GEL: 2.0000 g | Freq: Four times a day (QID) | TRANSDERMAL | 3 refills | Status: DC

## 2016-11-15 MED ORDER — METFORMIN HCL ER 500 MG PO TB24: 1000.0000 mg | ORAL_TABLET | Freq: Every day | ORAL | 2 refills | Status: DC

## 2016-11-15 MED FILL — VOLTAREN 1% GEL: 1 | 37 days supply | Qty: 300 | Fill #0

## 2016-11-15 MED FILL — LISINOPRIL 20 MG TABLET: 20 | 90 days supply | Qty: 90 | Fill #0

## 2016-11-15 MED FILL — BRIMONIDINE 0.2% EYE DROP: 0.2 | 90 days supply | Qty: 30 | Fill #0

## 2016-11-15 MED FILL — LATANOPROST 0.005% EYE DRP: 0.005 | 83 days supply | Qty: 8 | Fill #0

## 2016-11-15 MED FILL — TRUEPLUS PEN NDL 31GX5/16: 31G X 8 MM | 90 days supply | Qty: 300 | Fill #0

## 2016-11-15 MED FILL — TIMOLOL 0.5% EYE DROPS: 0.5 | 90 days supply | Qty: 30 | Fill #0

## 2016-11-15 MED FILL — TRUEPLUS PEN NDL 31GX5/16": 31G X 8 MM | 90 days supply | Qty: 300 | Fill #0

## 2016-11-15 MED FILL — METFORMIN HCL ER 500 MG TAB: 500 | 90 days supply | Qty: 180 | Fill #0

## 2016-11-15 MED FILL — PRAVASTATIN NA 40 MG TAB: 40 | 90 days supply | Qty: 90 | Fill #0

## 2016-11-15 MED FILL — !LANTUS SOLOSTAR 100UNITS/M: 100 | 119 days supply | Qty: 6 | Fill #0

## 2016-11-15 NOTE — Assessment & Plan Note (Addendum)
A; uncontrolled on metformin and amaryl with no improvement over the past year. She has been resistant to insulin therapy but is now amenable to once daily basal insulin. She is fasting for Ramadan  P: lantus 5 U nightly Metformin 500 mg XR, 2 tabs nightly D/c amaryl to avoid low CBGs CBG monitoring at home

## 2016-11-15 NOTE — Patient Instructions (Addendum)
Tina Phelps was seen today for diabetes.  Diagnoses and all orders for this visit:  Uncontrolled type 2 diabetes mellitus with complication, without long-term current use of insulin (HCC) -     POCT glucose (manual entry) -     POCT glycosylated hemoglobin (Hb A1C) -     Lipid Panel -     CMP14+EGFR -     Insulin Glargine (LANTUS SOLOSTAR) 100 UNIT/ML Solostar Pen; Inject 5 Units into the skin daily at 10 pm. -     metFORMIN (GLUCOPHAGE XR) 500 MG 24 hr tablet; Take 2 tablets (1,000 mg total) by mouth daily after supper. -     POCT UA - Microalbumin -     lisinopril (PRINIVIL,ZESTRIL) 20 MG tablet; Take 1 tablet (20 mg total) by mouth daily. -     Insulin Pen Needle (B-D ULTRAFINE III SHORT PEN) 31G X 8 MM MISC; 1 application by Does not apply route daily.  Need for hepatitis C screening test -     Hepatitis c antibody (reflex)  Primary osteoarthritis of right knee -     diclofenac sodium (VOLTAREN) 1 % GEL; Apply 2 g topically 4 (four) times daily.  Chronic right shoulder pain -     diclofenac sodium (VOLTAREN) 1 % GEL; Apply 2 g topically 4 (four) times daily. -     Ambulatory referral to Orthopedic Surgery  Essential hypertension, benign -     lisinopril (PRINIVIL,ZESTRIL) 20 MG tablet; Take 1 tablet (20 mg total) by mouth daily.   Stop Amaryl Start lantus 5 U Start metformin 1000 mg XR  Diabetes blood sugar goals  Fasting (in AM before breakfast, 8 hrs of no eating or drinking (except water or unsweetened coffee or tea): 90-110 2 hrs after meals: < 160,   No low sugars: nothing < 70   F/u in 4 weeks for CBG review clinical pharmacologist F/u with me in 8 weeks   Dr. Adrian Blackwater    Bursitis Bursitis is when the fluid-filled sac (bursa) that covers and protects a joint is swollen (inflamed). Bursitis is most common near joints, especially the knees, elbows, hips, and shoulders. Follow these instructions at home:  Take medicines only as told by your doctor.  If you were  prescribed an antibiotic medicine, finish it all even if you start to feel better.  Rest the affected area as told by your doctor.  Keep the area raised up.  Avoid doing things that make the pain worse.  Apply ice to the injured area:  Place ice in a plastic bag.  Place a towel between your skin and the bag.  Leave the ice on for 20 minutes, 2-3 times a day.  Use splints, braces, pads, or walking aids as told by your doctor.  Keep all follow-up visits as told by your doctor. This is important. Contact a doctor if:  You have more pain with home care.  You have a fever.  You have chills. This information is not intended to replace advice given to you by your health care provider. Make sure you discuss any questions you have with your health care provider. Document Released: 12/05/2009 Document Revised: 11/23/2015 Document Reviewed: 09/06/2013 Elsevier Interactive Patient Education  2017 [employer].

## 2016-11-15 NOTE — Progress Notes (Signed)
Subjective:  Patient ID: Tina Phelps, female    DOB: 09-08-1945  Age: 71 y.o. MRN: [id number]  CC: Diabetes  Tina Phelps Arabic interpreter ID # [id number]  HPI Tina Phelps is a 71 yo F from [place] she has glaucoma, acquired solitary kidney ( L only),  diabetes and HTN, she  presents with her daughter for   1. Diabetes: she is complaint with metformin 100 mg BID and glimepiride 4 mg BID. However, this month she is fasting an only taking evening medications. She denies low blood sugar. She has some GI upset with metformin. She denies decline in vision, polyuria, polydipsia and neuropathy.   2. HTN: taking lisinopril 20 mg daily. No HA, CP, leg swelling or cough.   3. R shoulder pain: x 3 months. Pain with raising arm. No trauma. She is R handed. Anterior and lateral shoulder pain. Pain interferes with grip and activities.   Social History  Substance Use Topics  . Smoking status: Never Smoker  . Smokeless tobacco: Never Used  . Alcohol use No   Outpatient Medications Prior to Visit  Medication Sig Dispense Refill  . Blood Glucose Monitoring Suppl (ONE TOUCH ULTRA SYSTEM KIT) W/DEVICE KIT 1 kit by Does not apply route once. 1 each 0  . brimonidine (ALPHAGAN) 0.2 % ophthalmic solution Place 1 drop into both eyes 3 (three) times daily.    . diclofenac sodium (VOLTAREN) 1 % GEL Apply 2 g topically 4 (four) times daily. 300 g 3  . fluconazole (DIFLUCAN) 150 MG tablet Take 1 tablet (150 mg total) by mouth every 3 (three) days. 2 tablet 0  . glimepiride (AMARYL) 4 MG tablet TAKE 1 TABLET BY MOUTH 2 TIMES DAILY. 60 tablet 0  . glucose blood test strip Use as instructed 100 each 12  . ketoconazole (NIZORAL) 2 % cream Apply 1 application topically daily. 15 g 0  . latanoprost (XALATAN) 0.005 % ophthalmic solution 1 drop at bedtime.    Marland Kitchen lisinopril (PRINIVIL,ZESTRIL) 20 MG tablet Take 1 tablet (20 mg total) by mouth daily. 90 tablet 3  . metFORMIN (GLUCOPHAGE) 1000 MG tablet TAKE 1 TABLET BY MOUTH 2 TIMES  DAILY WITH A MEAL. 180 tablet 3  . pantoprazole (PROTONIX) 20 MG tablet Take 1 tablet (20 mg total) by mouth daily. 90 tablet 3  . pravastatin (PRAVACHOL) 40 MG tablet Take 1 tablet (40 mg total) by mouth daily. 90 tablet 3  . timolol (BETIMOL) 0.5 % ophthalmic solution Place 1 drop into both eyes at bedtime. 10 mL 3  . dorzolamide-timolol (COSOPT) 22.3-6.8 MG/ML ophthalmic solution Place 1 drop into both eyes 2 (two) times daily. (Patient not taking: Reported on 05/02/2015) 10 mL 3   No facility-administered medications prior to visit.     ROS Review of Systems  Constitutional: Negative for chills and fever.  Eyes: Negative for visual disturbance.  Respiratory: Negative for shortness of breath.   Cardiovascular: Negative for chest pain.  Gastrointestinal: Negative for abdominal pain and blood in stool.  Musculoskeletal: Positive for arthralgias. Negative for back pain.  Skin: Negative for rash.  Allergic/Immunologic: Negative for immunocompromised state.  Hematological: Negative for adenopathy. Does not bruise/bleed easily.  Psychiatric/Behavioral: Negative for dysphoric mood and suicidal ideas.    Objective:  BP 120/60   Pulse 76   Temp 98.3 F (36.8 C) (Oral)   Wt 144 lb (65.3 kg)   SpO2 100%   BMI 25.51 kg/m   BP/Weight 11/15/2016 01/26/2016 13/01/6577  Systolic BP 469 629 528  Diastolic BP 60 78 72  Wt. (Lbs) 144 142.6 -  BMI 25.51 25.26 -    Physical Exam  Constitutional: She is oriented to person, place, and time. She appears well-developed and well-nourished. No distress.  HENT:  Head: Normocephalic and atraumatic.  Cardiovascular: Normal rate, regular rhythm, normal heart sounds and intact distal pulses.   Pulmonary/Chest: Effort normal and breath sounds normal.  Musculoskeletal: She exhibits no edema.       Right shoulder: She exhibits decreased range of motion, tenderness and pain. She exhibits no bony tenderness, no swelling, no effusion, no crepitus, no  deformity, no laceration, no spasm, normal pulse and normal strength.  Neurological: She is alert and oriented to person, place, and time.  Skin: Skin is warm and dry. No rash noted.  Psychiatric: She has a normal mood and affect.    Lab Results  Component Value Date   HGBA1C 12.4 01/26/2016   Lab Results  Component Value Date   HGBA1C 12.3 11/15/2016    CBG 161    Chemistry      Component Value Date/Time   NA 138 09/30/2014 1749   K 4.1 09/30/2014 1749   CL 102 09/30/2014 1749   CO2 26 09/30/2014 1749   BUN 13 09/30/2014 1749   CREATININE 0.81 09/30/2014 1749      Component Value Date/Time   CALCIUM 9.3 09/30/2014 1749   ALKPHOS 103 09/30/2014 1749   AST 16 09/30/2014 1749   ALT 13 09/30/2014 1749   BILITOT 0.4 09/30/2014 1749     Lab Results  Component Value Date   CHOL 214 (H) 12/15/2013   HDL 67 12/15/2013   LDLCALC 121 (H) 12/15/2013   TRIG 130 12/15/2013   CHOLHDL 3.2 12/15/2013     Assessment & Plan:   [name] was seen today for diabetes.  Diagnoses and all orders for this visit:  Uncontrolled type 2 diabetes mellitus with complication, without long-term current use of insulin (HCC) -     POCT glucose (manual entry) -     POCT glycosylated hemoglobin (Hb A1C) -     Lipid Panel -     CMP14+EGFR -     Insulin Glargine (LANTUS SOLOSTAR) 100 UNIT/ML Solostar Pen; Inject 5 Units into the skin daily at 10 pm. -     metFORMIN (GLUCOPHAGE XR) 500 MG 24 hr tablet; Take 2 tablets (1,000 mg total) by mouth daily after supper. -     POCT UA - Microalbumin -     lisinopril (PRINIVIL,ZESTRIL) 20 MG tablet; Take 1 tablet (20 mg total) by mouth daily. -     Insulin Pen Needle (B-D ULTRAFINE III SHORT PEN) 31G X 8 MM MISC; 1 application by Does not apply route daily.  Need for hepatitis C screening test -     Hepatitis c antibody (reflex)  Primary osteoarthritis of right knee -     diclofenac sodium (VOLTAREN) 1 % GEL; Apply 2 g topically 4 (four) times  daily.  Chronic right shoulder pain -     diclofenac sodium (VOLTAREN) 1 % GEL; Apply 2 g topically 4 (four) times daily. -     Ambulatory referral to Orthopedic Surgery  Essential hypertension, benign -     lisinopril (PRINIVIL,ZESTRIL) 20 MG tablet; Take 1 tablet (20 mg total) by mouth daily.    No orders of the defined types were placed in this encounter.   Follow-up: Return in about 4 weeks (around 12/13/2016) for CBG check .   Timber Lucarelli  Ahyana Skillin MD

## 2016-11-15 NOTE — Assessment & Plan Note (Signed)
A R shoulder pain with decreased ROM consistent with bursitis P: voltaren gel, avoid oral NSAID's as she has 1 kidney Ortho referral for possible steroid injection

## 2016-11-15 NOTE — Assessment & Plan Note (Signed)
Controlled Continue lisinopril 20 mg daily Checking CMP

## 2016-11-16 LAB — CMP14+EGFR
ALT: 11 IU/L (ref 0–32)
AST: 15 IU/L (ref 0–40)
Albumin/Globulin Ratio: 1.5 (ref 1.2–2.2)
Albumin: 4.1 g/dL (ref 3.6–4.8)
Alkaline Phosphatase: 106 IU/L (ref 39–117)
BUN/Creatinine Ratio: 18 (ref 12–28)
BUN: 18 mg/dL (ref 8–27)
Bilirubin Total: 0.4 mg/dL (ref 0.0–1.2)
CALCIUM: 9.8 mg/dL (ref 8.7–10.3)
CO2: 23 mmol/L (ref 18–29)
CREATININE: 0.99 mg/dL (ref 0.57–1.00)
Chloride: 99 mmol/L (ref 96–106)
GFR calc Af Amer: 68 mL/min/{1.73_m2} (ref >59)
GFR, EST NON AFRICAN AMERICAN: 59 mL/min/{1.73_m2} — AB (ref >59)
GLUCOSE: 182 mg/dL — AB (ref 65–99)
Globulin, Total: 2.7 g/dL (ref 1.5–4.5)
Potassium: 4 mmol/L (ref 3.5–5.2)
Sodium: 137 mmol/L (ref 134–144)
TOTAL PROTEIN: 6.8 g/dL (ref 6.0–8.5)

## 2016-11-16 LAB — HEPATITIS C ANTIBODY (REFLEX): HCV Ab: <0.1 s/co ratio (ref 0.0–0.9)

## 2016-11-16 LAB — LIPID PANEL
CHOLESTEROL TOTAL: 206 mg/dL — AB (ref 100–199)
Chol/HDL Ratio: 3.2 ratio (ref 0.0–4.4)
HDL: 64 mg/dL (ref >39)
LDL CALC: 116 mg/dL — AB (ref 0–99)
TRIGLYCERIDES: 132 mg/dL (ref 0–149)
VLDL CHOLESTEROL CAL: 26 mg/dL (ref 5–40)

## 2016-11-16 LAB — HCV COMMENT:

## 2016-11-25 DIAGNOSIS — E1169 Type 2 diabetes mellitus with other specified complication: Secondary | ICD-10-CM | POA: Insufficient documentation

## 2016-11-25 DIAGNOSIS — E785 Hyperlipidemia, unspecified: Secondary | ICD-10-CM

## 2016-11-25 MED ORDER — ROSUVASTATIN CALCIUM 20 MG PO TABS: 20.0000 mg | ORAL_TABLET | Freq: Every day | ORAL | 3 refills | Status: DC

## 2016-11-25 NOTE — Addendum Note (Signed)
Addended by: Boykin Nearing on: 11/25/2016 03:22 PM   Modules accepted: Orders

## 2016-11-25 NOTE — Assessment & Plan Note (Signed)
crestor 20 mg daily ordered as high intensity statin recommended  Patient to stop pravastin 40 mg daily

## 2016-12-04 ENCOUNTER — Telehealth: Payer: Self-pay

## 2016-12-04 NOTE — Telephone Encounter (Signed)
Pt was called and informed of lab results.  [place]

## 2016-12-06 MED FILL — PANTOPRAZOLE SOD DR 20 MG T: 20 | 30 days supply | Qty: 30 | Fill #2

## 2017-02-19 ENCOUNTER — Other Ambulatory Visit: Payer: Self-pay | Admitting: Family Medicine

## 2017-02-19 ENCOUNTER — Ambulatory Visit: Payer: Medicare Other | Attending: Family Medicine | Admitting: Family Medicine

## 2017-02-19 ENCOUNTER — Encounter: Payer: Self-pay | Admitting: Family Medicine

## 2017-02-19 VITALS — BP 116/72 | HR 73 | Temp 98.1°F | Resp 18 | Ht 63.0 in | Wt 140.6 lb

## 2017-02-19 DIAGNOSIS — E1165 Type 2 diabetes mellitus with hyperglycemia: Secondary | ICD-10-CM | POA: Diagnosis not present

## 2017-02-19 DIAGNOSIS — Z760 Encounter for issue of repeat prescription: Secondary | ICD-10-CM | POA: Insufficient documentation

## 2017-02-19 DIAGNOSIS — E118 Type 2 diabetes mellitus with unspecified complications: Principal | ICD-10-CM

## 2017-02-19 DIAGNOSIS — E785 Hyperlipidemia, unspecified: Secondary | ICD-10-CM | POA: Diagnosis not present

## 2017-02-19 DIAGNOSIS — M25541 Pain in joints of right hand: Secondary | ICD-10-CM | POA: Insufficient documentation

## 2017-02-19 DIAGNOSIS — Z794 Long term (current) use of insulin: Secondary | ICD-10-CM | POA: Insufficient documentation

## 2017-02-19 DIAGNOSIS — E1169 Type 2 diabetes mellitus with other specified complication: Secondary | ICD-10-CM

## 2017-02-19 DIAGNOSIS — I10 Essential (primary) hypertension: Secondary | ICD-10-CM | POA: Diagnosis not present

## 2017-02-19 DIAGNOSIS — IMO0002 Reserved for concepts with insufficient information to code with codable children: Secondary | ICD-10-CM

## 2017-02-19 LAB — POCT GLYCOSYLATED HEMOGLOBIN (HGB A1C): HEMOGLOBIN A1C: 11.7

## 2017-02-19 LAB — GLUCOSE, POCT (MANUAL RESULT ENTRY): POC GLUCOSE: 345 mg/dL — AB (ref 70–99)

## 2017-02-19 MED ORDER — TRUE METRIX METER W/DEVICE KIT: 1.0000 | PACK | Freq: Once | 0 refills | Status: AC

## 2017-02-19 MED ORDER — TRUEPLUS LANCETS 28G MISC: 1.0000 | Freq: Once | 12 refills | Status: AC

## 2017-02-19 MED ORDER — PANTOPRAZOLE SODIUM 20 MG PO TBEC: 20.0000 mg | DELAYED_RELEASE_TABLET | Freq: Every day | ORAL | 3 refills | Status: DC

## 2017-02-19 MED ORDER — DICLOFENAC SODIUM 1 % TD GEL: 2.0000 g | Freq: Four times a day (QID) | TRANSDERMAL | 3 refills | Status: DC

## 2017-02-19 MED ORDER — METFORMIN HCL ER 500 MG PO TB24: 1000.0000 mg | ORAL_TABLET | Freq: Two times a day (BID) | ORAL | 2 refills | Status: DC

## 2017-02-19 MED ORDER — INSULIN GLARGINE 100 UNIT/ML SOLOSTAR PEN: 12.0000 [IU] | PEN_INJECTOR | Freq: Every day | SUBCUTANEOUS | 3 refills | Status: DC

## 2017-02-19 MED ORDER — LISINOPRIL 20 MG PO TABS: 20.0000 mg | ORAL_TABLET | Freq: Every day | ORAL | 3 refills | Status: DC

## 2017-02-19 MED ORDER — GLUCOSE BLOOD VI STRP: ORAL_STRIP | 12 refills | Status: DC

## 2017-02-19 MED FILL — LATANOPROST 0.005% EYE DRP: 0.005 | 83 days supply | Qty: 8 | Fill #1

## 2017-02-19 MED FILL — DORZOLAMIDE HCL 2% EYE DRP: 2 | 80 days supply | Qty: 30 | Fill #0

## 2017-02-19 MED FILL — TRUEPLUS PEN NDL 31GX5/16: 31G X 8 MM | 90 days supply | Qty: 300 | Fill #1

## 2017-02-19 MED FILL — VOLTAREN 1% GEL: 1 | 36 days supply | Qty: 300 | Fill #0

## 2017-02-19 MED FILL — !LANTUS SOLOSTAR 100UNITS/M: 100 | 25 days supply | Qty: 3 | Fill #0

## 2017-02-19 MED FILL — METFORMIN HCL ER 500 MG TAB: 500 | 90 days supply | Qty: 360 | Fill #0

## 2017-02-19 MED FILL — TRUEPLUS PEN NDL 31GX5/16": 31G X 8 MM | 90 days supply | Qty: 300 | Fill #1

## 2017-02-19 MED FILL — BRIMONIDINE 0.2% EYE DROP: 0.2 | 90 days supply | Qty: 30 | Fill #1

## 2017-02-19 MED FILL — LISINOPRIL 20 MG TAB: 20 | 90 days supply | Qty: 90 | Fill #0

## 2017-02-19 NOTE — Progress Notes (Signed)
Patient decline insulin in office today

## 2017-02-19 NOTE — Patient Instructions (Signed)
Bring blood glucose meter or log to next office visit.   Type 2 Diabetes Mellitus, Self Care, Adult When you have type 2 diabetes (type 2 diabetes mellitus), you must keep your blood sugar (glucose) under control. You can do this with:  Nutrition.  Exercise.  Lifestyle changes.  Medicines or insulin, if needed.  Support from your doctors and others.  How do I manage my blood sugar?  Check your blood sugar level every day, as often as told.  Call your doctor if your blood sugar is above your goal numbers for 2 tests in a row.  Have your A1c (hemoglobin A1c) level checked at least two times a year. Have it checked more often if your doctor tells you to. Your doctor will set treatment goals for you. Generally, you should have these blood sugar levels:  Before meals (preprandial): 80-130 mg/dL (4.4-7.2 mmol/L).  After meals (postprandial): lower than 180 mg/dL (10 mmol/L).  A1c level: less than 7%.  What do I need to know about high blood sugar? High blood sugar is called hyperglycemia. Know the signs of high blood sugar. Signs may include:  Feeling: ? Thirsty. ? Hungry. ? Very tired.  Needing to pee (urinate) more than usual.  Blurry vision.  What do I need to know about low blood sugar? Low blood sugar is called hypoglycemia. This is when blood sugar is at or below 70 mg/dL (3.9 mmol/L). Symptoms may include:  Feeling: ? Hungry. ? Worried or nervous (anxious). ? Sweaty and clammy. ? Confused. ? Dizzy. ? Sleepy. ? Sick to your stomach (nauseous).  Having: ? A fast heartbeat (palpitations). ? A headache. ? A change in your vision. ? Jerky movements that you cannot control (seizure). ? Nightmares. ? Tingling or no feeling (numbness) around the mouth, lips, or tongue.  Having trouble with: ? Talking. ? Paying attention (concentrating). ? Moving (coordination). ? Sleeping.  Shaking.  Passing out (fainting).  Getting upset easily  (irritability).  Treating low blood sugar  To treat low blood sugar, eat or drink something sugary right away. If you can think clearly and swallow safely, follow the 15:15 rule:  Take 15 grams of a fast-acting carb (carbohydrate). Some fast-acting carbs are: ? 1 tube of glucose gel. ? 3 sugar tablets (glucose pills). ? 6-8 pieces of hard candy. ? 4 oz (120 mL) of fruit juice. ? 4 oz (120 mL) regular (not diet) soda.  Check your blood sugar 15 minutes after you take the carb.  If your blood sugar is still at or below 70 mg/dL (3.9 mmol/L), take 15 grams of a carb again.  If your blood sugar does not go above 70 mg/dL (3.9 mmol/L) after 3 tries, get help right away.  After your blood sugar goes back to normal, eat a meal or a snack within 1 hour.  Treating very low blood sugar If your blood sugar is at or below 54 mg/dL (3 mmol/L), you have very low blood sugar (severe hypoglycemia). This is an emergency. Do not wait to see if the symptoms will go away. Get medical help right away. Call your local emergency services (911 in the [place] Do not drive yourself to the hospital. If you have very low blood sugar and you cannot eat or drink, you may need a glucagon shot (injection). A family member or friend should learn how to check your blood sugar and how to give you a glucagon shot. Ask your doctor if you need to have a glucagon shot  kit at home. What else is important to manage my diabetes? Medicine Follow these instructions about insulin and diabetes medicines:  Take them as told by your doctor.  Adjust them as told by your doctor.  Do not run out of them.  Having diabetes can raise your risk for other long-term conditions. These include heart or kidney disease. Your doctor may prescribe medicines to help prevent problems from diabetes. Food   Make healthy food choices. These include: ? Chicken, fish, egg whites, and beans. ? Oats, whole wheat, bulgur, brown rice, quinoa, and  millet. ? Fresh fruits and vegetables. ? Low-fat dairy products. ? Nuts, avocado, olive oil, and canola oil.  Make a food plan with a specialist (dietitian).  Follow instructions from your doctor about what you cannot eat or drink.  Drink enough fluid to keep your pee (urine) clear or pale yellow.  Eat healthy snacks between healthy meals.  Keep track of carbs that you eat. Read food labels. Learn food serving sizes.  Follow your sick day plan when you cannot eat or drink normally. Make this plan with your doctor so it is ready to use. Activity  Exercise at least 3 times a week.  Do not go more than 2 days without exercising.  Talk with your doctor before you start a new exercise. Your doctor may need to adjust your insulin, medicines, or food. Lifestyle   Do not use any tobacco products. These include cigarettes, chewing tobacco, and e-cigarettes.If you need help quitting, ask your doctor.  Ask your doctor how much alcohol is safe for you.  Learn to deal with stress. If you need help with this, ask your doctor. Body care  Stay up to date with your shots (immunizations).  Have your eyes and feet checked by a doctor as often as told.  Check your skin and feet every day. Check for cuts, bruises, redness, blisters, or sores.  Brush your teeth and gums two times a day.  Floss at least one time a day.  Go to the dentist least one time every 6 months.  Stay at a healthy weight. General instructions   Take over-the-counter and prescription medicines only as told by your doctor.  Share your diabetes care plan with: ? Your work or school. ? People you live with.  Check your pee (urine) for ketones: ? When you are sick. ? As told by your doctor.  Carry a card or wear jewelry that says that you have diabetes.  Ask your doctor: ? Do I need to meet with a diabetes educator? ? Where can I find a support group for people with diabetes?  Keep all follow-up visits as  told by your doctor. This is important. Where to find more information: To learn more about diabetes, visit:  American Diabetes Association: www.diabetes.org  American Association of Diabetes Educators: www.diabeteseducator.org/patient-resources  This information is not intended to replace advice given to you by your health care provider. Make sure you discuss any questions you have with your health care provider. Document Released: 10/09/2015 Document Revised: 11/23/2015 Document Reviewed: 07/21/2015 Elsevier Interactive Patient Education  2018 [place] for Gastroesophageal Reflux Disease, Adult When you have gastroesophageal reflux disease (GERD), the foods you eat and your eating habits are very important. Choosing the right foods can help ease your discomfort. What guidelines do I need to follow?  Choose fruits, vegetables, whole grains, and low-fat dairy products.  Choose low-fat meat, fish, and poultry.  Limit fats such as oils,  salad dressings, butter, nuts, and avocado.  Keep a food diary. This helps you identify foods that cause symptoms.  Avoid foods that cause symptoms. These may be different for everyone.  Eat small meals often instead of 3 large meals a day.  Eat your meals slowly, in a place where you are relaxed.  Limit fried foods.  Cook foods using methods other than frying.  Avoid drinking alcohol.  Avoid drinking large amounts of liquids with your meals.  Avoid bending over or lying down until 2-3 hours after eating. What foods are not recommended? These are some foods and drinks that may make your symptoms worse: Vegetables Tomatoes. Tomato juice. Tomato and spaghetti sauce. Chili peppers. Onion and garlic. Horseradish. Fruits Oranges, grapefruit, and lemon (fruit and juice). Meats High-fat meats, fish, and poultry. This includes hot dogs, ribs, ham, sausage, salami, and bacon. Dairy Whole milk and chocolate milk. Sour cream.  Cream. Butter. Ice cream. Cream cheese. Drinks Coffee and tea. Bubbly (carbonated) drinks or energy drinks. Condiments Hot sauce. Barbecue sauce. Sweets/Desserts Chocolate and cocoa. Donuts. Peppermint and spearmint. Fats and Oils High-fat foods. This includes [place] fries and potato chips. Other Vinegar. Strong spices. This includes black pepper, white pepper, red pepper, cayenne, curry powder, cloves, ginger, and chili powder. The items listed above may not be a complete list of foods and drinks to avoid. Contact your dietitian for more information. This information is not intended to replace advice given to you by your health care provider. Make sure you discuss any questions you have with your health care provider. Document Released: 12/17/2011 Document Revised: 11/23/2015 Document Reviewed: 04/21/2013 Elsevier Interactive Patient Education  2017 [employer].

## 2017-02-19 NOTE — Progress Notes (Signed)
Subjective:  Patient ID: Tina Phelps, female    DOB: July 01, 1946  Age: 71 y.o. MRN: [id number]  CC: Diabetes   HPI [name] presents  for follow up of diabetes. Symptoms: none. Patient denies foot ulcerations, nausea, paresthesia of the feet, polydipsia, polyuria, visual disturbances and vomitting.  Evaluation to date has been included: fasting blood sugar, fasting lipid panel, hemoglobin A1C and microalbuminuria.  Home sugars: patient does not check sugars daily. When she does check CBG's range 300'S.Treatment to date: metformin and insulin.She also complains of arthralgias for which has been present for 1 month.Pain is located in the right MCP(s): 1st digit and is described as aching, and is constant .  Associated symptoms include: decreased range of motion, inability to flex joint. The patient has tried nothing for symptoms..  Related to injury:   No.    Outpatient Medications Prior to Visit  Medication Sig Dispense Refill  . brimonidine (ALPHAGAN) 0.2 % ophthalmic solution Place 1 drop into both eyes 3 (three) times daily.    . dorzolamide-timolol (COSOPT) 22.3-6.8 MG/ML ophthalmic solution Place 1 drop into both eyes 2 (two) times daily. (Patient not taking: Reported on 05/02/2015) 10 mL 3  . Insulin Pen Needle (B-D ULTRAFINE III SHORT PEN) 31G X 8 MM MISC 1 application by Does not apply route daily. 30 each 5  . latanoprost (XALATAN) 0.005 % ophthalmic solution 1 drop at bedtime.    . rosuvastatin (CRESTOR) 20 MG tablet Take 1 tablet (20 mg total) by mouth daily. 90 tablet 3  . timolol (BETIMOL) 0.5 % ophthalmic solution Place 1 drop into both eyes at bedtime. 10 mL 3  . Blood Glucose Monitoring Suppl (ONE TOUCH ULTRA SYSTEM KIT) W/DEVICE KIT 1 kit by Does not apply route once. 1 each 0  . diclofenac sodium (VOLTAREN) 1 % GEL Apply 2 g topically 4 (four) times daily. 300 g 3  . fluconazole (DIFLUCAN) 150 MG tablet Take 1 tablet (150 mg total) by mouth every 3 (three) days. 2 tablet 0  .  glucose blood test strip Use as instructed 100 each 12  . Insulin Glargine (LANTUS SOLOSTAR) 100 UNIT/ML Solostar Pen Inject 5 Units into the skin daily at 10 pm. 1 pen 2  . ketoconazole (NIZORAL) 2 % cream Apply 1 application topically daily. 15 g 0  . lisinopril (PRINIVIL,ZESTRIL) 20 MG tablet Take 1 tablet (20 mg total) by mouth daily. 90 tablet 3  . metFORMIN (GLUCOPHAGE XR) 500 MG 24 hr tablet Take 2 tablets (1,000 mg total) by mouth daily after supper. 60 tablet 2  . pantoprazole (PROTONIX) 20 MG tablet Take 1 tablet (20 mg total) by mouth daily. 90 tablet 3   No facility-administered medications prior to visit.     ROS Review of Systems  Constitutional: Negative.   Eyes: Negative.   Respiratory: Negative.   Cardiovascular: Negative.   Gastrointestinal: Negative.   Endocrine: Negative.   Musculoskeletal: Positive for arthralgias.  Neurological: Negative.   Psychiatric/Behavioral: Negative.    Objective:  BP 116/72   Pulse 73   Temp 98.1 F (36.7 C) (Oral)   Resp 18   Ht 5' 3" (1.6 m)   Wt 140 lb 9.6 oz (63.8 kg)   SpO2 97%   BMI 24.91 kg/m   BP/Weight 02/19/2017 11/15/2016 0/81/4481  Systolic BP 856 314 970  Diastolic BP 72 60 78  Wt. (Lbs) 140.6 144 142.6  BMI 24.91 25.51 25.26    Physical Exam  Eyes: Pupils are equal, round,  and reactive to light. Conjunctivae are normal.  Cardiovascular: Normal rate, regular rhythm, normal heart sounds and intact distal pulses.   Pulmonary/Chest: Effort normal and breath sounds normal.  Abdominal: Soft. Bowel sounds are normal.  Musculoskeletal:       Right hand: She exhibits decreased range of motion (1st digit ; cannot flex at MCP joint) and bony tenderness. She exhibits normal capillary refill.  Skin: Skin is warm and dry.  Nursing note and vitals reviewed.   Assessment & Plan:   Problem List Items Addressed This Visit      Cardiovascular and Mediastinum   Essential hypertension, benign (Chronic)   Relevant  Medications   lisinopril (PRINIVIL,ZESTRIL) 20 MG tablet     Endocrine   Diabetes mellitus type 2, uncontrolled, with complications (HCC) - Primary (Chronic)   Encouraged adherence with CBG's checks TID and medication   Increased dosage of lantus and metformin   Follow up in 2 weeks with clinical pharmacist   Follow up with PCP in 3 months.    Relevant Medications   Insulin Glargine (LANTUS SOLOSTAR) 100 UNIT/ML Solostar Pen   metFORMIN (GLUCOPHAGE XR) 500 MG 24 hr tablet   lisinopril (PRINIVIL,ZESTRIL) 20 MG tablet   Other Relevant Orders   Lipid Panel (Completed)   Hyperlipidemia associated with type 2 diabetes mellitus (HCC) (Chronic)   Relevant Medications   Insulin Glargine (LANTUS SOLOSTAR) 100 UNIT/ML Solostar Pen   metFORMIN (GLUCOPHAGE XR) 500 MG 24 hr tablet   lisinopril (PRINIVIL,ZESTRIL) 20 MG tablet   Other Relevant Orders   Glucose (CBG) (Completed)   HgB A1c (Completed)   Lipid Panel (Completed)    Other Visit Diagnoses    Arthralgia of right hand       Relevant Medications  diclofenac sodium (VOLTAREN) 1 % GEL     Relevant Orders   Rheumatoid factor (Completed)   Medication refill       Relevant Medications   pantoprazole (PROTONIX) 20 MG tablet   diclofenac sodium (VOLTAREN) 1 % GEL      Meds ordered this encounter  Medications  . Insulin Glargine (LANTUS SOLOSTAR) 100 UNIT/ML Solostar Pen    Sig: Inject 12 Units into the skin daily at 10 pm.    Dispense:  1 pen    Refill:  3    Order Specific Question:   Supervising Provider    Answer:   Tresa Garter [id number]  . pantoprazole (PROTONIX) 20 MG tablet    Sig: Take 1 tablet (20 mg total) by mouth daily.    Dispense:  90 tablet    Refill:  3    Order Specific Question:   Supervising Provider    Answer:   Tresa Garter [id number]  . diclofenac sodium (VOLTAREN) 1 % GEL    Sig: Apply 2 g topically 4 (four) times daily.    Dispense:  300 g    Refill:  3    Order Specific Question:    Supervising Provider    Answer:   Tresa Garter [id number]  . metFORMIN (GLUCOPHAGE XR) 500 MG 24 hr tablet    Sig: Take 2 tablets (1,000 mg total) by mouth 2 (two) times daily with a meal.    Dispense:  120 tablet    Refill:  2    Order Specific Question:   Supervising Provider    Answer:   Tresa Garter [id number]  . lisinopril (PRINIVIL,ZESTRIL) 20 MG tablet    Sig: Take 1 tablet (20 mg total) by  mouth daily.    Dispense:  90 tablet    Refill:  3    Order Specific Question:   Supervising Provider    Answer:   Tresa Garter [id number]    Follow-up: Return in about 2 weeks (around 03/05/2017) for DM with Stacy.   Alfonse Spruce FNP

## 2017-02-20 LAB — LIPID PANEL
CHOL/HDL RATIO: 3.7 ratio (ref 0.0–4.4)
Cholesterol, Total: 196 mg/dL (ref 100–199)
HDL: 53 mg/dL (ref >39)
LDL Calculated: 109 mg/dL — ABNORMAL HIGH (ref 0–99)
TRIGLYCERIDES: 168 mg/dL — AB (ref 0–149)
VLDL Cholesterol Cal: 34 mg/dL (ref 5–40)

## 2017-02-20 LAB — RHEUMATOID FACTOR

## 2017-02-20 MED FILL — !TRUE METRIX BLOOD GLUCOSE: 30 days supply | Qty: 1 | Fill #0

## 2017-02-20 MED FILL — TRUEplus LANCETS 28G MISC: 30 days supply | Qty: 100 | Fill #0

## 2017-02-20 MED FILL — TRUE METRIX TEST STRIP: 30 days supply | Qty: 100 | Fill #0

## 2017-02-25 ENCOUNTER — Other Ambulatory Visit: Payer: Self-pay | Admitting: Family Medicine

## 2017-02-25 DIAGNOSIS — E1169 Type 2 diabetes mellitus with other specified complication: Secondary | ICD-10-CM

## 2017-02-25 DIAGNOSIS — E785 Hyperlipidemia, unspecified: Principal | ICD-10-CM

## 2017-02-25 MED ORDER — ROSUVASTATIN CALCIUM 40 MG PO TABS: 40.0000 mg | ORAL_TABLET | Freq: Every day | ORAL | 0 refills | Status: DC

## 2017-02-25 MED FILL — ?ROSUVASTATIN CAL 40MG TAB: 40 | 30 days supply | Qty: 30 | Fill #0

## 2017-03-04 ENCOUNTER — Telehealth: Payer: Self-pay

## 2017-03-04 NOTE — Telephone Encounter (Signed)
-----   Message from Alfonse Spruce, [place] sent at 02/25/2017 12:44 PM EDT ----- -RF test which test for rheumatoid arthritis is negative Cholesterol levels have improved but are still elevated. This can increase your risk of heart disease overtime. Your dosage of rosuvastatin will be increased. Recommend follow up in 3 months   Start eating a diet low in saturated fat. Limit your intake of fried foods, red meats, and whole milk.

## 2017-03-04 NOTE — Telephone Encounter (Signed)
interpreter  name & ID nadaia   CMA call regarding lab results   Patient did not answer but interpreter left a VM stating the reason of the call & to call back

## 2017-03-06 ENCOUNTER — Ambulatory Visit: Payer: Medicare Other | Attending: Family Medicine | Admitting: Pharmacist

## 2017-03-06 DIAGNOSIS — IMO0002 Reserved for concepts with insufficient information to code with codable children: Secondary | ICD-10-CM

## 2017-03-06 DIAGNOSIS — Z794 Long term (current) use of insulin: Secondary | ICD-10-CM | POA: Diagnosis not present

## 2017-03-06 DIAGNOSIS — R351 Nocturia: Secondary | ICD-10-CM | POA: Diagnosis not present

## 2017-03-06 DIAGNOSIS — E119 Type 2 diabetes mellitus without complications: Secondary | ICD-10-CM | POA: Insufficient documentation

## 2017-03-06 DIAGNOSIS — E1165 Type 2 diabetes mellitus with hyperglycemia: Secondary | ICD-10-CM

## 2017-03-06 DIAGNOSIS — E118 Type 2 diabetes mellitus with unspecified complications: Secondary | ICD-10-CM

## 2017-03-06 MED ORDER — INSULIN GLARGINE 100 UNIT/ML SOLOSTAR PEN: 12.0000 [IU] | PEN_INJECTOR | Freq: Every day | SUBCUTANEOUS | 1 refills | Status: DC

## 2017-03-06 MED ORDER — INSULIN GLARGINE 100 UNIT/ML SOLOSTAR PEN: 20.0000 [IU] | PEN_INJECTOR | Freq: Every day | SUBCUTANEOUS | 1 refills | Status: DC

## 2017-03-06 MED FILL — PANTOPRAZOLE SOD DR 20 MG T: 20 | 90 days supply | Qty: 90 | Fill #0

## 2017-03-06 NOTE — Patient Instructions (Addendum)
Thanks for coming to see [place].  Increase Lantus to 20 units daily.  Come back in 2 weeks for blood sugar log   Hypoglycemia Hypoglycemia is when the sugar (glucose) level in the blood is too low. Symptoms of low blood sugar may include:  Feeling: ? Hungry. ? Worried or nervous (anxious). ? Sweaty and clammy. ? Confused. ? Dizzy. ? Sleepy. ? Sick to your stomach (nauseous).  Having: ? A fast heartbeat. ? A headache. ? A change in your vision. ? Jerky movements that you cannot control (seizure). ? Nightmares. ? Tingling or no feeling (numbness) around the mouth, lips, or tongue.  Having trouble with: ? Talking. ? Paying attention (concentrating). ? Moving (coordination). ? Sleeping.  Shaking.  Passing out (fainting).  Getting upset easily (irritability).  Low blood sugar can happen to people who have diabetes and people who do not have diabetes. Low blood sugar can happen quickly, and it can be an emergency. Treating Low Blood Sugar Low blood sugar is often treated by eating or drinking something sugary right away. If you can think clearly and swallow safely, follow the 15:15 rule:  Take 15 grams of a fast-acting carb (carbohydrate). Some fast-acting carbs are: ? 1 tube of glucose gel. ? 3 sugar tablets (glucose pills). ? 6-8 pieces of hard candy. ? 4 oz (120 mL) of fruit juice. ? 4 oz (120 mL) of regular (not diet) soda.  Check your blood sugar 15 minutes after you take the carb.  If your blood sugar is still at or below 70 mg/dL (3.9 mmol/L), take 15 grams of a carb again.  If your blood sugar does not go above 70 mg/dL (3.9 mmol/L) after 3 tries, get help right away.  After your blood sugar goes back to normal, eat a meal or a snack within 1 hour.  Treating Very Low Blood Sugar If your blood sugar is at or below 54 mg/dL (3 mmol/L), you have very low blood sugar (severe hypoglycemia). This is an emergency. Do not wait to see if the symptoms will go away. Get  medical help right away. Call your local emergency services (911 in the [place] Do not drive yourself to the hospital. If you have very low blood sugar and you cannot eat or drink, you may need a glucagon shot (injection). A family member or friend should learn how to check your blood sugar and how to give you a glucagon shot. Ask your doctor if you need to have a glucagon shot kit at home. Follow these instructions at home: General instructions  Avoid any diets that cause you to not eat enough food. Talk with your doctor before you start any new diet.  Take over-the-counter and prescription medicines only as told by your doctor.  Limit alcohol to no more than 1 drink per day for nonpregnant women and 2 drinks per day for men. One drink equals 12 oz of beer, 5 oz of wine, or 1 oz of hard liquor.  Keep all follow-up visits as told by your doctor. This is important. If You Have Diabetes:   Make sure you know the symptoms of low blood sugar.  Always keep a source of sugar with you, such as: ? Sugar. ? Sugar tablets. ? Glucose gel. ? Fruit juice. ? Regular soda (not diet soda). ? Milk. ? Hard candy. ? Honey.  Take your medicines as told.  Follow your exercise and meal plan. ? Eat on time. Do not skip meals. ? Follow your sick  day plan when you cannot eat or drink normally. Make this plan ahead of time with your doctor.  Check your blood sugar as often as told by your doctor. Always check before and after exercise.  Share your diabetes care plan with: ? Your work or school. ? People you live with.  Check your pee (urine) for ketones: ? When you are sick. ? As told by your doctor.  Carry a card or wear jewelry that says you have diabetes. If You Have Low Blood Sugar From Other Causes:   Check your blood sugar as often as told by your doctor.  Follow instructions from your doctor about what you cannot eat or drink. Contact a doctor if:  You have trouble keeping your blood  sugar in your target range.  You have low blood sugar often. Get help right away if:  You still have symptoms after you eat or drink something sugary.  Your blood sugar is at or below 54 mg/dL (3 mmol/L).  You have jerky movements that you cannot control.  You pass out. These symptoms may be an emergency. Do not wait to see if the symptoms will go away. Get medical help right away. Call your local emergency services (911 in the [place] Do not drive yourself to the hospital. This information is not intended to replace advice given to you by your health care provider. Make sure you discuss any questions you have with your health care provider. Document Released: 09/11/2009 Document Revised: 11/23/2015 Document Reviewed: 07/21/2015 Elsevier Interactive Patient Education  [employer].

## 2017-03-06 NOTE — Progress Notes (Signed)
    S:     Chief Complaint  Patient presents with  . Medication Management    Patient arrives in good spirits.  Presents for diabetes evaluation, education, and management at the request of Fredia Beets. Patient was referred on 02/19/17.  Patient was last seen by Primary Care Provider on 02/19/17. Declined interpreter - daughter present and will interpret.  Patient reports adherence with medications.  Current diabetes medications include: Lantus 12 units daily, metformin XL 1000 mg BID.   Patient denies hypoglycemic events.  Patient reported dietary habits: patient's daughter is diabetic so they follow a similar diet in trying to avoid carbohydrates.  Patient reported exercise habits: walks   Patient reports nocturia.  Patient denies neuropathy. Patient denies visual changes. Patient reports self foot exams.    O:  Physical Exam   ROS   Lab Results  Component Value Date   HGBA1C 11.7 02/19/2017   There were no vitals filed for this visit.  Home fasting CBG: 200s 2 hour post-prandial/random CBG: 300s  A/P: Diabetes longstanding currently uncontrolled based on A1c of 11.7. Patient denies hypoglycemic events and is able to verbalize appropriate hypoglycemia management plan. Patient reports adherence with medication. Control is suboptimal due to dietary indiscretion and sedentary lifestyle.   Continue metformin. Increase Lantus to 20 units daily (fastings >200, 8 unit increase appropriate per AT.LANTUS algorithm and the lack of much improvement from last dose increase). Patient to follow up with pharmacy for patient assistance program for Lantus and to try to get multiple pens.  Next A1C anticipated November 2018.    Written patient instructions provided.  Total time in face to face counseling 15 minutes.   Follow up in Pharmacist Clinic Visit in 2 weeks.   Patient seen with Waverly Ferrari, PharmD Candidate

## 2017-03-19 ENCOUNTER — Ambulatory Visit: Payer: Medicare Other | Attending: Family Medicine | Admitting: Pharmacist

## 2017-03-19 DIAGNOSIS — IMO0002 Reserved for concepts with insufficient information to code with codable children: Secondary | ICD-10-CM

## 2017-03-19 DIAGNOSIS — Z048 Encounter for examination and observation for other specified reasons: Secondary | ICD-10-CM | POA: Diagnosis not present

## 2017-03-19 DIAGNOSIS — E1165 Type 2 diabetes mellitus with hyperglycemia: Secondary | ICD-10-CM | POA: Insufficient documentation

## 2017-03-19 DIAGNOSIS — E118 Type 2 diabetes mellitus with unspecified complications: Secondary | ICD-10-CM

## 2017-03-19 MED ORDER — INSULIN GLARGINE 100 UNIT/ML SOLOSTAR PEN: 24.0000 [IU] | PEN_INJECTOR | Freq: Every day | SUBCUTANEOUS | 1 refills | Status: DC

## 2017-03-19 MED FILL — !LANTUS SOLOSTAR 100UNITS/M: 100 | 24 days supply | Qty: 6 | Fill #0

## 2017-03-19 NOTE — Patient Instructions (Addendum)
Thanks for coming to see [place]!  Increase Lantus to 24 units daily  Come back sooner if you have blood sugars greater than 350 or anything lower than 80.  You can follow up in 1 month with me.   You are due for your regular follow up with [place] in 2 months   Carbohydrate Counting for Diabetes Mellitus, Adult Carbohydrate counting is a method for keeping track of how many carbohydrates you eat. Eating carbohydrates naturally increases the amount of sugar (glucose) in the blood. Counting how many carbohydrates you eat helps keep your blood glucose within normal limits, which helps you manage your diabetes (diabetes mellitus). It is important to know how many carbohydrates you can safely have in each meal. This is different for every person. A diet and nutrition specialist (registered dietitian) can help you make a meal plan and calculate how many carbohydrates you should have at each meal and snack. Carbohydrates are found in the following foods:  Grains, such as breads and cereals.  Dried beans and soy products.  Starchy vegetables, such as potatoes, peas, and corn.  Fruit and fruit juices.  Milk and yogurt.  Sweets and snack foods, such as cake, cookies, candy, chips, and soft drinks.  How do I count carbohydrates? There are two ways to count carbohydrates in food. You can use either of the methods or a combination of both. Reading "Nutrition Facts" on packaged food The "Nutrition Facts" list is included on the labels of almost all packaged foods and beverages in the [place] It includes:  The serving size.  Information about nutrients in each serving, including the grams (g) of carbohydrate per serving.  To use the "Nutrition Facts":  Decide how many servings you will have.  Multiply the number of servings by the number of carbohydrates per serving.  The resulting number is the total amount of carbohydrates that you will be having.  Learning standard serving sizes of other  foods When you eat foods containing carbohydrates that are not packaged or do not include "Nutrition Facts" on the label, you need to measure the servings in order to count the amount of carbohydrates:  Measure the foods that you will eat with a food scale or measuring cup, if needed.  Decide how many standard-size servings you will eat.  Multiply the number of servings by 15. Most carbohydrate-rich foods have about 15 g of carbohydrates per serving. ? For example, if you eat 8 oz (170 g) of strawberries, you will have eaten 2 servings and 30 g of carbohydrates (2 servings x 15 g = 30 g).  For foods that have more than one food mixed, such as soups and casseroles, you must count the carbohydrates in each food that is included.  The following list contains standard serving sizes of common carbohydrate-rich foods. Each of these servings has about 15 g of carbohydrates:   hamburger bun or  English muffin.   oz (15 mL) syrup.   oz (14 g) jelly.  1 slice of bread.  1 six-inch tortilla.  3 oz (85 g) cooked rice or pasta.  4 oz (113 g) cooked dried beans.  4 oz (113 g) starchy vegetable, such as peas, corn, or potatoes.  4 oz (113 g) hot cereal.  4 oz (113 g) mashed potatoes or  of a large baked potato.  4 oz (113 g) canned or frozen fruit.  4 oz (120 mL) fruit juice.  4-6 crackers.  6 chicken nuggets.  6 oz (170 g)  unsweetened dry cereal.  6 oz (170 g) plain fat-free yogurt or yogurt sweetened with artificial sweeteners.  8 oz (240 mL) milk.  8 oz (170 g) fresh fruit or one small piece of fruit.  24 oz (680 g) popped popcorn.  Example of carbohydrate counting Sample meal  3 oz (85 g) chicken breast.  6 oz (170 g) brown rice.  4 oz (113 g) corn.  8 oz (240 mL) milk.  8 oz (170 g) strawberries with sugar-free whipped topping. Carbohydrate calculation 1. Identify the foods that contain  carbohydrates: ? Rice. ? Corn. ? Milk. ? Strawberries. 2. Calculate how many servings you have of each food: ? 2 servings rice. ? 1 serving corn. ? 1 serving milk. ? 1 serving strawberries. 3. Multiply each number of servings by 15 g: ? 2 servings rice x 15 g = 30 g. ? 1 serving corn x 15 g = 15 g. ? 1 serving milk x 15 g = 15 g. ? 1 serving strawberries x 15 g = 15 g. 4. Add together all of the amounts to find the total grams of carbohydrates eaten: ? 30 g + 15 g + 15 g + 15 g = 75 g of carbohydrates total. This information is not intended to replace advice given to you by your health care provider. Make sure you discuss any questions you have with your health care provider. Document Released: 06/17/2005 Document Revised: 01/05/2016 Document Reviewed: 11/29/2015 Elsevier Interactive Patient Education  2018 [employer].    Hypoglycemia Hypoglycemia is when the sugar (glucose) level in the blood is too low. Symptoms of low blood sugar may include:  Feeling: ? Hungry. ? Worried or nervous (anxious). ? Sweaty and clammy. ? Confused. ? Dizzy. ? Sleepy. ? Sick to your stomach (nauseous).  Having: ? A fast heartbeat. ? A headache. ? A change in your vision. ? Jerky movements that you cannot control (seizure). ? Nightmares. ? Tingling or no feeling (numbness) around the mouth, lips, or tongue.  Having trouble with: ? Talking. ? Paying attention (concentrating). ? Moving (coordination). ? Sleeping.  Shaking.  Passing out (fainting).  Getting upset easily (irritability).  Low blood sugar can happen to people who have diabetes and people who do not have diabetes. Low blood sugar can happen quickly, and it can be an emergency. Treating Low Blood Sugar Low blood sugar is often treated by eating or drinking something sugary right away. If you can think clearly and swallow safely, follow the 15:15 rule:  Take 15 grams of a fast-acting carb (carbohydrate). Some  fast-acting carbs are: ? 1 tube of glucose gel. ? 3 sugar tablets (glucose pills). ? 6-8 pieces of hard candy. ? 4 oz (120 mL) of fruit juice. ? 4 oz (120 mL) of regular (not diet) soda.  Check your blood sugar 15 minutes after you take the carb.  If your blood sugar is still at or below 70 mg/dL (3.9 mmol/L), take 15 grams of a carb again.  If your blood sugar does not go above 70 mg/dL (3.9 mmol/L) after 3 tries, get help right away.  After your blood sugar goes back to normal, eat a meal or a snack within 1 hour.  Treating Very Low Blood Sugar If your blood sugar is at or below 54 mg/dL (3 mmol/L), you have very low blood sugar (severe hypoglycemia). This is an emergency. Do not wait to see if the symptoms will go away. Get medical help right away. Call your local  emergency services (911 in the [place] Do not drive yourself to the hospital. If you have very low blood sugar and you cannot eat or drink, you may need a glucagon shot (injection). A family member or friend should learn how to check your blood sugar and how to give you a glucagon shot. Ask your doctor if you need to have a glucagon shot kit at home. Follow these instructions at home: General instructions  Avoid any diets that cause you to not eat enough food. Talk with your doctor before you start any new diet.  Take over-the-counter and prescription medicines only as told by your doctor.  Limit alcohol to no more than 1 drink per day for nonpregnant women and 2 drinks per day for men. One drink equals 12 oz of beer, 5 oz of wine, or 1 oz of hard liquor.  Keep all follow-up visits as told by your doctor. This is important. If You Have Diabetes:   Make sure you know the symptoms of low blood sugar.  Always keep a source of sugar with you, such as: ? Sugar. ? Sugar tablets. ? Glucose gel. ? Fruit juice. ? Regular soda (not diet soda). ? Milk. ? Hard candy. ? Honey.  Take your medicines as told.  Follow your  exercise and meal plan. ? Eat on time. Do not skip meals. ? Follow your sick day plan when you cannot eat or drink normally. Make this plan ahead of time with your doctor.  Check your blood sugar as often as told by your doctor. Always check before and after exercise.  Share your diabetes care plan with: ? Your work or school. ? People you live with.  Check your pee (urine) for ketones: ? When you are sick. ? As told by your doctor.  Carry a card or wear jewelry that says you have diabetes. If You Have Low Blood Sugar From Other Causes:   Check your blood sugar as often as told by your doctor.  Follow instructions from your doctor about what you cannot eat or drink. Contact a doctor if:  You have trouble keeping your blood sugar in your target range.  You have low blood sugar often. Get help right away if:  You still have symptoms after you eat or drink something sugary.  Your blood sugar is at or below 54 mg/dL (3 mmol/L).  You have jerky movements that you cannot control.  You pass out. These symptoms may be an emergency. Do not wait to see if the symptoms will go away. Get medical help right away. Call your local emergency services (911 in the [place] Do not drive yourself to the hospital. This information is not intended to replace advice given to you by your health care provider. Make sure you discuss any questions you have with your health care provider. Document Released: 09/11/2009 Document Revised: 11/23/2015 Document Reviewed: 07/21/2015 Elsevier Interactive Patient Education  [employer].

## 2017-03-19 NOTE — Progress Notes (Signed)
    S:     Chief Complaint  Patient presents with  . Medication Management    Patient arrives in good spirits.  Presents for diabetes evaluation, education, and management at the request of Fredia Beets. Patient was referred on 02/19/17.  Patient was last seen by Primary Care Provider on 02/19/17. Declined interpreter - daughter present and will interpret.  Patient reports adherence with medications.  Current diabetes medications include: Lantus 20 units daily, metformin XL 1000 mg BID.   Patient denies hypoglycemic events.  Patient reported dietary habits: patient's daughter is diabetic so they follow a similar diet in trying to avoid carbohydrates.  Patient reported exercise habits: walks   Patient reports nocturia.  Patient denies neuropathy. Patient denies visual changes. Patient reports self foot exams.     O:  Physical Exam   ROS   Lab Results  Component Value Date   HGBA1C 11.7 02/19/2017   There were no vitals filed for this visit.  Home fasting CBG: 160-197 2 hour post-prandial/random CBG: 200-334  A/P: Diabetes longstanding currently uncontrolled based on A1c of 11.7 but home CBGs are improving. Patient denies hypoglycemic events and is able to verbalize appropriate hypoglycemia management plan. Patient reports adherence with medication. Control is suboptimal due to dietary indiscretion and sedentary lifestyle.   Continue metformin and increase Lantus to 24 units. Patient is going to be having transportation issues as her daughter who drives her to her appts plans to have a C-section in November and is very busy right now with work and her own appts. Patient to return to see [facility] for her 3 month follow up early since she will not have transportation in November. Patient to return to see me sooner if she has hypoglycemia or blood sugars >350.  Next A1C anticipated November 2018.    Written patient instructions provided, including information on  dietary changes that could lower blood sugar.  Total time in face to face counseling 15 minutes.   Follow up in Pharmacist Clinic Visit in 2 weeks.   Patient seen with Waverly Ferrari, PharmD Candidate

## 2017-03-27 ENCOUNTER — Other Ambulatory Visit: Payer: Self-pay | Admitting: *Deleted

## 2017-03-27 DIAGNOSIS — E118 Type 2 diabetes mellitus with unspecified complications: Principal | ICD-10-CM

## 2017-03-27 DIAGNOSIS — IMO0002 Reserved for concepts with insufficient information to code with codable children: Secondary | ICD-10-CM

## 2017-03-27 DIAGNOSIS — E1165 Type 2 diabetes mellitus with hyperglycemia: Secondary | ICD-10-CM

## 2017-03-27 MED ORDER — INSULIN GLARGINE 100 UNIT/ML SOLOSTAR PEN: 24.0000 [IU] | PEN_INJECTOR | Freq: Every day | SUBCUTANEOUS | 3 refills | Status: DC

## 2017-03-27 NOTE — Telephone Encounter (Signed)
PRINTED FOR PASS PROGRAM 

## 2017-04-16 ENCOUNTER — Ambulatory Visit: Payer: Self-pay

## 2017-04-24 MED FILL — TRUE METRIX TEST STRIP: 30 days supply | Qty: 100 | Fill #1

## 2017-04-24 MED FILL — LANTUS SOLOSTAR 100 UNITS/M: 100 | 24 days supply | Qty: 6 | Fill #1

## 2017-05-26 ENCOUNTER — Other Ambulatory Visit: Payer: Self-pay | Admitting: Family Medicine

## 2017-05-26 DIAGNOSIS — IMO0002 Reserved for concepts with insufficient information to code with codable children: Secondary | ICD-10-CM

## 2017-05-26 DIAGNOSIS — E118 Type 2 diabetes mellitus with unspecified complications: Principal | ICD-10-CM

## 2017-05-26 DIAGNOSIS — E1165 Type 2 diabetes mellitus with hyperglycemia: Secondary | ICD-10-CM

## 2017-05-26 MED FILL — LATANOPROST 0.005% EYE DRP: 0.005 | 30 days supply | Qty: 3 | Fill #0

## 2017-05-26 MED FILL — LISINOPRIL 20 MG TABLET: 20 | 30 days supply | Qty: 30 | Fill #1

## 2017-05-26 MED FILL — ROSUVASTATIN CALCIUM 20 MG: 20 | 30 days supply | Qty: 30 | Fill #0

## 2017-05-26 MED FILL — DORZOLAMIDE-TIMOLOL EYE DRP: 22.3-6.8 | 30 days supply | Qty: 10 | Fill #0 | Status: TO

## 2017-05-26 MED FILL — METFORMIN HCL ER 500 MG TAB: 500 | 30 days supply | Qty: 120 | Fill #0

## 2017-05-26 MED FILL — LANTUS SOLOSTAR 100 UNITS/M: 100 | 25 days supply | Qty: 6 | Fill #2

## 2017-05-26 MED FILL — PANTOPRAZOLE SOD DR 20 MG T: 20 | 30 days supply | Qty: 30 | Fill #1

## 2017-05-27 ENCOUNTER — Other Ambulatory Visit: Payer: Self-pay | Admitting: Pharmacist

## 2017-05-27 MED ORDER — ACCU-CHEK FASTCLIX LANCETS MISC: 3 refills | Status: DC

## 2017-05-27 MED ORDER — ACCU-CHEK SOFT TOUCH LANCETS MISC: 12 refills | Status: DC

## 2017-05-27 MED ORDER — ACCU-CHEK AVIVA PLUS W/DEVICE KIT: PACK | 0 refills | Status: DC

## 2017-05-27 MED ORDER — GLUCOSE BLOOD VI STRP: ORAL_STRIP | 12 refills | Status: DC

## 2017-05-27 MED FILL — ACCU-CHEK SOFTCLIX LANCETS: 30 days supply | Qty: 100 | Fill #0

## 2017-05-27 MED FILL — ACCU-CHEK AVIVA PLUS TEST S: 30 days supply | Qty: 100 | Fill #0

## 2017-05-30 ENCOUNTER — Telehealth: Payer: Self-pay | Admitting: Family Medicine

## 2017-05-30 DIAGNOSIS — IMO0002 Reserved for concepts with insufficient information to code with codable children: Secondary | ICD-10-CM

## 2017-05-30 DIAGNOSIS — E1165 Type 2 diabetes mellitus with hyperglycemia: Secondary | ICD-10-CM

## 2017-05-30 DIAGNOSIS — E118 Type 2 diabetes mellitus with unspecified complications: Principal | ICD-10-CM

## 2017-05-30 MED ORDER — INSULIN PEN NEEDLE 31G X 8 MM MISC: 1.0000 "application " | Freq: Every day | 5 refills | Status: DC

## 2017-05-30 NOTE — Telephone Encounter (Signed)
Pt called to request a refill for Insulin Pen Needle (B-D ULTRAFINE III SHORT PEN) 31G X 8 MM MISC  Please sent it to [facility] pharmacy  please follow up

## 2017-05-30 NOTE — Telephone Encounter (Signed)
Refilled

## 2017-06-03 MED FILL — TRUEPLUS PEN NDL 31GX5/16": 31G X 8 MM | 30 days supply | Qty: 100 | Fill #0

## 2017-06-03 MED FILL — TRUEPLUS PEN NDL 31GX5/16: 31G X 8 MM | 30 days supply | Qty: 100 | Fill #0

## 2017-06-03 MED FILL — BRIMONIDINE 0.2% EYE DROP: 0.2 | 18 days supply | Qty: 5 | Fill #0

## 2017-07-02 MED FILL — PANTOPRAZOLE SOD DR 20 MG T: 20 | 30 days supply | Qty: 30 | Fill #2

## 2017-07-02 MED FILL — BRIMONIDINE 0.2% EYE DROP: 0.2 | 18 days supply | Qty: 5 | Fill #1

## 2017-07-02 MED FILL — LATANOPROST 0.005% EYE DRP: 0.005 | 30 days supply | Qty: 3 | Fill #1

## 2017-07-02 MED FILL — METFORMIN HCL ER 500 MG TAB: 500 | 30 days supply | Qty: 120 | Fill #1

## 2017-07-02 MED FILL — ROSUVASTATIN CALCIUM 20 MG: 20 | 30 days supply | Qty: 30 | Fill #1

## 2017-07-02 MED FILL — LISINOPRIL 20 MG TAB: 20 | 30 days supply | Qty: 30 | Fill #2

## 2017-07-04 ENCOUNTER — Telehealth: Payer: Self-pay | Admitting: Pharmacist

## 2017-07-04 NOTE — Telephone Encounter (Signed)
Patient is currently here requesting for  Insulin Glargine (LANTUS SOLOSTAR) 100 UNIT/ML Solostar Pen [[id number]] and ACCU-CHEK FASTCLIX LANCETS MISC [[id number]] for her mother. She is completely out. Please send to [facility] on [address]

## 2017-07-07 ENCOUNTER — Other Ambulatory Visit: Payer: Self-pay | Admitting: Family Medicine

## 2017-07-07 DIAGNOSIS — E118 Type 2 diabetes mellitus with unspecified complications: Principal | ICD-10-CM

## 2017-07-07 DIAGNOSIS — IMO0002 Reserved for concepts with insufficient information to code with codable children: Secondary | ICD-10-CM

## 2017-07-07 DIAGNOSIS — E1165 Type 2 diabetes mellitus with hyperglycemia: Secondary | ICD-10-CM

## 2017-07-07 MED ORDER — INSULIN GLARGINE 100 UNIT/ML SOLOSTAR PEN: 24.0000 [IU] | PEN_INJECTOR | Freq: Every day | SUBCUTANEOUS | 0 refills | Status: DC

## 2017-07-07 MED FILL — LANTUS SOLOSTAR 100 UNITS/M: 100 | 25 days supply | Qty: 6 | Fill #0

## 2017-08-11 ENCOUNTER — Other Ambulatory Visit: Payer: Self-pay | Admitting: Family Medicine

## 2017-08-11 DIAGNOSIS — Z760 Encounter for issue of repeat prescription: Secondary | ICD-10-CM

## 2017-08-11 MED ORDER — GLUCOSE BLOOD VI STRP: ORAL_STRIP | 12 refills | Status: DC

## 2017-08-11 MED FILL — LISINOPRIL 20 MG TAB: 20 | 30 days supply | Qty: 30 | Fill #3

## 2017-08-11 MED FILL — METFORMIN HCL ER 500 MG TAB: 500 | 30 days supply | Qty: 120 | Fill #2

## 2017-08-11 MED FILL — BRIMONIDINE 0.2% EYE DROP: 0.2 | 18 days supply | Qty: 5 | Fill #2

## 2017-08-11 MED FILL — TRUEPLUS PEN NDL 31GX5/16": 31G X 8 MM | 30 days supply | Qty: 100 | Fill #1

## 2017-08-11 MED FILL — LATANOPROST 0.005% EYE DRP: 0.005 | 30 days supply | Qty: 3 | Fill #2

## 2017-08-11 MED FILL — LANTUS SOLOSTAR 100 UNITS/M: 100 | 25 days supply | Qty: 6 | Fill #1

## 2017-08-11 MED FILL — TRUEPLUS PEN NDL 31GX5/16: 31G X 8 MM | 30 days supply | Qty: 100 | Fill #1

## 2017-08-11 MED FILL — PANTOPRAZOLE SOD DR 20 MG T: 20 | 30 days supply | Qty: 30 | Fill #3

## 2017-08-11 MED FILL — ROSUVASTATIN CALCIUM 20 MG: 20 | 30 days supply | Qty: 30 | Fill #2

## 2017-09-16 MED FILL — TRUEPLUS PEN NDL 31GX5/16": 31G X 8 MM | 30 days supply | Qty: 100 | Fill #2

## 2017-09-16 MED FILL — TRUEPLUS PEN NDL 31GX5/16: 31G X 8 MM | 30 days supply | Qty: 100 | Fill #2

## 2017-09-16 MED FILL — TIMOLOL 0.5% EYE DROPS: 0.5 | 30 days supply | Qty: 10 | Fill #0

## 2017-09-16 MED FILL — DORZOLAMIDE HCL 2% EYE DRP: 2 | 30 days supply | Qty: 10 | Fill #0

## 2017-09-16 MED FILL — PANTOPRAZOLE SOD DR 20 MG T: 20 | 30 days supply | Qty: 30 | Fill #4

## 2017-09-16 MED FILL — BRIMONIDINE 0.2% EYE DROP: 0.2 | 30 days supply | Qty: 10 | Fill #3

## 2017-09-16 MED FILL — LATANOPROST 0.005% EYE DRP: 0.005 | 30 days supply | Qty: 3 | Fill #3

## 2017-09-16 MED FILL — LANTUS SOLOSTAR 100 UNITS/M: 100 | 12 days supply | Qty: 3 | Fill #2

## 2017-09-16 MED FILL — LISINOPRIL 20 MG TAB: 20 | 30 days supply | Qty: 30 | Fill #4

## 2017-09-19 ENCOUNTER — Telehealth: Payer: Self-pay | Admitting: Internal Medicine

## 2017-09-19 DIAGNOSIS — IMO0002 Reserved for concepts with insufficient information to code with codable children: Secondary | ICD-10-CM

## 2017-09-19 DIAGNOSIS — E1165 Type 2 diabetes mellitus with hyperglycemia: Secondary | ICD-10-CM

## 2017-09-19 DIAGNOSIS — E118 Type 2 diabetes mellitus with unspecified complications: Principal | ICD-10-CM

## 2017-09-19 DIAGNOSIS — E785 Hyperlipidemia, unspecified: Secondary | ICD-10-CM

## 2017-09-19 DIAGNOSIS — E1169 Type 2 diabetes mellitus with other specified complication: Secondary | ICD-10-CM

## 2017-09-19 MED ORDER — ROSUVASTATIN CALCIUM 40 MG PO TABS: 40.0000 mg | ORAL_TABLET | Freq: Every day | ORAL | 0 refills | Status: DC

## 2017-09-19 MED ORDER — METFORMIN HCL ER 500 MG PO TB24: 1000.0000 mg | ORAL_TABLET | Freq: Two times a day (BID) | ORAL | 1 refills | Status: DC

## 2017-09-19 NOTE — Telephone Encounter (Signed)
Refilled to last until appt but must keep appt for further refills.

## 2017-09-19 NOTE — Telephone Encounter (Signed)
Patients daughter stopped by requesting for mothers listed medications to be refilled rosuvastatin (CRESTOR) 40 MG tablet [[id number]] metFORMIN (GLUCOPHAGE-XR) 500 MG 24 hr tablet [[id number]]  Please send to [facility] pharmacy.  Patient made an appointment for May 1st to reestablish care with a MD, patient daughter wanted to know if she could get a refill on current meds until appt.

## 2017-10-02 MED FILL — ROSUVASTATIN CALCIUM 40 MG: 40 | 30 days supply | Qty: 30 | Fill #0

## 2017-10-02 MED FILL — METFORMIN HCL ER 500 MG TAB: 500 | 30 days supply | Qty: 120 | Fill #0

## 2017-10-03 ENCOUNTER — Other Ambulatory Visit: Payer: Self-pay | Admitting: Pharmacist

## 2017-10-03 DIAGNOSIS — E1165 Type 2 diabetes mellitus with hyperglycemia: Secondary | ICD-10-CM

## 2017-10-03 DIAGNOSIS — IMO0002 Reserved for concepts with insufficient information to code with codable children: Secondary | ICD-10-CM

## 2017-10-03 DIAGNOSIS — E118 Type 2 diabetes mellitus with unspecified complications: Principal | ICD-10-CM

## 2017-10-03 MED ORDER — INSULIN GLARGINE 100 UNIT/ML SOLOSTAR PEN: 24.0000 [IU] | PEN_INJECTOR | Freq: Every day | SUBCUTANEOUS | 0 refills | Status: DC

## 2017-10-03 MED FILL — BASAGLAR 100 UNIT/ML KWIKPE: 100 | 25 days supply | Qty: 6 | Fill #0

## 2017-10-23 MED FILL — TIMOLOL 0.5% EYE DROPS: 0.5 | 30 days supply | Qty: 5 | Fill #1

## 2017-10-23 MED FILL — LISINOPRIL 20 MG TAB: 20 | 30 days supply | Qty: 30 | Fill #5

## 2017-10-23 MED FILL — DORZOLAMIDE HCL 2% EYE DRP: 2 | 30 days supply | Qty: 10 | Fill #1

## 2017-10-23 MED FILL — BRIMONIDINE 0.2% EYE DROP: 0.2 | 30 days supply | Qty: 10 | Fill #4

## 2017-10-23 MED FILL — TRUEplus 5-BEVEL PEN NEEDLE: 31G X 8 MM | 30 days supply | Qty: 100 | Fill #3

## 2017-10-23 MED FILL — BASAGLAR 100 UNIT/ML KWIKPE: 100 | 25 days supply | Qty: 6 | Fill #1

## 2017-10-23 MED FILL — LATANOPROST 0.005% EYE DRP: 0.005 | 30 days supply | Qty: 3 | Fill #4

## 2017-10-29 ENCOUNTER — Ambulatory Visit: Payer: Medicare Other | Attending: Family Medicine | Admitting: Family Medicine

## 2017-10-29 ENCOUNTER — Encounter: Payer: Self-pay | Admitting: Family Medicine

## 2017-10-29 VITALS — BP 150/82 | HR 71 | Temp 97.9°F | Ht 63.0 in | Wt 146.6 lb

## 2017-10-29 DIAGNOSIS — Z794 Long term (current) use of insulin: Secondary | ICD-10-CM | POA: Insufficient documentation

## 2017-10-29 DIAGNOSIS — Z79899 Other long term (current) drug therapy: Secondary | ICD-10-CM | POA: Diagnosis not present

## 2017-10-29 DIAGNOSIS — Z9889 Other specified postprocedural states: Secondary | ICD-10-CM | POA: Insufficient documentation

## 2017-10-29 DIAGNOSIS — I10 Essential (primary) hypertension: Secondary | ICD-10-CM | POA: Insufficient documentation

## 2017-10-29 DIAGNOSIS — M1990 Unspecified osteoarthritis, unspecified site: Secondary | ICD-10-CM | POA: Diagnosis not present

## 2017-10-29 DIAGNOSIS — Z9049 Acquired absence of other specified parts of digestive tract: Secondary | ICD-10-CM | POA: Diagnosis not present

## 2017-10-29 DIAGNOSIS — E1169 Type 2 diabetes mellitus with other specified complication: Secondary | ICD-10-CM | POA: Insufficient documentation

## 2017-10-29 DIAGNOSIS — Z905 Acquired absence of kidney: Secondary | ICD-10-CM | POA: Diagnosis not present

## 2017-10-29 DIAGNOSIS — E1165 Type 2 diabetes mellitus with hyperglycemia: Secondary | ICD-10-CM | POA: Diagnosis not present

## 2017-10-29 DIAGNOSIS — E118 Type 2 diabetes mellitus with unspecified complications: Secondary | ICD-10-CM | POA: Diagnosis not present

## 2017-10-29 DIAGNOSIS — E785 Hyperlipidemia, unspecified: Secondary | ICD-10-CM | POA: Diagnosis not present

## 2017-10-29 DIAGNOSIS — IMO0002 Reserved for concepts with insufficient information to code with codable children: Secondary | ICD-10-CM

## 2017-10-29 DIAGNOSIS — K219 Gastro-esophageal reflux disease without esophagitis: Secondary | ICD-10-CM | POA: Diagnosis not present

## 2017-10-29 LAB — GLUCOSE, POCT (MANUAL RESULT ENTRY): POC Glucose: 186 mg/dl — AB (ref 70–99)

## 2017-10-29 LAB — POCT GLYCOSYLATED HEMOGLOBIN (HGB A1C): HEMOGLOBIN A1C: 11

## 2017-10-29 MED ORDER — ATORVASTATIN CALCIUM 40 MG PO TABS: 40.0000 mg | ORAL_TABLET | Freq: Every day | ORAL | 1 refills | Status: DC

## 2017-10-29 MED ORDER — METFORMIN HCL ER 500 MG PO TB24: 1000.0000 mg | ORAL_TABLET | Freq: Two times a day (BID) | ORAL | 1 refills | Status: DC

## 2017-10-29 MED ORDER — DICLOFENAC SODIUM 1 % TD GEL: 2.0000 g | Freq: Four times a day (QID) | TRANSDERMAL | 3 refills | Status: DC

## 2017-10-29 MED ORDER — INSULIN GLARGINE 100 UNIT/ML SOLOSTAR PEN: 24.0000 [IU] | PEN_INJECTOR | Freq: Every day | SUBCUTANEOUS | 6 refills | Status: DC

## 2017-10-29 MED ORDER — PANTOPRAZOLE SODIUM 20 MG PO TBEC: 20.0000 mg | DELAYED_RELEASE_TABLET | Freq: Every day | ORAL | 1 refills | Status: DC

## 2017-10-29 MED ORDER — LISINOPRIL 20 MG PO TABS: 20.0000 mg | ORAL_TABLET | Freq: Every day | ORAL | 1 refills | Status: DC

## 2017-10-29 MED FILL — PANTOPRAZOLE SOD DR 20 MG T: 20 | 30 days supply | Qty: 30 | Fill #5

## 2017-10-29 MED FILL — METFORMIN HCL ER 500 MG TAB: 500 | 30 days supply | Qty: 120 | Fill #1

## 2017-10-29 NOTE — Progress Notes (Signed)
Subjective:  Patient ID: Tina Phelps, female    DOB: 1946-01-10  Age: 72 y.o. MRN: [id number]  CC: Diabetes   HPI Tina Phelps is a 72 year old female with a history of type 2 diabetes mellitus (A1c 11.0), hypertension, hyperlipidemia, osteoarthritis, GERD who presents today accompanied by her daughter for a follow-up visit. She has been compliant with her medications and reports her fasting sugars have been in the 180 range.  She denies hypoglycemia, numbness in extremities, visual concerns.  She will be commencing the Ramadan fast in 3 days and is wondering if she would need to continue with her medications. She tolerates her antihypertensive with no complaints of adverse effects but has been out of Crestor due to this not being covered by her insurance company. Reflux symptoms have sometimes exacerbated by fatty foods with a sour taste in her mouth but she has been compliant with her PPI. Her osteoarthritis is stable on her NSAID.  Past Medical History:  Diagnosis Date  . Arthritis   . Diabetes mellitus without complication ([place])   . Glaucoma   . Hypertension     Past Surgical History:  Procedure Laterality Date  . CHOLECYSTECTOMY    . NEPHRECTOMY Right   . OOPHORECTOMY Right      Outpatient Medications Prior to Visit  Medication Sig Dispense Refill  . ACCU-CHEK FASTCLIX LANCETS MISC Use as directed 100 each 3  . Blood Glucose Monitoring Suppl (ACCU-CHEK AVIVA PLUS) w/Device KIT Use as directed 1 kit 0  . brimonidine (ALPHAGAN) 0.2 % ophthalmic solution Place 1 drop into both eyes 3 (three) times daily.    . dorzolamide-timolol (COSOPT) 22.3-6.8 MG/ML ophthalmic solution Place 1 drop into both eyes 2 (two) times daily. 10 mL 3  . glucose blood (ACCU-CHEK AVIVA PLUS) test strip Use to check blood sugars twice a day. 100 each 12  . Insulin Pen Needle (B-D ULTRAFINE III SHORT PEN) 31G X 8 MM MISC 1 application by Does not apply route daily. 30 each 5  . latanoprost (XALATAN)  0.005 % ophthalmic solution 1 drop at bedtime.    . timolol (BETIMOL) 0.5 % ophthalmic solution Place 1 drop into both eyes at bedtime. 10 mL 3  . diclofenac sodium (VOLTAREN) 1 % GEL Apply 2 g topically 4 (four) times daily. 300 g 3  . Insulin Glargine (LANTUS SOLOSTAR) 100 UNIT/ML Solostar Pen Inject 24 Units into the skin daily at 10 pm. 15 mL 0  . lisinopril (PRINIVIL,ZESTRIL) 20 MG tablet Take 1 tablet (20 mg total) by mouth daily. 90 tablet 3  . metFORMIN (GLUCOPHAGE-XR) 500 MG 24 hr tablet Take 2 tablets (1,000 mg total) by mouth 2 (two) times daily with a meal. 120 tablet 1  . pantoprazole (PROTONIX) 20 MG tablet Take 1 tablet (20 mg total) by mouth daily. 90 tablet 3  . rosuvastatin (CRESTOR) 40 MG tablet Take 1 tablet (40 mg total) by mouth daily. (Patient not taking: Reported on 10/29/2017) 90 tablet 0   No facility-administered medications prior to visit.     ROS Review of Systems  Constitutional: Negative for activity change, appetite change and fatigue.  HENT: Negative for congestion, sinus pressure and sore throat.   Eyes: Negative for visual disturbance.  Respiratory: Negative for cough, chest tightness, shortness of breath and wheezing.   Cardiovascular: Negative for chest pain and palpitations.  Gastrointestinal: Negative for abdominal distention, abdominal pain and constipation.  Endocrine: Negative for polydipsia.  Genitourinary: Negative for dysuria and frequency.  Musculoskeletal: Negative for  arthralgias and back pain.  Skin: Negative for rash.  Neurological: Negative for tremors, light-headedness and numbness.  Hematological: Does not bruise/bleed easily.  Psychiatric/Behavioral: Negative for agitation and behavioral problems.    Objective:  BP (!) 150/82   Pulse 71   Temp 97.9 F (36.6 C) (Oral)   Ht _0  (1.6 m)   Wt 146 lb 9.6 oz (66.5 kg)   SpO2 98%   BMI 25.97 kg/m   BP/Weight 10/29/2017 02/19/2017 3/38/2505  Systolic BP 397 673 419  Diastolic BP 82  72 60  Wt. (Lbs) 146.6 140.6 144  BMI 25.97 24.91 25.51      Physical Exam  Constitutional: She is oriented to person, place, and time. She appears well-developed and well-nourished.  Cardiovascular: Normal rate, normal heart sounds and intact distal pulses.  No murmur heard. Pulmonary/Chest: Effort normal and breath sounds normal. She has no wheezes. She has no rales. She exhibits no tenderness.  Abdominal: Soft. Bowel sounds are normal. She exhibits no distension and no mass. There is no tenderness.  Musculoskeletal: Normal range of motion.  Neurological: She is alert and oriented to person, place, and time.  Skin: Skin is warm and dry.  Psychiatric: She has a normal mood and affect.      CMP Latest Ref Rng & Units 11/15/2016 09/30/2014 06/08/2014  Glucose 65 - 99 mg/dL 182(H) 260(H) 126(H)  BUN 8 - 27 mg/dL _1 Creatinine 0.57 - 1.00 mg/dL 0.99 0.81 0.76  Sodium 134 - 144 mmol/L 137 138 138  Potassium 3.5 - 5.2 mmol/L 4.0 4.1 4.3  Chloride 96 - 106 mmol/L 99 102 104  CO2 18 - 29 mmol/L _2 Calcium 8.7 - 10.3 mg/dL 9.8 9.3 9.9  Total Protein 6.0 - 8.5 g/dL 6.8 6.8 6.9  Total Bilirubin 0.0 - 1.2 mg/dL 0.4 0.4 0.5  Alkaline Phos 39 - 117 IU/L 106 103 79  AST 0 - 40 IU/L _3 ALT 0 - 32 IU/L _4 Lipid Panel     Component Value Date/Time   CHOL 196 02/19/2017 1603   TRIG 168 (H) 02/19/2017 1603   HDL 53 02/19/2017 1603   CHOLHDL 3.7 02/19/2017 1603   CHOLHDL 3.2 12/15/2013 1011   VLDL 26 12/15/2013 1011   LDLCALC 109 (H) 02/19/2017 1603    Lab Results  Component Value Date   HGBA1C 11.0 10/29/2017    Assessment & Plan:   1. Uncontrolled type 2 diabetes mellitus with complication, without long-term current use of insulin ([place]) Uncontrolled with A1c of 11.0 She will be commencing her month of Ramadan fast in 3 days and so I will hold off on making regimen changes to prevent hypoglycemia but she has been advised to continue current regimen and  check sugars during the day. If daytime sugars are low she will reduce her Lantus dose by 2 units I will see her back in 6 weeks by which time her fasting have been completed to reevaluate her blood sugars and adjust her regimen further - POCT glucose (manual entry) - POCT glycosylated hemoglobin (Hb A1C) - CMP14+EGFR - Lipid panel - Microalbumin/Creatinine Ratio, Urine - Insulin Glargine (LANTUS SOLOSTAR) 100 UNIT/ML Solostar Pen; Inject 24 Units into the skin daily at 10 pm.  Dispense: 15 mL; Refill: 6 - lisinopril (PRINIVIL,ZESTRIL) 20 MG tablet; Take 1 tablet (20 mg total) by mouth daily.  Dispense: 90 tablet; Refill: 1 - metFORMIN (GLUCOPHAGE-XR) 500 MG 24 hr  tablet; Take 2 tablets (1,000 mg total) by mouth 2 (two) times daily with a meal.  Dispense: 360 tablet; Refill: 1  2. Essential hypertension, benign Uncontrolled, no regimen changed today Low-sodium diet - lisinopril (PRINIVIL,ZESTRIL) 20 MG tablet; Take 1 tablet (20 mg total) by mouth daily.  Dispense: 90 tablet; Refill: 1  3. Gastroesophageal reflux disease without esophagitis Uncontrolled Advised to limit intake of spicy foods, avoid recumbency up to 2 hours post meals - pantoprazole (PROTONIX) 20 MG tablet; Take 1 tablet (20 mg total) by mouth daily.  Dispense: 90 tablet; Refill: 1  4. Arthritis Controlled - diclofenac sodium (VOLTAREN) 1 % GEL; Apply 2 g topically 4 (four) times daily.  Dispense: 300 g; Refill: 3  5. Hyperlipidemia associated with type 2 diabetes mellitus ([place]) We will make no changes if regimen is uncontrolled and she has been out of Crestor due to lack of coverage by insurance company Switched to atorvastatin which is more likely be covered - atorvastatin (LIPITOR) 40 MG tablet; Take 1 tablet (40 mg total) by mouth daily.  Dispense: 90 tablet; Refill: 1   Meds ordered this encounter  Medications  . Insulin Glargine (LANTUS SOLOSTAR) 100 UNIT/ML Solostar Pen    Sig: Inject 24 Units into the skin  daily at 10 pm.    Dispense:  15 mL    Refill:  6  . lisinopril (PRINIVIL,ZESTRIL) 20 MG tablet    Sig: Take 1 tablet (20 mg total) by mouth daily.    Dispense:  90 tablet    Refill:  1  . metFORMIN (GLUCOPHAGE-XR) 500 MG 24 hr tablet    Sig: Take 2 tablets (1,000 mg total) by mouth 2 (two) times daily with a meal.    Dispense:  360 tablet    Refill:  1  . pantoprazole (PROTONIX) 20 MG tablet    Sig: Take 1 tablet (20 mg total) by mouth daily.    Dispense:  90 tablet    Refill:  1  . diclofenac sodium (VOLTAREN) 1 % GEL    Sig: Apply 2 g topically 4 (four) times daily.    Dispense:  300 g    Refill:  3  . atorvastatin (LIPITOR) 40 MG tablet    Sig: Take 1 tablet (40 mg total) by mouth daily.    Dispense:  90 tablet    Refill:  1    Follow-up: Return in about 6 weeks (around 12/10/2017) for follow up of Diabetes mellitus.   Charlott Rakes MD

## 2017-10-30 LAB — CMP14+EGFR
ALT: 12 IU/L (ref 0–32)
AST: 18 IU/L (ref 0–40)
Albumin/Globulin Ratio: 1.6 (ref 1.2–2.2)
Albumin: 4.2 g/dL (ref 3.6–4.8)
Alkaline Phosphatase: 127 IU/L — ABNORMAL HIGH (ref 39–117)
BUN/Creatinine Ratio: 16 (ref 12–28)
BUN: 15 mg/dL (ref 8–27)
Bilirubin Total: 0.3 mg/dL (ref 0.0–1.2)
CALCIUM: 9.7 mg/dL (ref 8.7–10.3)
CO2: 25 mmol/L (ref 20–29)
Chloride: 102 mmol/L (ref 96–106)
Creatinine, Ser: 0.94 mg/dL (ref 0.57–1.00)
GFR, EST AFRICAN AMERICAN: 72 mL/min/{1.73_m2} (ref >59)
GFR, EST NON AFRICAN AMERICAN: 62 mL/min/{1.73_m2} (ref >59)
GLUCOSE: 203 mg/dL — AB (ref 65–99)
Globulin, Total: 2.6 g/dL (ref 1.5–4.5)
Potassium: 4.3 mmol/L (ref 3.5–5.2)
Sodium: 140 mmol/L (ref 134–144)
TOTAL PROTEIN: 6.8 g/dL (ref 6.0–8.5)

## 2017-10-30 LAB — LIPID PANEL
CHOL/HDL RATIO: 2.6 ratio (ref 0.0–4.4)
Cholesterol, Total: 171 mg/dL (ref 100–199)
HDL: 67 mg/dL (ref >39)
LDL CALC: 81 mg/dL (ref 0–99)
Triglycerides: 113 mg/dL (ref 0–149)
VLDL CHOLESTEROL CAL: 23 mg/dL (ref 5–40)

## 2017-11-06 ENCOUNTER — Telehealth: Payer: Self-pay

## 2017-11-06 NOTE — Telephone Encounter (Signed)
-----   Message from Charlott Rakes, MD sent at 10/30/2017  1:17 PM EDT ----- Cholesterol is normal and so are her kidney and liver functions.  Her blood glucose is elevated but as we discussed at her last visit I will make no changes at this time due to the upcoming Ramadan fast and will see her at her next visit.

## 2017-11-06 NOTE — Telephone Encounter (Signed)
Patient was called and informed of lab results. 

## 2017-11-12 MED FILL — ATORVASTATIN CALCIUM 40 MG: 40 | 90 days supply | Qty: 90 | Fill #0

## 2017-11-26 MED FILL — DORZOLAMIDE HCL 2% EYE DRP: 2 | 30 days supply | Qty: 10 | Fill #2

## 2017-11-26 MED FILL — BASAGLAR 100 UNIT/ML KWIKPE: 100 | 12 days supply | Qty: 3 | Fill #2

## 2017-11-26 MED FILL — LISINOPRIL 20 MG TAB: 20 | 30 days supply | Qty: 30 | Fill #6

## 2017-11-26 MED FILL — PANTOPRAZOLE SOD DR 20 MG T: 20 | 30 days supply | Qty: 30 | Fill #6

## 2017-11-26 MED FILL — BRIMONIDINE 0.2% EYE DROP: 0.2 | 30 days supply | Qty: 10 | Fill #5

## 2017-11-26 MED FILL — LATANOPROST 0.005% EYE DRP: 0.005 | 30 days supply | Qty: 3 | Fill #5

## 2017-11-26 MED FILL — TIMOLOL 0.5% EYE DROPS: 0.5 | 30 days supply | Qty: 5 | Fill #2

## 2017-12-31 ENCOUNTER — Other Ambulatory Visit: Payer: Self-pay | Admitting: Family Medicine

## 2017-12-31 DIAGNOSIS — E118 Type 2 diabetes mellitus with unspecified complications: Principal | ICD-10-CM

## 2017-12-31 DIAGNOSIS — IMO0002 Reserved for concepts with insufficient information to code with codable children: Secondary | ICD-10-CM

## 2017-12-31 DIAGNOSIS — E1165 Type 2 diabetes mellitus with hyperglycemia: Secondary | ICD-10-CM

## 2017-12-31 MED FILL — TIMOLOL 0.5% EYE DROPS: 0.5 | 30 days supply | Qty: 5 | Fill #3

## 2017-12-31 MED FILL — BRIMONIDINE 0.2% EYE DROP: 0.2 | 30 days supply | Qty: 10 | Fill #6

## 2017-12-31 MED FILL — LATANOPROST 0.005% EYE DRP: 0.005 | 30 days supply | Qty: 3 | Fill #6

## 2017-12-31 MED FILL — DORZOLAMIDE HCL 2% EYE DRP: 2 | 30 days supply | Qty: 10 | Fill #3

## 2017-12-31 MED FILL — LISINOPRIL 20 MG TAB: 20 | 90 days supply | Qty: 90 | Fill #7

## 2017-12-31 MED FILL — PANTOPRAZOLE SOD DR 20 MG T: 20 | 90 days supply | Qty: 90 | Fill #7

## 2018-01-02 ENCOUNTER — Other Ambulatory Visit: Payer: Self-pay | Admitting: Pharmacist

## 2018-01-02 DIAGNOSIS — E118 Type 2 diabetes mellitus with unspecified complications: Principal | ICD-10-CM

## 2018-01-02 DIAGNOSIS — IMO0002 Reserved for concepts with insufficient information to code with codable children: Secondary | ICD-10-CM

## 2018-01-02 DIAGNOSIS — E1165 Type 2 diabetes mellitus with hyperglycemia: Secondary | ICD-10-CM

## 2018-01-02 MED ORDER — BASAGLAR KWIKPEN 100 UNIT/ML ~~LOC~~ SOPN: 24.0000 [IU] | PEN_INJECTOR | Freq: Every day | SUBCUTANEOUS | 0 refills | Status: DC

## 2018-01-02 MED FILL — BASAGLAR 100 UNIT/ML KWIKPE: 100 | 25 days supply | Qty: 6 | Fill #0

## 2018-01-05 ENCOUNTER — Other Ambulatory Visit: Payer: Self-pay | Admitting: Internal Medicine

## 2018-01-05 DIAGNOSIS — E1165 Type 2 diabetes mellitus with hyperglycemia: Secondary | ICD-10-CM

## 2018-01-05 DIAGNOSIS — IMO0002 Reserved for concepts with insufficient information to code with codable children: Secondary | ICD-10-CM

## 2018-01-05 DIAGNOSIS — E118 Type 2 diabetes mellitus with unspecified complications: Principal | ICD-10-CM

## 2018-01-05 MED FILL — TRUEplus 5-BEVEL PEN NEEDLE: 31G X 8 MM | 30 days supply | Qty: 100 | Fill #4

## 2018-01-05 NOTE — Telephone Encounter (Signed)
Patient called requesting a refill on Metformin. Patient uses [facility] pharmacy. Please f/u

## 2018-01-06 MED FILL — METFORMIN HCL ER 500 MG TAB: 500 | 30 days supply | Qty: 120 | Fill #0

## 2018-01-06 NOTE — Telephone Encounter (Signed)
This was refilled by dr Margarita Rana and sent to [facility] @ [address], I will advise th pharmacy to transfer the rx to [facility] pharmacy and fill asap

## 2018-02-09 MED FILL — BASAGLAR 100 UNIT/ML KWIKPE: 100 | 37 days supply | Qty: 9 | Fill #1

## 2018-02-09 MED FILL — TIMOLOL 0.5% EYE DROPS: 0.5 | 30 days supply | Qty: 5 | Fill #4

## 2018-02-09 MED FILL — ATORVASTATIN CALCIUM 40 MG: 40 | 90 days supply | Qty: 90 | Fill #1

## 2018-02-09 MED FILL — LATANOPROST 0.005% EYE DRP: 0.005 | 30 days supply | Qty: 3 | Fill #7

## 2018-02-09 MED FILL — METFORMIN HCL ER 500 MG TAB: 500 | 90 days supply | Qty: 360 | Fill #1

## 2018-02-09 MED FILL — BRIMONIDINE TARTRATE 0.2 %: 0.2 | 21 days supply | Qty: 5 | Fill #7

## 2018-02-10 MED FILL — DORZOLAMIDE HCL 2% EYE DRP: 2 | 30 days supply | Qty: 10 | Fill #0

## 2018-02-11 ENCOUNTER — Other Ambulatory Visit: Payer: Self-pay

## 2018-04-14 ENCOUNTER — Ambulatory Visit: Payer: Medicare Other | Attending: Family Medicine | Admitting: Family Medicine

## 2018-04-14 ENCOUNTER — Encounter: Payer: Self-pay | Admitting: Family Medicine

## 2018-04-14 VITALS — BP 140/80 | HR 82 | Temp 98.3°F | Ht 63.0 in | Wt 145.6 lb

## 2018-04-14 DIAGNOSIS — E1165 Type 2 diabetes mellitus with hyperglycemia: Secondary | ICD-10-CM | POA: Diagnosis not present

## 2018-04-14 DIAGNOSIS — Z794 Long term (current) use of insulin: Secondary | ICD-10-CM | POA: Diagnosis not present

## 2018-04-14 DIAGNOSIS — H42 Glaucoma in diseases classified elsewhere: Secondary | ICD-10-CM | POA: Diagnosis not present

## 2018-04-14 DIAGNOSIS — I10 Essential (primary) hypertension: Secondary | ICD-10-CM | POA: Insufficient documentation

## 2018-04-14 DIAGNOSIS — M19041 Primary osteoarthritis, right hand: Secondary | ICD-10-CM | POA: Diagnosis not present

## 2018-04-14 DIAGNOSIS — Z905 Acquired absence of kidney: Secondary | ICD-10-CM | POA: Diagnosis not present

## 2018-04-14 DIAGNOSIS — K219 Gastro-esophageal reflux disease without esophagitis: Secondary | ICD-10-CM | POA: Diagnosis not present

## 2018-04-14 DIAGNOSIS — E785 Hyperlipidemia, unspecified: Secondary | ICD-10-CM

## 2018-04-14 DIAGNOSIS — M1990 Unspecified osteoarthritis, unspecified site: Secondary | ICD-10-CM | POA: Diagnosis not present

## 2018-04-14 DIAGNOSIS — Z2821 Immunization not carried out because of patient refusal: Secondary | ICD-10-CM | POA: Diagnosis not present

## 2018-04-14 DIAGNOSIS — M19042 Primary osteoarthritis, left hand: Secondary | ICD-10-CM | POA: Diagnosis not present

## 2018-04-14 DIAGNOSIS — E118 Type 2 diabetes mellitus with unspecified complications: Secondary | ICD-10-CM

## 2018-04-14 DIAGNOSIS — M170 Bilateral primary osteoarthritis of knee: Secondary | ICD-10-CM | POA: Insufficient documentation

## 2018-04-14 DIAGNOSIS — E1169 Type 2 diabetes mellitus with other specified complication: Secondary | ICD-10-CM

## 2018-04-14 DIAGNOSIS — E1139 Type 2 diabetes mellitus with other diabetic ophthalmic complication: Secondary | ICD-10-CM | POA: Diagnosis not present

## 2018-04-14 DIAGNOSIS — Z79899 Other long term (current) drug therapy: Secondary | ICD-10-CM | POA: Insufficient documentation

## 2018-04-14 DIAGNOSIS — IMO0002 Reserved for concepts with insufficient information to code with codable children: Secondary | ICD-10-CM

## 2018-04-14 DIAGNOSIS — Z9049 Acquired absence of other specified parts of digestive tract: Secondary | ICD-10-CM | POA: Insufficient documentation

## 2018-04-14 LAB — GLUCOSE, POCT (MANUAL RESULT ENTRY): POC GLUCOSE: 244 mg/dL — AB (ref 70–99)

## 2018-04-14 LAB — POCT GLYCOSYLATED HEMOGLOBIN (HGB A1C): HEMOGLOBIN A1C: 10.6 % — AB (ref 4.0–5.6)

## 2018-04-14 MED ORDER — PANTOPRAZOLE SODIUM 20 MG PO TBEC: 20.0000 mg | DELAYED_RELEASE_TABLET | Freq: Every day | ORAL | 1 refills | Status: DC

## 2018-04-14 MED ORDER — METFORMIN HCL ER 500 MG PO TB24: 1000.0000 mg | ORAL_TABLET | Freq: Two times a day (BID) | ORAL | 1 refills | Status: DC

## 2018-04-14 MED ORDER — BASAGLAR KWIKPEN 100 UNIT/ML ~~LOC~~ SOPN: 30.0000 [IU] | PEN_INJECTOR | Freq: Every day | SUBCUTANEOUS | 1 refills | Status: DC

## 2018-04-14 MED ORDER — DICLOFENAC SODIUM 1 % TD GEL: 2.0000 g | Freq: Four times a day (QID) | TRANSDERMAL | 3 refills | Status: DC

## 2018-04-14 MED ORDER — ATORVASTATIN CALCIUM 40 MG PO TABS: 40.0000 mg | ORAL_TABLET | Freq: Every day | ORAL | 1 refills | Status: DC

## 2018-04-14 MED ORDER — LISINOPRIL 20 MG PO TABS: 20.0000 mg | ORAL_TABLET | Freq: Every day | ORAL | 1 refills | Status: DC

## 2018-04-14 MED FILL — TRUEplus 5-BEVEL PEN NEEDLE: 31G X 8 MM | 30 days supply | Qty: 100 | Fill #5

## 2018-04-14 MED FILL — LATANOPROST 0.005% EYE DRP: 0.005 | 75 days supply | Qty: 5 | Fill #8

## 2018-04-14 MED FILL — DORZOLAMIDE HCL 2% EYE DRP: 2 | 75 days supply | Qty: 20 | Fill #1

## 2018-04-14 MED FILL — LISINOPRIL 20 MG TAB: 20 | 90 days supply | Qty: 90 | Fill #0

## 2018-04-14 MED FILL — ATORVASTATIN 40 MG TABLET: 40 | 90 days supply | Qty: 90 | Fill #0

## 2018-04-14 MED FILL — metFORMIN HCL ER 500 MG TB2: 500 | 90 days supply | Qty: 360 | Fill #0

## 2018-04-14 MED FILL — TIMOLOL 0.5% EYE DROPS: 0.5 | 37 days supply | Qty: 10 | Fill #5

## 2018-04-14 MED FILL — PANTOPRAZOLE SOD DR 20 MG T: 20 | 90 days supply | Qty: 90 | Fill #0

## 2018-04-14 MED FILL — BASAGLAR 100 UNIT/ML KWIKPE: 100 | 90 days supply | Qty: 30 | Fill #0

## 2018-04-14 NOTE — Progress Notes (Signed)
Patient is travelling to [place] and will be gone until February.

## 2018-04-14 NOTE — Progress Notes (Signed)
Subjective:  Patient ID: [name], female    DOB: 03/14/1946  Age: 72 y.o. MRN: [id number]  CC: Diabetes   HPI Tina Phelps  is a 72 year old female with a history of type 2 diabetes mellitus (A1c 10.6), hypertension, hyperlipidemia, osteoarthritis, GERD who presents today accompanied by her daughter for a follow-up visit. Since commencing Voltaren gel for her osteoarthritis she has noticed improvement in the pain in her joints of her hands and knees. Her diabetes has been uncontrolled as her sugars remain elevated despite compliance with her insulin but she does not have a blood sugar log with her.  She denies neuropathy, visual concerns, hypoglycemia. She tolerates her antihypertensive and her statin and denies myalgias or other adverse effects. Reflux symptoms are controlled. She will be traveling to [place] later this week and will need medications to last her until February next year.  Past Medical History:  Diagnosis Date  . Arthritis   . Diabetes mellitus without complication ([place])   . Glaucoma   . Hypertension     Past Surgical History:  Procedure Laterality Date  . CHOLECYSTECTOMY    . NEPHRECTOMY Right   . OOPHORECTOMY Right     No Known Allergies   Outpatient Medications Prior to Visit  Medication Sig Dispense Refill  . ACCU-CHEK FASTCLIX LANCETS MISC Use as directed 100 each 3  . Blood Glucose Monitoring Suppl (ACCU-CHEK AVIVA PLUS) w/Device KIT Use as directed 1 kit 0  . brimonidine (ALPHAGAN) 0.2 % ophthalmic solution Place 1 drop into both eyes 3 (three) times daily.    . dorzolamide-timolol (COSOPT) 22.3-6.8 MG/ML ophthalmic solution Place 1 drop into both eyes 2 (two) times daily. 10 mL 3  . glucose blood (ACCU-CHEK AVIVA PLUS) test strip Use to check blood sugars twice a day. 100 each 12  . Insulin Pen Needle (B-D ULTRAFINE III SHORT PEN) 31G X 8 MM MISC 1 application by Does not apply route daily. 30 each 5  . latanoprost (XALATAN) 0.005 % ophthalmic  solution 1 drop at bedtime.    . timolol (BETIMOL) 0.5 % ophthalmic solution Place 1 drop into both eyes at bedtime. 10 mL 3  . atorvastatin (LIPITOR) 40 MG tablet Take 1 tablet (40 mg total) by mouth daily. 90 tablet 1  . diclofenac sodium (VOLTAREN) 1 % GEL Apply 2 g topically 4 (four) times daily. 300 g 3  . Insulin Glargine (BASAGLAR KWIKPEN) 100 UNIT/ML SOPN Inject 0.24 mLs (24 Units total) into the skin daily. 15 mL 0  . lisinopril (PRINIVIL,ZESTRIL) 20 MG tablet Take 1 tablet (20 mg total) by mouth daily. 90 tablet 1  . metFORMIN (GLUCOPHAGE-XR) 500 MG 24 hr tablet Take 2 tablets (1,000 mg total) by mouth 2 (two) times daily with a meal. 360 tablet 1  . pantoprazole (PROTONIX) 20 MG tablet Take 1 tablet (20 mg total) by mouth daily. 90 tablet 1   No facility-administered medications prior to visit.     ROS Review of Systems  Constitutional: Negative for activity change, appetite change and fatigue.  HENT: Negative for congestion, sinus pressure and sore throat.   Eyes: Negative for visual disturbance.  Respiratory: Negative for cough, chest tightness, shortness of breath and wheezing.   Cardiovascular: Negative for chest pain and palpitations.  Gastrointestinal: Negative for abdominal distention, abdominal pain and constipation.  Endocrine: Negative for polydipsia.  Genitourinary: Negative for dysuria and frequency.  Musculoskeletal: Negative for arthralgias and back pain.  Skin: Negative for rash.  Neurological: Negative for tremors, light-headedness  and numbness.  Hematological: Does not bruise/bleed easily.  Psychiatric/Behavioral: Negative for agitation and behavioral problems.    Objective:  BP (!) 178/95   Pulse 82   Temp 98.3 F (36.8 C) (Oral)   Ht '5\' 3"'$  (1.6 m)   Wt 145 lb 9.6 oz (66 kg)   SpO2 100%   BMI 25.79 kg/m   BP/Weight 04/14/2018 10/29/2017 3/90/3009  Systolic BP 233 007 622  Diastolic BP 95 82 72  Wt. (Lbs) 145.6 146.6 140.6  BMI 25.79 25.97 24.91        Physical Exam  Constitutional: She is oriented to person, place, and time. She appears well-developed and well-nourished.  Neck: No JVD present.  Cardiovascular: Normal rate, normal heart sounds and intact distal pulses.  No murmur heard. Pulmonary/Chest: Effort normal and breath sounds normal. She has no wheezes. She has no rales. She exhibits no tenderness.  Abdominal: Soft. Bowel sounds are normal. She exhibits no distension and no mass. There is no tenderness.  Musculoskeletal: Normal range of motion.  Neurological: She is alert and oriented to person, place, and time.  Skin: Skin is warm and dry.  Psychiatric: She has a normal mood and affect.     CMP Latest Ref Rng & Units 10/29/2017 11/15/2016 09/30/2014  Glucose 65 - 99 mg/dL 203(H) 182(H) 260(H)  BUN 8 - 27 mg/dL '15 18 13  '$ Creatinine 0.57 - 1.00 mg/dL 0.94 0.99 0.81  Sodium 134 - 144 mmol/L 140 137 138  Potassium 3.5 - 5.2 mmol/L 4.3 4.0 4.1  Chloride 96 - 106 mmol/L 102 99 102  CO2 20 - 29 mmol/L '25 23 26  '$ Calcium 8.7 - 10.3 mg/dL 9.7 9.8 9.3  Total Protein 6.0 - 8.5 g/dL 6.8 6.8 6.8  Total Bilirubin 0.0 - 1.2 mg/dL 0.3 0.4 0.4  Alkaline Phos 39 - 117 IU/L 127(H) 106 103  AST 0 - 40 IU/L '18 15 16  '$ ALT 0 - 32 IU/L '12 11 13    '$ Lipid Panel     Component Value Date/Time   CHOL 171 10/29/2017 1207   TRIG 113 10/29/2017 1207   HDL 67 10/29/2017 1207   CHOLHDL 2.6 10/29/2017 1207   CHOLHDL 3.2 12/15/2013 1011   VLDL 26 12/15/2013 1011   LDLCALC 81 10/29/2017 1207    Lab Results  Component Value Date   HGBA1C 10.6 (A) 04/14/2018    Assessment & Plan:   1. Diabetes mellitus type 2, uncontrolled, with complications ([place]) Uncontrolled with A1c of 10.6 Increased dose of Lantus Encouraged to adhere to a diabetic diet, lifestyle modifications - POCT glucose (manual entry) - POCT glycosylated hemoglobin (Hb A1C) - Insulin Glargine (BASAGLAR KWIKPEN) 100 UNIT/ML SOPN; Inject 0.3 mLs (30 Units total) into the skin  daily.  Dispense: 30 mL; Refill: 1  2. Hyperlipidemia associated with type 2 diabetes mellitus ([facility]) Controlled Low-cholesterol diet - atorvastatin (LIPITOR) 40 MG tablet; Take 1 tablet (40 mg total) by mouth daily.  Dispense: 90 tablet; Refill: 1  3. Essential hypertension, benign Controlled Counseled on blood pressure goal of less than 130/80, low-sodium, DASH diet, medication compliance, 150 minutes of moderate intensity exercise per week. Discussed medication compliance, adverse effects. - lisinopril (PRINIVIL,ZESTRIL) 20 MG tablet; Take 1 tablet (20 mg total) by mouth daily.  Dispense: 90 tablet; Refill: 1  4. Uncontrolled type 2 diabetes mellitus with complication, without long-term current use of insulin ([facility]) - lisinopril (PRINIVIL,ZESTRIL) 20 MG tablet; Take 1 tablet (20 mg total) by mouth daily.  Dispense: 90  tablet; Refill: 1 - metFORMIN (GLUCOPHAGE-XR) 500 MG 24 hr tablet; Take 2 tablets (1,000 mg total) by mouth 2 (two) times daily with a meal.  Dispense: 360 tablet; Refill: 1  5. Gastroesophageal reflux disease without esophagitis Stable - pantoprazole (PROTONIX) 20 MG tablet; Take 1 tablet (20 mg total) by mouth daily.  Dispense: 90 tablet; Refill: 1  6. Arthritis Controlled on diclofenac - diclofenac sodium (VOLTAREN) 1 % GEL; Apply 2 g topically 4 (four) times daily.  Dispense: 300 g; Refill: 3  HCM -declines pneumonia vaccine  Meds ordered this encounter  Medications  . atorvastatin (LIPITOR) 40 MG tablet    Sig: Take 1 tablet (40 mg total) by mouth daily.    Dispense:  90 tablet    Refill:  1  . Insulin Glargine (BASAGLAR KWIKPEN) 100 UNIT/ML SOPN    Sig: Inject 0.3 mLs (30 Units total) into the skin daily.    Dispense:  30 mL    Refill:  1    90 day supply; discontinue previous dose  . lisinopril (PRINIVIL,ZESTRIL) 20 MG tablet    Sig: Take 1 tablet (20 mg total) by mouth daily.    Dispense:  90 tablet    Refill:  1  . metFORMIN (GLUCOPHAGE-XR) 500 MG 24  hr tablet    Sig: Take 2 tablets (1,000 mg total) by mouth 2 (two) times daily with a meal.    Dispense:  360 tablet    Refill:  1  . pantoprazole (PROTONIX) 20 MG tablet    Sig: Take 1 tablet (20 mg total) by mouth daily.    Dispense:  90 tablet    Refill:  1  . diclofenac sodium (VOLTAREN) 1 % GEL    Sig: Apply 2 g topically 4 (four) times daily.    Dispense:  300 g    Refill:  3    Follow-up: No follow-ups on file.   Charlott Rakes MD

## 2018-04-16 MED FILL — BRIMONIDINE 0.2% EYE DROP: 0.2 | 60 days supply | Qty: 15 | Fill #0

## 2018-04-16 MED FILL — DICLOFENAC SODIUM 1% GEL: 1 | 36 days supply | Qty: 300 | Fill #0

## 2018-04-17 ENCOUNTER — Telehealth: Payer: Self-pay | Admitting: Family Medicine

## 2018-04-17 DIAGNOSIS — E118 Type 2 diabetes mellitus with unspecified complications: Principal | ICD-10-CM

## 2018-04-17 DIAGNOSIS — Z760 Encounter for issue of repeat prescription: Secondary | ICD-10-CM

## 2018-04-17 DIAGNOSIS — E1165 Type 2 diabetes mellitus with hyperglycemia: Secondary | ICD-10-CM

## 2018-04-17 DIAGNOSIS — IMO0002 Reserved for concepts with insufficient information to code with codable children: Secondary | ICD-10-CM

## 2018-04-17 MED ORDER — GLUCOSE BLOOD VI STRP: ORAL_STRIP | 12 refills | Status: DC

## 2018-04-17 NOTE — Telephone Encounter (Signed)
Patient daughter came in and needs a refill for patients test strips. [place] and they need a new Rx with the diagnosis code. Patient will be leaving the country tomorrow. Please f/u   [facility] on [address]

## 2018-10-07 MED FILL — ATORVASTATIN 40 MG TABLET: 40 | 30 days supply | Qty: 30 | Fill #1

## 2018-10-07 MED FILL — METFORMIN HCL ER 500 MG TAB: 500 | 60 days supply | Qty: 240 | Fill #2

## 2018-10-07 MED FILL — PANTOPRAZOLE SOD DR 20 MG T: 20 | 90 days supply | Qty: 90 | Fill #1

## 2018-10-07 MED FILL — LISINOPRIL 20 MG TAB: 20 | 90 days supply | Qty: 90 | Fill #1

## 2018-10-15 MED FILL — DORZOLAMIDE HCL 2 % SOLN: 2 | 75 days supply | Qty: 20 | Fill #2

## 2018-10-15 MED FILL — BASAGLAR 100 UNIT/ML KWIKPE: 100 | 90 days supply | Qty: 30 | Fill #1

## 2018-10-16 MED FILL — BRIMONIDINE 0.2% EYE DROP: 0.2 | 37 days supply | Qty: 10 | Fill #0

## 2018-10-16 MED FILL — LATANOPROST 0.005% EYE DRP: 0.005 | 15 days supply | Qty: 3 | Fill #0

## 2018-10-16 MED FILL — TIMOLOL 0.5% EYE DROPS: 0.5 | 37 days supply | Qty: 10 | Fill #0

## 2019-01-21 ENCOUNTER — Other Ambulatory Visit: Payer: Self-pay | Admitting: Family Medicine

## 2019-01-21 DIAGNOSIS — I10 Essential (primary) hypertension: Secondary | ICD-10-CM

## 2019-01-21 DIAGNOSIS — IMO0002 Reserved for concepts with insufficient information to code with codable children: Secondary | ICD-10-CM

## 2019-01-21 DIAGNOSIS — E1165 Type 2 diabetes mellitus with hyperglycemia: Secondary | ICD-10-CM

## 2019-01-21 DIAGNOSIS — K219 Gastro-esophageal reflux disease without esophagitis: Secondary | ICD-10-CM

## 2019-01-21 MED FILL — BRIMONIDINE 0.2% EYE DROP: 0.2 | 37 days supply | Qty: 10 | Fill #1

## 2019-01-21 MED FILL — METFORMIN HCL ER 500 MG TB2: 500 | 90 days supply | Qty: 360 | Fill #1

## 2019-01-21 MED FILL — ATORVASTATIN CALCIUM 40 MG: 40 | 30 days supply | Qty: 30 | Fill #2

## 2019-01-21 MED FILL — DORZOLAMIDE HCL 2% EYE DRP: 2 | 37 days supply | Qty: 10 | Fill #3

## 2019-01-21 MED FILL — TIMOLOL 0.5% EYE DROPS: 0.5 | 37 days supply | Qty: 10 | Fill #1

## 2019-01-21 MED FILL — LATANOPROST 0.005% EYE DRP: 0.005 | 15 days supply | Qty: 3 | Fill #1

## 2019-01-27 ENCOUNTER — Other Ambulatory Visit: Payer: Self-pay | Admitting: Family Medicine

## 2019-01-27 DIAGNOSIS — I10 Essential (primary) hypertension: Secondary | ICD-10-CM

## 2019-01-27 DIAGNOSIS — E1165 Type 2 diabetes mellitus with hyperglycemia: Secondary | ICD-10-CM

## 2019-01-27 DIAGNOSIS — IMO0002 Reserved for concepts with insufficient information to code with codable children: Secondary | ICD-10-CM

## 2019-01-27 MED ORDER — BD PEN NEEDLE SHORT U/F 31G X 8 MM MISC: 1.0000 "application " | Freq: Every day | 0 refills | Status: DC

## 2019-01-27 MED ORDER — BASAGLAR KWIKPEN 100 UNIT/ML ~~LOC~~ SOPN: 30.0000 [IU] | PEN_INJECTOR | Freq: Every day | SUBCUTANEOUS | 0 refills | Status: DC

## 2019-01-27 MED ORDER — LISINOPRIL 20 MG PO TABS: 20.0000 mg | ORAL_TABLET | Freq: Every day | ORAL | 0 refills | Status: DC

## 2019-01-27 MED FILL — LISINOPRIL 20 MG TABLET: 20 | 30 days supply | Qty: 30 | Fill #0

## 2019-01-27 MED FILL — TRUEPLUS PEN NDL 31GX5/16: 31G X 8 MM | 25 days supply | Qty: 100 | Fill #0

## 2019-01-27 MED FILL — BASAGLAR 100 UNIT/ML KWIKPE: 100 | 30 days supply | Qty: 9 | Fill #0

## 2019-01-27 NOTE — Telephone Encounter (Signed)
1) Medication(s) Requested (by name): -lisinopril (PRINIVIL,ZESTRIL) 20 MG tablet  -Insulin Glargine (BASAGLAR KWIKPEN) 100 UNIT/ML SOPN  -Insulin Pen Needle (B-D ULTRAFINE III SHORT PEN) 31G X 8 MM MISC   2) Pharmacy of Choice: -[facility], [address]) Special Requests: Until 02/09/2019 F/U appt  Approved medications will be sent to the pharmacy, we will reach out if there is an issue.  Requests made after 3pm may not be addressed until the following business day!  If a patient is unsure of the name of the medication(s) please note and ask patient to call back when they are able to provide all info, do not send to responsible party until all information is available!

## 2019-02-09 ENCOUNTER — Ambulatory Visit: Payer: Medicare Other | Attending: Family Medicine | Admitting: Family Medicine

## 2019-02-09 ENCOUNTER — Other Ambulatory Visit: Payer: Self-pay

## 2019-02-09 ENCOUNTER — Encounter: Payer: Self-pay | Admitting: Family Medicine

## 2019-02-09 VITALS — BP 125/72 | HR 78 | Temp 98.3°F | Ht 63.0 in | Wt 148.0 lb

## 2019-02-09 DIAGNOSIS — E1149 Type 2 diabetes mellitus with other diabetic neurological complication: Secondary | ICD-10-CM | POA: Diagnosis not present

## 2019-02-09 DIAGNOSIS — Z79899 Other long term (current) drug therapy: Secondary | ICD-10-CM | POA: Diagnosis not present

## 2019-02-09 DIAGNOSIS — E1165 Type 2 diabetes mellitus with hyperglycemia: Secondary | ICD-10-CM | POA: Insufficient documentation

## 2019-02-09 DIAGNOSIS — E785 Hyperlipidemia, unspecified: Secondary | ICD-10-CM | POA: Diagnosis not present

## 2019-02-09 DIAGNOSIS — Z794 Long term (current) use of insulin: Secondary | ICD-10-CM | POA: Diagnosis not present

## 2019-02-09 DIAGNOSIS — I10 Essential (primary) hypertension: Secondary | ICD-10-CM | POA: Diagnosis not present

## 2019-02-09 DIAGNOSIS — D1721 Benign lipomatous neoplasm of skin and subcutaneous tissue of right arm: Secondary | ICD-10-CM | POA: Insufficient documentation

## 2019-02-09 DIAGNOSIS — M1990 Unspecified osteoarthritis, unspecified site: Secondary | ICD-10-CM | POA: Insufficient documentation

## 2019-02-09 DIAGNOSIS — Z905 Acquired absence of kidney: Secondary | ICD-10-CM | POA: Insufficient documentation

## 2019-02-09 DIAGNOSIS — Z791 Long term (current) use of non-steroidal anti-inflammatories (NSAID): Secondary | ICD-10-CM | POA: Diagnosis not present

## 2019-02-09 DIAGNOSIS — R6889 Other general symptoms and signs: Secondary | ICD-10-CM | POA: Diagnosis not present

## 2019-02-09 DIAGNOSIS — H04123 Dry eye syndrome of bilateral lacrimal glands: Secondary | ICD-10-CM | POA: Diagnosis not present

## 2019-02-09 DIAGNOSIS — H409 Unspecified glaucoma: Secondary | ICD-10-CM | POA: Diagnosis not present

## 2019-02-09 DIAGNOSIS — E118 Type 2 diabetes mellitus with unspecified complications: Secondary | ICD-10-CM

## 2019-02-09 DIAGNOSIS — K219 Gastro-esophageal reflux disease without esophagitis: Secondary | ICD-10-CM | POA: Diagnosis not present

## 2019-02-09 DIAGNOSIS — E1169 Type 2 diabetes mellitus with other specified complication: Secondary | ICD-10-CM | POA: Diagnosis not present

## 2019-02-09 DIAGNOSIS — IMO0002 Reserved for concepts with insufficient information to code with codable children: Secondary | ICD-10-CM

## 2019-02-09 LAB — POCT GLYCOSYLATED HEMOGLOBIN (HGB A1C): HbA1c, POC (controlled diabetic range): 10.3 % — AB (ref 0.0–7.0)

## 2019-02-09 LAB — GLUCOSE, POCT (MANUAL RESULT ENTRY): POC Glucose: 137 mg/dl — AB (ref 70–99)

## 2019-02-09 MED ORDER — BASAGLAR KWIKPEN 100 UNIT/ML ~~LOC~~ SOPN: 25.0000 [IU] | PEN_INJECTOR | Freq: Every day | SUBCUTANEOUS | 0 refills | Status: DC

## 2019-02-09 MED ORDER — GABAPENTIN 300 MG PO CAPS: 300.0000 mg | ORAL_CAPSULE | Freq: Every day | ORAL | 3 refills | Status: DC

## 2019-02-09 MED ORDER — PANTOPRAZOLE SODIUM 20 MG PO TBEC: 20.0000 mg | DELAYED_RELEASE_TABLET | Freq: Every day | ORAL | 1 refills | Status: DC

## 2019-02-09 MED ORDER — ATORVASTATIN CALCIUM 40 MG PO TABS: 40.0000 mg | ORAL_TABLET | Freq: Every day | ORAL | 1 refills | Status: DC

## 2019-02-09 MED ORDER — LISINOPRIL 20 MG PO TABS: 20.0000 mg | ORAL_TABLET | Freq: Every day | ORAL | 1 refills | Status: DC

## 2019-02-09 MED ORDER — METFORMIN HCL ER 500 MG PO TB24: 1000.0000 mg | ORAL_TABLET | Freq: Two times a day (BID) | ORAL | 1 refills | Status: DC

## 2019-02-09 MED FILL — GABAPENTIN 300 MG CAPSULE: 300 | 30 days supply | Qty: 30 | Fill #0

## 2019-02-09 MED FILL — PANTOPRAZOLE SOD DR 20 MG T: 20 | 30 days supply | Qty: 30 | Fill #0

## 2019-02-09 NOTE — Progress Notes (Signed)
Subjective:  Patient ID: Tina Phelps, female    DOB: September 15, 1945  Age: 73 y.o. MRN: [id number]  CC: Diabetes   HPI Tina Phelps  is a 73 year old female with a history of type 2 diabetes mellitus (A1c 10.3), hypertension, hyperlipidemia, osteoarthritis, GERD who presents today accompanied by her daughter for a follow-up visit. She has been administering 20 units of Lantus rather than 30 units which has been prescribed and fasting sugars have ranged between 160 and 200.  She complains of numbness in her hands with associated decreased grip and difficulty opening jaw rales.  She also complains of dry eyes but denies decrease in vision.  She is status post cataract extraction in her right eye and does have an ophthalmologist.  She is concerned about a right arm swelling which has been present for over 1 year and is not increasing in size, not tender and she would just like this checked out. Reflux symptoms are controlled and she is doing well on her antihypertensive and her statin.  Past Medical History:  Diagnosis Date  . Arthritis   . Diabetes mellitus without complication ([place])   . Glaucoma   . Hypertension     Past Surgical History:  Procedure Laterality Date  . CHOLECYSTECTOMY    . NEPHRECTOMY Right   . OOPHORECTOMY Right     Family History  Problem Relation Age of Onset  . Diabetes Sister   . Diabetes Brother   . Diabetes Daughter     No Known Allergies  Outpatient Medications Prior to Visit  Medication Sig Dispense Refill  . Blood Glucose Monitoring Suppl (ACCU-CHEK AVIVA PLUS) w/Device KIT Use as directed 1 kit 0  . brimonidine (ALPHAGAN) 0.2 % ophthalmic solution Place 1 drop into both eyes 3 (three) times daily.    . diclofenac sodium (VOLTAREN) 1 % GEL Apply 2 g topically 4 (four) times daily. 300 g 3  . dorzolamide-timolol (COSOPT) 22.3-6.8 MG/ML ophthalmic solution Place 1 drop into both eyes 2 (two) times daily. 10 mL 3  . glucose blood (ACCU-CHEK AVIVA PLUS)  test strip Use to check blood sugars twice a day. DX Code E11.8 100 each 12  . Insulin Pen Needle (B-D ULTRAFINE III SHORT PEN) 31G X 8 MM MISC 1 application by Does not apply route daily. 100 each 0  . latanoprost (XALATAN) 0.005 % ophthalmic solution 1 drop at bedtime.    . timolol (BETIMOL) 0.5 % ophthalmic solution Place 1 drop into both eyes at bedtime. 10 mL 3  . atorvastatin (LIPITOR) 40 MG tablet Take 1 tablet (40 mg total) by mouth daily. 90 tablet 1  . Insulin Glargine (BASAGLAR KWIKPEN) 100 UNIT/ML SOPN Inject 0.3 mLs (30 Units total) into the skin daily. 9 mL 0  . lisinopril (ZESTRIL) 20 MG tablet Take 1 tablet (20 mg total) by mouth daily. 30 tablet 0  . metFORMIN (GLUCOPHAGE-XR) 500 MG 24 hr tablet Take 2 tablets (1,000 mg total) by mouth 2 (two) times daily with a meal. 360 tablet 1  . pantoprazole (PROTONIX) 20 MG tablet Take 1 tablet (20 mg total) by mouth daily. 90 tablet 1  . ACCU-CHEK FASTCLIX LANCETS MISC Use as directed 100 each 3   No facility-administered medications prior to visit.      ROS Review of Systems  Constitutional: Negative for activity change, appetite change and fatigue.  HENT: Negative for congestion, sinus pressure and sore throat.   Eyes: Negative for visual disturbance.  Respiratory: Negative for cough, chest tightness, shortness  of breath and wheezing.   Cardiovascular: Negative for chest pain and palpitations.  Gastrointestinal: Negative for abdominal distention, abdominal pain and constipation.  Endocrine: Negative for polydipsia.  Genitourinary: Negative for dysuria and frequency.  Musculoskeletal: Negative for arthralgias and back pain.  Skin: Negative for rash.  Neurological: Positive for numbness. Negative for tremors and light-headedness.  Hematological: Does not bruise/bleed easily.  Psychiatric/Behavioral: Negative for agitation and behavioral problems.    Objective:  BP 125/72   Pulse 78   Temp 98.3 F (36.8 C) (Oral)   Ht 5' 3"  (1.6 m)   Wt 148 lb (67.1 kg)   SpO2 99%   BMI 26.22 kg/m   BP/Weight 02/09/2019 45/62/5638 03/03/7341  Systolic BP 876 811 572  Diastolic BP 72 80 82  Wt. (Lbs) 148 145.6 146.6  BMI 26.22 25.79 25.97      Physical Exam Constitutional:      Appearance: She is well-developed.  Cardiovascular:     Rate and Rhythm: Normal rate.     Heart sounds: Normal heart sounds. No murmur.  Pulmonary:     Effort: Pulmonary effort is normal.     Breath sounds: Normal breath sounds. No wheezing or rales.  Chest:     Chest wall: No tenderness.  Abdominal:     General: Bowel sounds are normal. There is no distension.     Palpations: Abdomen is soft. There is no mass.     Tenderness: There is no abdominal tenderness.  Musculoskeletal: Normal range of motion.  Skin:    Comments: Soft tissue in lateral aspect of right upper arm, not tender  Neurological:     Mental Status: She is alert and oriented to person, place, and time.     CMP Latest Ref Rng & Units 10/29/2017 11/15/2016 09/30/2014  Glucose 65 - 99 mg/dL 203(H) 182(H) 260(H)  BUN 8 - 27 mg/dL _0 Creatinine 0.57 - 1.00 mg/dL 0.94 0.99 0.81  Sodium 134 - 144 mmol/L 140 137 138  Potassium 3.5 - 5.2 mmol/L 4.3 4.0 4.1  Chloride 96 - 106 mmol/L 102 99 102  CO2 20 - 29 mmol/L _1 Calcium 8.7 - 10.3 mg/dL 9.7 9.8 9.3  Total Protein 6.0 - 8.5 g/dL 6.8 6.8 6.8  Total Bilirubin 0.0 - 1.2 mg/dL 0.3 0.4 0.4  Alkaline Phos 39 - 117 IU/L 127(H) 106 103  AST 0 - 40 IU/L _2 ALT 0 - 32 IU/L _3 Lipid Panel     Component Value Date/Time   CHOL 171 10/29/2017 1207   TRIG 113 10/29/2017 1207   HDL 67 10/29/2017 1207   CHOLHDL 2.6 10/29/2017 1207   CHOLHDL 3.2 12/15/2013 1011   VLDL 26 12/15/2013 1011   LDLCALC 81 10/29/2017 1207    CBC    Component Value Date/Time   WBC 7.4 12/15/2013 1011   RBC 4.71 12/15/2013 1011   HGB 13.9 12/15/2013 1011   HCT 40.2 12/15/2013 1011   PLT 323 12/15/2013 1011   MCV 85.4  12/15/2013 1011   MCH 29.5 12/15/2013 1011   MCHC 34.6 12/15/2013 1011   RDW 12.7 12/15/2013 1011   LYMPHSABS 2.7 12/15/2013 1011   MONOABS 0.4 12/15/2013 1011   EOSABS 0.1 12/15/2013 1011   BASOSABS 0.0 12/15/2013 1011    Lab Results  Component Value Date   HGBA1C 10.3 (A) 02/09/2019    Assessment & Plan:   1. Diabe3tes mellitus type 2, uncontrolled,  with complications ([place]) Uncontrolled with A1c of 10.3 She was prescribed 30 units of Lantus but has been taking 20 units Increased to 25 units and will reassess at her next visit Will exercise caution to prevent hypoglycemia Counseled on Diabetic diet, my plate method, 177 minutes of moderate intensity exercise/week Keep blood sugar logs with fasting goals of 80-120 mg/dl, random of less than 180 and in the event of sugars less than 60 mg/dl or greater than 400 mg/dl please notify the clinic ASAP. It is recommended that you undergo annual eye exams and annual foot exams. Pneumonia vaccine is recommended. - POCT glucose (manual entry) - POCT glycosylated hemoglobin (Hb A1C) - Insulin Glargine (BASAGLAR KWIKPEN) 100 UNIT/ML SOPN; Inject 0.25 mLs (25 Units total) into the skin daily.  Dispense: 9 mL; Refill: 0 - CMP14+EGFR - Lipid panel - Microalbumin / creatinine urine ratio - metFORMIN (GLUCOPHAGE-XR) 500 MG 24 hr tablet; Take 2 tablets (1,000 mg total) by mouth 2 (two) times daily with a meal.  Dispense: 360 tablet; Refill: 1 - lisinopril (ZESTRIL) 20 MG tablet; Take 1 tablet (20 mg total) by mouth daily.  Dispense: 90 tablet; Refill: 1  2. Other diabetic neurological complication associated with type 2 diabetes mellitus ([place]) Likely exacerbated by uncontrolled diabetes mellitus Comments gabapentin-discussed sedating side effects and she will take this at night - gabapentin (NEURONTIN) 300 MG capsule; Take 1 capsule (300 mg total) by mouth at bedtime.  Dispense: 30 capsule; Refill: 3  3. Dry eyes Advised to obtain artificial  tears She does have an ophthalmologist who she will be following up with  4. Essential hypertension, benign Controlled Counseled on blood pressure goal of less than 130/80, low-sodium, DASH diet, medication compliance, 150 minutes of moderate intensity exercise per week. Discussed medication compliance, adverse effects. - lisinopril (ZESTRIL) 20 MG tablet; Take 1 tablet (20 mg total) by mouth daily.  Dispense: 90 tablet; Refill: 1  5. Gastroesophageal reflux disease without esophagitis - pantoprazole (PROTONIX) 20 MG tablet; Take 1 tablet (20 mg total) by mouth daily.  Dispense: 90 tablet; Refill: 1  6. Hyperlipidemia associated with type 2 diabetes mellitus (HCC) Controlled Low-cholesterol diet - atorvastatin (LIPITOR) 40 MG tablet; Take 1 tablet (40 mg total) by mouth daily.  Dispense: 90 tablet; Refill: 1  7. Lipoma of right upper extremity She has been reassured regarding this Advised to monitor this for increase in size and dystrophic change in management   Health Care Maintenance: She is not interested in a mammogram or bone density Meds ordered this encounter  Medications  . Insulin Glargine (BASAGLAR KWIKPEN) 100 UNIT/ML SOPN    Sig: Inject 0.25 mLs (25 Units total) into the skin daily.    Dispense:  9 mL    Refill:  0    90 day supply; discontinue previous dose  . gabapentin (NEURONTIN) 300 MG capsule    Sig: Take 1 capsule (300 mg total) by mouth at bedtime.    Dispense:  30 capsule    Refill:  3  . metFORMIN (GLUCOPHAGE-XR) 500 MG 24 hr tablet    Sig: Take 2 tablets (1,000 mg total) by mouth 2 (two) times daily with a meal.    Dispense:  360 tablet    Refill:  1  . lisinopril (ZESTRIL) 20 MG tablet    Sig: Take 1 tablet (20 mg total) by mouth daily.    Dispense:  90 tablet    Refill:  1  . pantoprazole (PROTONIX) 20 MG tablet  Sig: Take 1 tablet (20 mg total) by mouth daily.    Dispense:  90 tablet    Refill:  1  . atorvastatin (LIPITOR) 40 MG tablet     Sig: Take 1 tablet (40 mg total) by mouth daily.    Dispense:  90 tablet    Refill:  1    Follow-up: Return in about 6 months (around 08/12/2019) for medical conditions.       Charlott Rakes, MD, FAAFP. [facility] and [facility], [place]   02/09/2019, 11:41 AM

## 2019-02-09 NOTE — Progress Notes (Signed)
Patient is having dry eyes.  Patient has lump on right arm.

## 2019-02-09 NOTE — Patient Instructions (Signed)
Lipoma  A lipoma is a noncancerous (benign) tumor that is made up of fat cells. This is a very common type of soft-tissue growth. Lipomas are usually found under the skin (subcutaneous). They may occur in any tissue of the body that contains fat. Common areas for lipomas to appear include the back, shoulders, buttocks, and thighs.  Lipomas grow slowly, and they are usually painless. Most lipomas do not cause problems and do not require treatment. What are the causes? The cause of this condition is not known. What increases the risk? You are more likely to develop this condition if:  You are 40-60 years old.  You have a family history of lipomas. What are the signs or symptoms? A lipoma usually appears as a small, round bump under the skin. In most cases, the lump will:  Feel soft or rubbery.  Not cause pain or other symptoms. However, if a lipoma is located in an area where it pushes on nerves, it can become painful or cause other symptoms. How is this diagnosed? A lipoma can usually be diagnosed with a physical exam. You may also have tests to confirm the diagnosis and to rule out other conditions. Tests may include:  Imaging tests, such as a CT scan or MRI.  Removal of a tissue sample to be looked at under a microscope (biopsy). How is this treated? Treatment for this condition depends on the size of the lipoma and whether it is causing any symptoms.  For small lipomas that are not causing problems, no treatment is needed.  If a lipoma is bigger or it causes problems, surgery may be done to remove the lipoma. Lipomas can also be removed to improve appearance. Most often, the procedure is done after applying a medicine that numbs the area (local anesthetic). Follow these instructions at home:  Watch your lipoma for any changes.  Keep all follow-up visits as told by your health care provider. This is important. Contact a health care provider if:  Your lipoma becomes larger or  hard.  Your lipoma becomes painful, red, or increasingly swollen. These could be signs of infection or a more serious condition. Get help right away if:  You develop tingling or numbness in an area near the lipoma. This could indicate that the lipoma is causing nerve damage. Summary  A lipoma is a noncancerous tumor that is made up of fat cells.  Most lipomas do not cause problems and do not require treatment.  If a lipoma is bigger or it causes problems, surgery may be done to remove the lipoma. This information is not intended to replace advice given to you by your health care provider. Make sure you discuss any questions you have with your health care provider. Document Released: 06/07/2002 Document Revised: 06/03/2017 Document Reviewed: 06/03/2017 Elsevier Patient Education  2020 [employer] Inc.  

## 2019-02-10 LAB — CMP14+EGFR
ALT: 13 IU/L (ref 0–32)
AST: 17 IU/L (ref 0–40)
Albumin/Globulin Ratio: 1.8 (ref 1.2–2.2)
Albumin: 4.3 g/dL (ref 3.8–4.8)
Alkaline Phosphatase: 104 IU/L (ref 39–117)
BUN/Creatinine Ratio: 21 (ref 12–28)
BUN: 21 mg/dL (ref 8–27)
Bilirubin Total: 0.5 mg/dL (ref 0.0–1.2)
CO2: 24 mmol/L (ref 20–29)
Calcium: 9.8 mg/dL (ref 8.7–10.3)
Chloride: 103 mmol/L (ref 96–106)
Creatinine, Ser: 1.02 mg/dL — ABNORMAL HIGH (ref 0.57–1.00)
GFR calc Af Amer: 64 mL/min/{1.73_m2} (ref >59)
GFR calc non Af Amer: 56 mL/min/{1.73_m2} — ABNORMAL LOW (ref >59)
Globulin, Total: 2.4 g/dL (ref 1.5–4.5)
Glucose: 143 mg/dL — ABNORMAL HIGH (ref 65–99)
Potassium: 4.5 mmol/L (ref 3.5–5.2)
Sodium: 141 mmol/L (ref 134–144)
Total Protein: 6.7 g/dL (ref 6.0–8.5)

## 2019-02-10 LAB — LIPID PANEL
Chol/HDL Ratio: 2.9 ratio (ref 0.0–4.4)
Cholesterol, Total: 180 mg/dL (ref 100–199)
HDL: 63 mg/dL (ref >39)
LDL Calculated: 88 mg/dL (ref 0–99)
Triglycerides: 146 mg/dL (ref 0–149)
VLDL Cholesterol Cal: 29 mg/dL (ref 5–40)

## 2019-02-10 LAB — MICROALBUMIN / CREATININE URINE RATIO
Creatinine, Urine: 203.8 mg/dL
Microalb/Creat Ratio: 13 mg/g creat (ref 0–29)
Microalbumin, Urine: 26.4 ug/mL

## 2019-02-17 ENCOUNTER — Telehealth: Payer: Self-pay

## 2019-02-17 NOTE — Telephone Encounter (Signed)
-----   Message from Charlott Rakes, MD sent at 02/10/2019  5:10 PM EDT ----- Labs are normal.

## 2019-02-17 NOTE — Telephone Encounter (Signed)
Patient was called and a voicemail was left informing patient to return phone call for lab results. 

## 2019-04-05 ENCOUNTER — Other Ambulatory Visit: Payer: Self-pay | Admitting: Family Medicine

## 2019-04-05 DIAGNOSIS — E1165 Type 2 diabetes mellitus with hyperglycemia: Secondary | ICD-10-CM

## 2019-04-05 DIAGNOSIS — IMO0002 Reserved for concepts with insufficient information to code with codable children: Secondary | ICD-10-CM

## 2019-04-05 MED FILL — DICLOFENAC SODIUM 1% GEL: 1 | 36 days supply | Qty: 300 | Fill #1

## 2019-04-05 MED FILL — DORZOLAMIDE HCL 2% EYE DRP: 2 | 37 days supply | Qty: 10 | Fill #0

## 2019-04-05 MED FILL — LISINOPRIL 20 MG TABLET: 20 | 30 days supply | Qty: 30 | Fill #0

## 2019-04-05 MED FILL — GABAPENTIN 300 MG CAPSULE: 300 | 30 days supply | Qty: 30 | Fill #1

## 2019-04-05 MED FILL — ATORVASTATIN CALCIUM 40 MG: 40 | 30 days supply | Qty: 30 | Fill #0

## 2019-04-05 MED FILL — PANTOPRAZOLE SOD DR 20 MG T: 20 | 30 days supply | Qty: 30 | Fill #1

## 2019-04-05 MED FILL — TRUEPLUS SYR 1ML 31GX5/16: 31G X 5/16" | 25 days supply | Qty: 100 | Fill #0

## 2019-04-05 MED FILL — BASAGLAR 100 UNIT/ML KWIKPE: 100 | 24 days supply | Qty: 6 | Fill #0

## 2019-05-06 ENCOUNTER — Other Ambulatory Visit: Payer: Self-pay | Admitting: Family Medicine

## 2019-05-06 DIAGNOSIS — IMO0002 Reserved for concepts with insufficient information to code with codable children: Secondary | ICD-10-CM

## 2019-05-06 DIAGNOSIS — E1165 Type 2 diabetes mellitus with hyperglycemia: Secondary | ICD-10-CM

## 2019-05-06 MED FILL — ATORVASTATIN CALCIUM 40 MG: 40 | 90 days supply | Qty: 90 | Fill #1

## 2019-05-06 MED FILL — GABAPENTIN 300 MG CAPSULE: 300 | 60 days supply | Qty: 60 | Fill #2

## 2019-05-06 MED FILL — TRUEPLUS 5-BEVEL PEN NEEDLE: 31G X 5 MM | 25 days supply | Qty: 100 | Fill #0

## 2019-05-06 MED FILL — BASAGLAR 100 UNIT/ML KWIKPE: 100 | 87 days supply | Qty: 21 | Fill #0

## 2019-05-06 MED FILL — PANTOPRAZOLE SOD DR 20 MG T: 20 | 90 days supply | Qty: 90 | Fill #2

## 2019-05-06 MED FILL — LISINOPRIL 20 MG TABLET: 20 | 90 days supply | Qty: 90 | Fill #1

## 2019-05-06 MED FILL — METFORMIN HCL ER 500 MG TB2: 500 | 90 days supply | Qty: 360 | Fill #0

## 2019-05-06 NOTE — Telephone Encounter (Signed)
Please fill if appropriate. Last filled in September with higher dose and no refills.

## 2019-05-07 ENCOUNTER — Other Ambulatory Visit: Payer: Self-pay

## 2019-05-07 DIAGNOSIS — H401132 Primary open-angle glaucoma, bilateral, moderate stage: Secondary | ICD-10-CM | POA: Diagnosis not present

## 2019-05-07 DIAGNOSIS — Z20822 Contact with and (suspected) exposure to covid-19: Secondary | ICD-10-CM

## 2019-05-07 DIAGNOSIS — Z20828 Contact with and (suspected) exposure to other viral communicable diseases: Secondary | ICD-10-CM | POA: Diagnosis not present

## 2019-05-07 MED FILL — DORZOLAMIDE-TIMOLOL EYE DRP: 22.3-6.8 | 90 days supply | Qty: 30 | Fill #0

## 2019-05-07 MED FILL — BRIMONIDINE 0.2% EYE DROP: 0.2 | 90 days supply | Qty: 30 | Fill #0

## 2019-05-07 MED FILL — LATANOPROST 0.005% EYE DRP: 0.005 | 56 days supply | Qty: 8 | Fill #0

## 2019-05-08 LAB — NOVEL CORONAVIRUS, NAA: SARS-CoV-2, NAA: NOT DETECTED

## 2019-05-11 ENCOUNTER — Encounter: Payer: Self-pay | Admitting: Family Medicine

## 2019-05-12 DIAGNOSIS — Z1159 Encounter for screening for other viral diseases: Secondary | ICD-10-CM | POA: Diagnosis not present

## 2019-07-01 ENCOUNTER — Other Ambulatory Visit: Payer: Self-pay | Admitting: Family Medicine

## 2019-07-01 DIAGNOSIS — IMO0002 Reserved for concepts with insufficient information to code with codable children: Secondary | ICD-10-CM

## 2019-07-01 DIAGNOSIS — E1149 Type 2 diabetes mellitus with other diabetic neurological complication: Secondary | ICD-10-CM

## 2019-07-01 DIAGNOSIS — E1165 Type 2 diabetes mellitus with hyperglycemia: Secondary | ICD-10-CM

## 2019-07-01 MED FILL — GABAPENTIN 300 MG CAPSULE: 300 | 30 days supply | Qty: 30 | Fill #0

## 2019-07-01 MED FILL — TRUEPLUS 5-BEVEL PEN NEEDLE: 31G X 5 MM | 25 days supply | Qty: 100 | Fill #0

## 2019-07-01 MED FILL — LATANOPROST 0.005% EYE DRP: 0.005 | 56 days supply | Qty: 8 | Fill #1

## 2019-07-01 MED FILL — DORZOLAMIDE-TIMOLOL EYE DRP: 22.3-6.8 | 90 days supply | Qty: 30 | Fill #1

## 2019-07-01 MED FILL — BASAGLAR 100 UNIT/ML KWIKPE: 100 | 87 days supply | Qty: 21 | Fill #1

## 2019-07-01 MED FILL — ATORVASTATIN CALCIUM 40 MG: 40 | 60 days supply | Qty: 60 | Fill #2

## 2019-07-01 MED FILL — LISINOPRIL 20 MG TABLET: 20 | 60 days supply | Qty: 60 | Fill #2

## 2019-07-01 MED FILL — BRIMONIDINE 0.2% EYE DROP: 0.2 | 90 days supply | Qty: 30 | Fill #1

## 2019-07-01 MED FILL — PANTOPRAZOLE SOD DR 20 MG T: 20 | 30 days supply | Qty: 30 | Fill #3

## 2019-07-01 MED FILL — metFORMIN HCL ER 500 MG TB2: 500 | 90 days supply | Qty: 360 | Fill #1

## 2019-11-19 ENCOUNTER — Other Ambulatory Visit: Payer: Self-pay | Admitting: Ophthalmology

## 2019-11-19 DIAGNOSIS — H401132 Primary open-angle glaucoma, bilateral, moderate stage: Secondary | ICD-10-CM | POA: Diagnosis not present

## 2019-11-19 DIAGNOSIS — E113312 Type 2 diabetes mellitus with moderate nonproliferative diabetic retinopathy with macular edema, left eye: Secondary | ICD-10-CM | POA: Diagnosis not present

## 2019-11-19 DIAGNOSIS — Z794 Long term (current) use of insulin: Secondary | ICD-10-CM | POA: Diagnosis not present

## 2019-11-19 MED FILL — DORZOLAMIDE-TIMOLOL EYE DRP: 22.3-6.8 | 90 days supply | Qty: 30 | Fill #0

## 2019-11-19 MED FILL — BRIMONIDINE 0.2% EYE DROP: 0.2 | 90 days supply | Qty: 30 | Fill #0

## 2019-11-19 MED FILL — LATANOPROST 0.005% EYE DRP: 0.005 | 56 days supply | Qty: 8 | Fill #0

## 2019-11-24 ENCOUNTER — Other Ambulatory Visit: Payer: Self-pay | Admitting: Family Medicine

## 2019-11-24 DIAGNOSIS — IMO0002 Reserved for concepts with insufficient information to code with codable children: Secondary | ICD-10-CM

## 2019-11-24 DIAGNOSIS — E785 Hyperlipidemia, unspecified: Secondary | ICD-10-CM

## 2019-11-24 DIAGNOSIS — I10 Essential (primary) hypertension: Secondary | ICD-10-CM

## 2019-11-24 DIAGNOSIS — K219 Gastro-esophageal reflux disease without esophagitis: Secondary | ICD-10-CM

## 2019-11-24 MED FILL — BASAGLAR 100 UNIT/ML KWIKPE: 100 | 87 days supply | Qty: 21 | Fill #2

## 2019-12-06 DIAGNOSIS — E113312 Type 2 diabetes mellitus with moderate nonproliferative diabetic retinopathy with macular edema, left eye: Secondary | ICD-10-CM | POA: Diagnosis not present

## 2019-12-06 DIAGNOSIS — H35032 Hypertensive retinopathy, left eye: Secondary | ICD-10-CM | POA: Diagnosis not present

## 2019-12-06 DIAGNOSIS — H43822 Vitreomacular adhesion, left eye: Secondary | ICD-10-CM | POA: Diagnosis not present

## 2019-12-06 DIAGNOSIS — H1811 Bullous keratopathy, right eye: Secondary | ICD-10-CM | POA: Diagnosis not present

## 2019-12-13 ENCOUNTER — Other Ambulatory Visit: Payer: Self-pay | Admitting: Family Medicine

## 2019-12-13 DIAGNOSIS — IMO0002 Reserved for concepts with insufficient information to code with codable children: Secondary | ICD-10-CM

## 2019-12-13 DIAGNOSIS — E1169 Type 2 diabetes mellitus with other specified complication: Secondary | ICD-10-CM

## 2019-12-13 DIAGNOSIS — I10 Essential (primary) hypertension: Secondary | ICD-10-CM

## 2019-12-13 DIAGNOSIS — K219 Gastro-esophageal reflux disease without esophagitis: Secondary | ICD-10-CM

## 2019-12-13 MED FILL — PANTOPRAZOLE SOD DR 20 MG T: 20 | 30 days supply | Qty: 30 | Fill #0

## 2019-12-13 MED FILL — ATORVASTATIN CALCIUM 40 MG: 40 | 30 days supply | Qty: 30 | Fill #0

## 2019-12-14 ENCOUNTER — Telehealth: Payer: Self-pay

## 2019-12-14 DIAGNOSIS — I10 Essential (primary) hypertension: Secondary | ICD-10-CM

## 2019-12-14 DIAGNOSIS — IMO0002 Reserved for concepts with insufficient information to code with codable children: Secondary | ICD-10-CM

## 2019-12-14 NOTE — Telephone Encounter (Signed)
She is due for an office visit.  Please have her schedule an office visit with me in person and then we can provide a 1 month refill.

## 2019-12-14 NOTE — Telephone Encounter (Signed)
Please call [phone] daughter to let her know.

## 2019-12-14 NOTE — Telephone Encounter (Signed)
Patient is requesting a refill on metformin and lisinopril.

## 2019-12-15 MED ORDER — METFORMIN HCL ER 500 MG PO TB24: 1000.0000 mg | ORAL_TABLET | Freq: Two times a day (BID) | ORAL | 0 refills | Status: DC

## 2019-12-15 MED ORDER — LISINOPRIL 20 MG PO TABS: 20.0000 mg | ORAL_TABLET | Freq: Every day | ORAL | 0 refills | Status: DC

## 2019-12-15 MED FILL — metFORMIN HCL ER 500 MG TB2: 500 | 15 days supply | Qty: 60 | Fill #0

## 2019-12-15 MED FILL — LISINOPRIL 20 MG TABLET: 20 | 30 days supply | Qty: 30 | Fill #0

## 2019-12-15 NOTE — Telephone Encounter (Signed)
Refilled

## 2019-12-15 NOTE — Telephone Encounter (Signed)
Patient has an appointment set on 12/21/19 at 230. She was informed of that when she arrived at the clinic to request the refill

## 2019-12-16 NOTE — Telephone Encounter (Signed)
Patient's daughter was called and informed of refills and to keep upcoming appointment.

## 2019-12-21 ENCOUNTER — Encounter: Payer: Self-pay | Admitting: Family Medicine

## 2019-12-21 ENCOUNTER — Other Ambulatory Visit: Payer: Self-pay | Admitting: Family Medicine

## 2019-12-21 ENCOUNTER — Ambulatory Visit: Payer: Medicare Other | Attending: Family Medicine | Admitting: Family Medicine

## 2019-12-21 ENCOUNTER — Other Ambulatory Visit: Payer: Self-pay

## 2019-12-21 VITALS — BP 159/88 | HR 73 | Ht 63.0 in | Wt 147.6 lb

## 2019-12-21 DIAGNOSIS — Z23 Encounter for immunization: Secondary | ICD-10-CM | POA: Diagnosis not present

## 2019-12-21 DIAGNOSIS — E1140 Type 2 diabetes mellitus with diabetic neuropathy, unspecified: Secondary | ICD-10-CM | POA: Diagnosis not present

## 2019-12-21 DIAGNOSIS — Z79899 Other long term (current) drug therapy: Secondary | ICD-10-CM | POA: Insufficient documentation

## 2019-12-21 DIAGNOSIS — E119 Type 2 diabetes mellitus without complications: Secondary | ICD-10-CM | POA: Diagnosis present

## 2019-12-21 DIAGNOSIS — I10 Essential (primary) hypertension: Secondary | ICD-10-CM | POA: Diagnosis not present

## 2019-12-21 DIAGNOSIS — Z794 Long term (current) use of insulin: Secondary | ICD-10-CM | POA: Insufficient documentation

## 2019-12-21 DIAGNOSIS — E1149 Type 2 diabetes mellitus with other diabetic neurological complication: Secondary | ICD-10-CM

## 2019-12-21 DIAGNOSIS — K219 Gastro-esophageal reflux disease without esophagitis: Secondary | ICD-10-CM | POA: Diagnosis not present

## 2019-12-21 DIAGNOSIS — B351 Tinea unguium: Secondary | ICD-10-CM | POA: Insufficient documentation

## 2019-12-21 DIAGNOSIS — Z905 Acquired absence of kidney: Secondary | ICD-10-CM | POA: Insufficient documentation

## 2019-12-21 DIAGNOSIS — Z833 Family history of diabetes mellitus: Secondary | ICD-10-CM | POA: Insufficient documentation

## 2019-12-21 DIAGNOSIS — E785 Hyperlipidemia, unspecified: Secondary | ICD-10-CM | POA: Insufficient documentation

## 2019-12-21 DIAGNOSIS — E118 Type 2 diabetes mellitus with unspecified complications: Secondary | ICD-10-CM | POA: Diagnosis not present

## 2019-12-21 DIAGNOSIS — IMO0002 Reserved for concepts with insufficient information to code with codable children: Secondary | ICD-10-CM

## 2019-12-21 DIAGNOSIS — E1169 Type 2 diabetes mellitus with other specified complication: Secondary | ICD-10-CM | POA: Diagnosis not present

## 2019-12-21 DIAGNOSIS — Z9049 Acquired absence of other specified parts of digestive tract: Secondary | ICD-10-CM | POA: Insufficient documentation

## 2019-12-21 DIAGNOSIS — M1990 Unspecified osteoarthritis, unspecified site: Secondary | ICD-10-CM | POA: Insufficient documentation

## 2019-12-21 DIAGNOSIS — H409 Unspecified glaucoma: Secondary | ICD-10-CM | POA: Diagnosis not present

## 2019-12-21 DIAGNOSIS — E1165 Type 2 diabetes mellitus with hyperglycemia: Secondary | ICD-10-CM | POA: Diagnosis not present

## 2019-12-21 LAB — POCT GLYCOSYLATED HEMOGLOBIN (HGB A1C): HbA1c, POC (controlled diabetic range): 8.4 % — AB (ref 0.0–7.0)

## 2019-12-21 LAB — GLUCOSE, POCT (MANUAL RESULT ENTRY): POC Glucose: 91 mg/dl (ref 70–99)

## 2019-12-21 MED ORDER — PANTOPRAZOLE SODIUM 20 MG PO TBEC: 20.0000 mg | DELAYED_RELEASE_TABLET | Freq: Every day | ORAL | 1 refills | Status: DC

## 2019-12-21 MED ORDER — GABAPENTIN 300 MG PO CAPS: 300.0000 mg | ORAL_CAPSULE | Freq: Every day | ORAL | 1 refills | Status: DC

## 2019-12-21 MED ORDER — LISINOPRIL 20 MG PO TABS: 20.0000 mg | ORAL_TABLET | Freq: Every day | ORAL | 1 refills | Status: DC

## 2019-12-21 MED ORDER — CICLOPIROX 8 % EX SOLN: Freq: Every day | CUTANEOUS | 0 refills | Status: DC

## 2019-12-21 MED ORDER — BASAGLAR KWIKPEN 100 UNIT/ML ~~LOC~~ SOPN: 25.0000 [IU] | PEN_INJECTOR | Freq: Every day | SUBCUTANEOUS | 6 refills | Status: DC

## 2019-12-21 MED ORDER — METFORMIN HCL ER 500 MG PO TB24: 1000.0000 mg | ORAL_TABLET | Freq: Every day | ORAL | 1 refills | Status: DC

## 2019-12-21 MED ORDER — ATORVASTATIN CALCIUM 40 MG PO TABS: 40.0000 mg | ORAL_TABLET | Freq: Every day | ORAL | 1 refills | Status: DC

## 2019-12-21 MED FILL — GABAPENTIN 300 MG CAPSULE: 300 | 30 days supply | Qty: 30 | Fill #0

## 2019-12-21 NOTE — Progress Notes (Signed)
Subjective:  Patient ID: Tina Phelps, female    DOB: April 20, 1946  Age: 74 y.o. MRN: [id number]  CC: Diabetes   HPI Tina Phelps  is a 74 year old female with a history of type 2 diabetes mellitus (A1c 8.4), hypertension, hyperlipidemia, osteoarthritis, GERD who presents today accompanied by her daughter for a follow-up visit. Daughter complains she has a reduced appetite and this has been present since 08/2019 when she had Gastroenteritis when she travelled to [place].  She is also unsure if this could be related to her Metformin. A1c is 8.4 which is down from 10.3 previously and she endorses being out of her medications for a few weeks.  Neuropathy is controlled on gabapentin.  She denies visual concerns. Daughter is concerned about thick big toenails which make it difficult for clipping.  Her blood pressure is elevated and she has been out of her antihypertensive.  Other medical conditions are stable. She has no additional concerns today. Past Medical History:  Diagnosis Date  . Arthritis   . Diabetes mellitus without complication ([place])   . Glaucoma   . Hypertension     Past Surgical History:  Procedure Laterality Date  . CHOLECYSTECTOMY    . NEPHRECTOMY Right   . OOPHORECTOMY Right     Family History  Problem Relation Age of Onset  . Diabetes Sister   . Diabetes Brother   . Diabetes Daughter     No Known Allergies  Outpatient Medications Prior to Visit  Medication Sig Dispense Refill  . ACCU-CHEK FASTCLIX LANCETS MISC Use as directed 100 each 3  . atorvastatin (LIPITOR) 40 MG tablet TAKE 1 TABLET (40 MG TOTAL) BY MOUTH DAILY. 60 tablet 1  . Blood Glucose Monitoring Suppl (ACCU-CHEK AVIVA PLUS) w/Device KIT Use as directed 1 kit 0  . brimonidine (ALPHAGAN) 0.2 % ophthalmic solution Place 1 drop into both eyes 3 (three) times daily.    . diclofenac sodium (VOLTAREN) 1 % GEL Apply 2 g topically 4 (four) times daily. 300 g 3  . dorzolamide-timolol (COSOPT) 22.3-6.8 MG/ML  ophthalmic solution Place 1 drop into both eyes 2 (two) times daily. 10 mL 3  . gabapentin (NEURONTIN) 300 MG capsule TAKE 1 CAPSULE (300 MG TOTAL) BY MOUTH AT BEDTIME. 60 capsule 3  . glucose blood (ACCU-CHEK AVIVA PLUS) test strip Use to check blood sugars twice a day. DX Code E11.8 100 each 12  . Insulin Glargine (BASAGLAR KWIKPEN) 100 UNIT/ML SOPN INJECT 0.25 MLS (25 UNITS TOTAL) INTO THE SKIN DAILY. 30 mL 6  . latanoprost (XALATAN) 0.005 % ophthalmic solution 1 drop at bedtime.    [place] lisinopril (ZESTRIL) 20 MG tablet Take 1 tablet (20 mg total) by mouth daily. 30 tablet 0  . metFORMIN (GLUCOPHAGE-XR) 500 MG 24 hr tablet Take 2 tablets (1,000 mg total) by mouth 2 (two) times daily with a meal. 60 tablet 0  . pantoprazole (PROTONIX) 20 MG tablet TAKE 1 TABLET (20 MG TOTAL) BY MOUTH DAILY. 30 tablet 1  . timolol (BETIMOL) 0.5 % ophthalmic solution Place 1 drop into both eyes at bedtime. 10 mL 3  . TRUEPLUS 5-BEVEL PEN NEEDLES 31G X 5 MM MISC USE AS DIRECTED 100 each 2   No facility-administered medications prior to visit.     ROS Review of Systems  Constitutional: Negative for activity change, appetite change and fatigue.  HENT: Negative for congestion, sinus pressure and sore throat.   Eyes: Negative for visual disturbance.  Respiratory: Negative for cough, chest tightness, shortness of breath  and wheezing.   Cardiovascular: Negative for chest pain and palpitations.  Gastrointestinal: Negative for abdominal distention, abdominal pain and constipation.  Endocrine: Negative for polydipsia.  Genitourinary: Negative for dysuria and frequency.  Musculoskeletal: Negative for arthralgias and back pain.  Skin: Negative for rash.  Neurological: Negative for tremors, light-headedness and numbness.  Hematological: Does not bruise/bleed easily.  Psychiatric/Behavioral: Negative for agitation and behavioral problems.    Objective:  BP (!) 159/88   Pulse 73   Ht '5\' 3"'$  (1.6 m)   Wt 147 lb 9.6  oz (67 kg)   SpO2 100%   BMI 26.15 kg/m   BP/Weight 12/21/2019 02/09/2019 76/81/1572  Systolic BP 620 355 974  Diastolic BP 88 72 80  Wt. (Lbs) 147.6 148 145.6  BMI 26.15 26.22 25.79      Physical Exam Constitutional:      Appearance: She is well-developed.  Neck:     Vascular: No JVD.  Cardiovascular:     Rate and Rhythm: Normal rate.     Heart sounds: Normal heart sounds. No murmur heard.   Pulmonary:     Effort: Pulmonary effort is normal.     Breath sounds: Normal breath sounds. No wheezing or rales.  Chest:     Chest wall: No tenderness.  Abdominal:     General: Bowel sounds are normal. There is no distension.     Palpations: Abdomen is soft. There is no mass.     Tenderness: There is no abdominal tenderness.  Musculoskeletal:        General: Normal range of motion.     Right lower leg: No edema.     Left lower leg: No edema.  Skin:    Comments: Thick dystrophic toenails in big toe bilaterally  Neurological:     Mental Status: She is alert and oriented to person, place, and time.  Psychiatric:        Mood and Affect: Mood normal.   Sensory exam of the foot is normal, tested with the monofilament. Good pulses, no lesions or ulcers, good peripheral pulses.   CMP Latest Ref Rng & Units 02/09/2019 10/29/2017 11/15/2016  Glucose 65 - 99 mg/dL 143(H) 203(H) 182(H)  BUN 8 - 27 mg/dL '21 15 18  '$ Creatinine 0.57 - 1.00 mg/dL 1.02(H) 0.94 0.99  Sodium 134 - 144 mmol/L 141 140 137  Potassium 3.5 - 5.2 mmol/L 4.5 4.3 4.0  Chloride 96 - 106 mmol/L 103 102 99  CO2 20 - 29 mmol/L '24 25 23  '$ Calcium 8.7 - 10.3 mg/dL 9.8 9.7 9.8  Total Protein 6.0 - 8.5 g/dL 6.7 6.8 6.8  Total Bilirubin 0.0 - 1.2 mg/dL 0.5 0.3 0.4  Alkaline Phos 39 - 117 IU/L 104 127(H) 106  AST 0 - 40 IU/L '17 18 15  '$ ALT 0 - 32 IU/L '13 12 11    '$ Lipid Panel     Component Value Date/Time   CHOL 180 02/09/2019 1204   TRIG 146 02/09/2019 1204   HDL 63 02/09/2019 1204   CHOLHDL 2.9 02/09/2019 1204   CHOLHDL  3.2 12/15/2013 1011   VLDL 26 12/15/2013 1011   LDLCALC 88 02/09/2019 1204    CBC    Component Value Date/Time   WBC 7.4 12/15/2013 1011   RBC 4.71 12/15/2013 1011   HGB 13.9 12/15/2013 1011   HCT 40.2 12/15/2013 1011   PLT 323 12/15/2013 1011   MCV 85.4 12/15/2013 1011   MCH 29.5 12/15/2013 1011   MCHC 34.6 12/15/2013 1011  RDW 12.7 12/15/2013 1011   LYMPHSABS 2.7 12/15/2013 1011   MONOABS 0.4 12/15/2013 1011   EOSABS 0.1 12/15/2013 1011   BASOSABS 0.0 12/15/2013 1011    Lab Results  Component Value Date   HGBA1C 8.4 (A) 12/21/2019    Assessment & Plan:   1. Diabetes mellitus type 2, uncontrolled, with complications ([place]) Uncontrolled with A1c of 8.4; goal is less than 7.5 Would love to decrease metformin dose and increase Lantus dose due to complaints of decreased appetite however she is opposed to this and would love to keep her regimen the same No increasing Lantus due to the fact that running out of her medications could have attributed to suboptimal glycemic control - POCT glucose (manual entry) - POCT glycosylated hemoglobin (Hb A1C) - lisinopril (ZESTRIL) 20 MG tablet; Take 1 tablet (20 mg total) by mouth daily.  Dispense: 90 tablet; Refill: 1 - metFORMIN (GLUCOPHAGE-XR) 500 MG 24 hr tablet; Take 2 tablets (1,000 mg total) by mouth daily with breakfast.  Dispense: 180 tablet; Refill: 1 - Insulin Glargine (BASAGLAR KWIKPEN) 100 UNIT/ML; Inject 0.25 mLs (25 Units total) into the skin daily.  Dispense: 30 mL; Refill: 6  2. Hyperlipidemia associated with type 2 diabetes mellitus (HCC) Stable Low-cholesterol diet - atorvastatin (LIPITOR) 40 MG tablet; Take 1 tablet (40 mg total) by mouth daily.  Dispense: 90 tablet; Refill: 1  3. Other diabetic neurological complication associated with type 2 diabetes mellitus ([facility]) Controlled - gabapentin (NEURONTIN) 300 MG capsule; Take 1 capsule (300 mg total) by mouth at bedtime.  Dispense: 90 capsule; Refill: 1  4. Essential  hypertension, benign Uncontrolled due to running out of antihypertensive No regimen change today - lisinopril (ZESTRIL) 20 MG tablet; Take 1 tablet (20 mg total) by mouth daily.  Dispense: 90 tablet; Refill: 1  5. Gastroesophageal reflux disease without esophagitis Stable - pantoprazole (PROTONIX) 20 MG tablet; Take 1 tablet (20 mg total) by mouth daily.  Dispense: 90 tablet; Refill: 1  6. Onychomycosis Discussed the use of oral antifungal's and adverse effects Patient's daughter would like to opt for topical treatment due to concerns over adverse effects. - ciclopirox (PENLAC) 8 % solution; Apply topically at bedtime. Apply over nail and surrounding skin. Apply daily over previous coat. After seven (7) days, may remove with alcohol and continue cycle.  Dispense: 6.6 mL; Refill: 0 - Ambulatory referral to Podiatry   Return in about 3 months (around 03/22/2020) for Chronic disease management.   Charlott Rakes, MD, FAAFP. [facility] and [facility], [place]   12/21/2019, 2:45 PM

## 2019-12-21 NOTE — Patient Instructions (Signed)
Fungal Nail Infection A fungal nail infection is a common infection of the toenails or fingernails. This condition affects toenails more often than fingernails. It often affects the great, or big, toes. More than one nail may be infected. The condition can be passed from person to person (is contagious). What are the causes? This condition is caused by a fungus. Several types of fungi can cause the infection. These fungi are common in moist and warm areas. If your hands or feet come into contact with the fungus, it may get into a crack in your fingernail or toenail and cause the infection. What increases the risk? The following factors may make you more likely to develop this condition:  Being female.  Being of older age.  Living with someone who has the fungus.  Walking barefoot in areas where the fungus thrives, such as showers or locker rooms.  Wearing shoes and socks that cause your feet to sweat.  Having a nail injury or a recent nail surgery.  Having certain medical conditions, such as: ? Athlete's foot. ? Diabetes. ? Psoriasis. ? Poor circulation. ? A weak body defense system (immune system). What are the signs or symptoms? Symptoms of this condition include:  A pale spot on the nail.  Thickening of the nail.  A nail that becomes yellow or brown.  A brittle or ragged nail edge.  A crumbling nail.  A nail that has lifted away from the nail bed. How is this diagnosed? This condition is diagnosed with a physical exam. Your health care provider may take a scraping or clipping from your nail to test for the fungus. How is this treated? Treatment is not needed for mild infections. If you have significant nail changes, treatment may include:  Antifungal medicines taken by mouth (orally). You may need to take the medicine for several weeks or several months, and you may not see the results for a long time. These medicines can cause side effects. Ask your health care provider  what problems to watch for.  Antifungal nail polish or nail cream. These may be used along with oral antifungal medicines.  Laser treatment of the nail.  Surgery to remove the nail. This may be needed for the most severe infections. It can take a long time, usually up to a year, for the infection to go away. The infection may also come back. Follow these instructions at home: Medicines  Take or apply over-the-counter and prescription medicines only as told by your health care provider.  Ask your health care provider about using over-the-counter mentholated ointment on your nails. Nail care  Trim your nails often.  Wash and dry your hands and feet every day.  Keep your feet dry: ? Wear absorbent socks, and change your socks frequently. ? Wear shoes that allow air to circulate, such as sandals or canvas tennis shoes. Throw out old shoes.  Do not use artificial nails.  If you go to a nail salon, make sure you choose one that uses clean instruments.  Use antifungal foot powder on your feet and in your shoes. General instructions  Do not share personal items, such as towels or nail clippers.  Do not walk barefoot in shower rooms or locker rooms.  Wear rubber gloves if you are working with your hands in wet areas.  Keep all follow-up visits as told by your health care provider. This is important. Contact a health care provider if: Your infection is not getting better or it is getting worse   after several months. Summary  A fungal nail infection is a common infection of the toenails or fingernails.  Treatment is not needed for mild infections. If you have significant nail changes, treatment may include taking medicine orally and applying medicine to your nails.  It can take a long time, usually up to a year, for the infection to go away. The infection may also come back.  Take or apply over-the-counter and prescription medicines only as told by your health care  provider.  Follow instructions for taking care of your nails to help prevent infection from coming back or spreading. This information is not intended to replace advice given to you by your health care provider. Make sure you discuss any questions you have with your health care provider. Document Revised: 10/08/2018 Document Reviewed: 11/21/2017 Elsevier Patient Education  2020 [employer]  

## 2020-01-11 DIAGNOSIS — Z23 Encounter for immunization: Secondary | ICD-10-CM | POA: Diagnosis not present

## 2020-01-14 ENCOUNTER — Ambulatory Visit (INDEPENDENT_AMBULATORY_CARE_PROVIDER_SITE_OTHER): Payer: Medicare Other | Admitting: Podiatry

## 2020-01-14 ENCOUNTER — Other Ambulatory Visit: Payer: Self-pay

## 2020-01-14 ENCOUNTER — Encounter: Payer: Self-pay | Admitting: Podiatry

## 2020-01-14 DIAGNOSIS — E1165 Type 2 diabetes mellitus with hyperglycemia: Secondary | ICD-10-CM | POA: Diagnosis not present

## 2020-01-14 DIAGNOSIS — M79675 Pain in left toe(s): Secondary | ICD-10-CM

## 2020-01-14 DIAGNOSIS — B351 Tinea unguium: Secondary | ICD-10-CM

## 2020-01-14 DIAGNOSIS — E118 Type 2 diabetes mellitus with unspecified complications: Secondary | ICD-10-CM

## 2020-01-14 DIAGNOSIS — H6120 Impacted cerumen, unspecified ear: Secondary | ICD-10-CM | POA: Insufficient documentation

## 2020-01-14 DIAGNOSIS — M79674 Pain in right toe(s): Secondary | ICD-10-CM

## 2020-01-14 DIAGNOSIS — R519 Headache, unspecified: Secondary | ICD-10-CM | POA: Insufficient documentation

## 2020-01-14 DIAGNOSIS — IMO0002 Reserved for concepts with insufficient information to code with codable children: Secondary | ICD-10-CM

## 2020-01-14 DIAGNOSIS — M21619 Bunion of unspecified foot: Secondary | ICD-10-CM | POA: Diagnosis not present

## 2020-01-14 MED FILL — LISINOPRIL 20 MG TABLET: 20 | 30 days supply | Qty: 30 | Fill #0

## 2020-01-14 MED FILL — LATANOPROST 0.005% EYE DRP: 0.005 | 56 days supply | Qty: 8 | Fill #1

## 2020-01-14 MED FILL — PANTOPRAZOLE SOD DR 20 MG T: 20 | 30 days supply | Qty: 30 | Fill #0

## 2020-01-14 MED FILL — TRUEPLUS 5-BEVEL PEN NEEDLE: 31G X 5 MM | 25 days supply | Qty: 100 | Fill #1

## 2020-01-14 MED FILL — ATORVASTATIN CALCIUM 40 MG: 40 | 30 days supply | Qty: 30 | Fill #0

## 2020-01-14 MED FILL — metFORMIN HCL ER 500 MG TB2: 500 | 90 days supply | Qty: 180 | Fill #0

## 2020-01-14 NOTE — Patient Instructions (Signed)
Diabetes Mellitus and Foot Care Foot care is an important part of your health, especially when you have diabetes. Diabetes may cause you to have problems because of poor blood flow (circulation) to your feet and legs, which can cause your skin to:  Become thinner and drier.  Break more easily.  Heal more slowly.  Peel and crack. You may also have nerve damage (neuropathy) in your legs and feet, causing decreased feeling in them. This means that you may not notice minor injuries to your feet that could lead to more serious problems. Noticing and addressing any potential problems early is the best way to prevent future foot problems. How to care for your feet Foot hygiene  Wash your feet daily with warm water and mild soap. Do not use hot water. Then, pat your feet and the areas between your toes until they are completely dry. Do not soak your feet as this can dry your skin.  Trim your toenails straight across. Do not dig under them or around the cuticle. File the edges of your nails with an emery board or nail file.  Apply a moisturizing lotion or petroleum jelly to the skin on your feet and to dry, brittle toenails. Use lotion that does not contain alcohol and is unscented. Do not apply lotion between your toes. Shoes and socks  Wear clean socks or stockings every day. Make sure they are not too tight. Do not wear knee-high stockings since they may decrease blood flow to your legs.  Wear shoes that fit properly and have enough cushioning. Always look in your shoes before you put them on to be sure there are no objects inside.  To break in new shoes, wear them for just a few hours a day. This prevents injuries on your feet. Wounds, scrapes, corns, and calluses  Check your feet daily for blisters, cuts, bruises, sores, and redness. If you cannot see the bottom of your feet, use a mirror or ask someone for help.  Do not cut corns or calluses or try to remove them with medicine.  If you  find a minor scrape, cut, or break in the skin on your feet, keep it and the skin around it clean and dry. You may clean these areas with mild soap and water. Do not clean the area with peroxide, alcohol, or iodine.  If you have a wound, scrape, corn, or callus on your foot, look at it several times a day to make sure it is healing and not infected. Check for: ? Redness, swelling, or pain. ? Fluid or blood. ? Warmth. ? Pus or a bad smell. General instructions  Do not cross your legs. This may decrease blood flow to your feet.  Do not use heating pads or hot water bottles on your feet. They may burn your skin. If you have lost feeling in your feet or legs, you may not know this is happening until it is too late.  Protect your feet from hot and cold by wearing shoes, such as at the beach or on hot pavement.  Schedule a complete foot exam at least once a year (annually) or more often if you have foot problems. If you have foot problems, report any cuts, sores, or bruises to your health care provider immediately. Contact a health care provider if:  You have a medical condition that increases your risk of infection and you have any cuts, sores, or bruises on your feet.  You have an injury that is not   healing.  You have redness on your legs or feet.  You feel burning or tingling in your legs or feet.  You have pain or cramps in your legs and feet.  Your legs or feet are numb.  Your feet always feel cold.  You have pain around a toenail. Get help right away if:  You have a wound, scrape, corn, or callus on your foot and: ? You have pain, swelling, or redness that gets worse. ? You have fluid or blood coming from the wound, scrape, corn, or callus. ? Your wound, scrape, corn, or callus feels warm to the touch. ? You have pus or a bad smell coming from the wound, scrape, corn, or callus. ? You have a fever. ? You have a red line going up your leg. Summary  Check your feet every day  for cuts, sores, red spots, swelling, and blisters.  Moisturize feet and legs daily.  Wear shoes that fit properly and have enough cushioning.  If you have foot problems, report any cuts, sores, or bruises to your health care provider immediately.  Schedule a complete foot exam at least once a year (annually) or more often if you have foot problems. This information is not intended to replace advice given to you by your health care provider. Make sure you discuss any questions you have with your health care provider. Document Revised: 03/10/2019 Document Reviewed: 07/19/2016 Elsevier Patient Education  2020 [employer]  

## 2020-01-14 NOTE — Progress Notes (Signed)
  Subjective:  Patient ID: [name], female    DOB: 02-Jun-1946,  MRN: [id number]  Chief Complaint  Patient presents with  . Diabetes    A1C  8.4, new patient diabetic foot care  . Nail Problem    thick painful toenails   74 y.o. female returns for the above complaint.  Patient presents with thickened elongated dystrophic toenails x10.  Patient states that they are painful to touch.  She is a diabetic with last A1c of 8.4.  Patient states that she is not able to debride down to self.  She would like for me to debride them down.  She has hard time ambulating because of how long they are.  She has secondary complaint of diabetic shoes and seeing if she qualifies for them as she is neuropathic and has toe deformities.  She denies any other acute complaints.  Objective:  There were no vitals filed for this visit. Podiatric Exam: Vascular: dorsalis pedis and posterior tibial pulses are palpable bilateral. Capillary return is immediate. Temperature gradient is WNL. Skin turgor WNL  Sensorium: Normal Semmes Weinstein monofilament test. Normal tactile sensation bilaterally. Nail Exam: Pt has thick disfigured discolored nails with subungual debris noted bilateral entire nail hallux through fifth toenails.  Pain on palpation to the nails. Ulcer Exam: There is no evidence of ulcer or pre-ulcerative changes or infection. Orthopedic Exam: Muscle tone and strength are WNL. No limitations in general ROM. No crepitus or effusions noted. HAV  B/L.  Hammer toes 2-5  B/L. Skin: No Porokeratosis. No infection or ulcers    Assessment & Plan:   1. Bunion   2. Pain due to onychomycosis of toenails of both feet   3. Diabetes mellitus type 2, uncontrolled, with complications ([place])     Patient was evaluated and treated and all questions answered.  Bilateral bunions with underlying hammertoe contractures 2 through 5 -I explained to the patient the etiology of bunion deformities and various treatment options  were extensively discussed.  Given that patient has contractures with pressure sensitive callus formation I believe she will benefit from diabetic shoes especially in the setting of neuropathy and uncontrolled A1c. -Should be scheduled with Liliane Channel to get diabetic shoes.  Onychomycosis with pain  -Nails palliatively debrided as below. -Educated on self-care  Procedure: Nail Debridement Rationale: pain  Type of Debridement: manual, sharp debridement. Instrumentation: Nail nipper, rotary burr. Number of Nails: 10  Procedures and Treatment: Consent by patient was obtained for treatment procedures. The patient understood the discussion of treatment and procedures well. All questions were answered thoroughly reviewed. Debridement of mycotic and hypertrophic toenails, 1 through 5 bilateral and clearing of subungual debris. No ulceration, no infection noted.  Return Visit-Office Procedure: Patient instructed to return to the office for a follow up visit 3 months for continued evaluation and treatment.  Boneta Lucks, DPM    Return in about 3 months (around 04/15/2020) for diabetic nail trim, Sched with Liliane Channel for Diabetic shoes/insoles.

## 2020-01-21 DIAGNOSIS — H1811 Bullous keratopathy, right eye: Secondary | ICD-10-CM | POA: Diagnosis not present

## 2020-01-21 DIAGNOSIS — H35032 Hypertensive retinopathy, left eye: Secondary | ICD-10-CM | POA: Diagnosis not present

## 2020-01-21 DIAGNOSIS — H43822 Vitreomacular adhesion, left eye: Secondary | ICD-10-CM | POA: Diagnosis not present

## 2020-01-21 DIAGNOSIS — E113312 Type 2 diabetes mellitus with moderate nonproliferative diabetic retinopathy with macular edema, left eye: Secondary | ICD-10-CM | POA: Diagnosis not present

## 2020-01-28 ENCOUNTER — Ambulatory Visit: Payer: Medicare Other | Admitting: Orthotics

## 2020-01-28 ENCOUNTER — Other Ambulatory Visit: Payer: Self-pay

## 2020-02-22 DIAGNOSIS — E113312 Type 2 diabetes mellitus with moderate nonproliferative diabetic retinopathy with macular edema, left eye: Secondary | ICD-10-CM | POA: Diagnosis not present

## 2020-03-03 MED FILL — LISINOPRIL 20 MG TABLET: 20 | 30 days supply | Qty: 30 | Fill #1

## 2020-03-03 MED FILL — PANTOPRAZOLE SOD DR 20 MG T: 20 | 30 days supply | Qty: 30 | Fill #1

## 2020-03-03 MED FILL — ATORVASTATIN CALCIUM 40 MG: 40 | 30 days supply | Qty: 30 | Fill #1

## 2020-03-03 MED FILL — BASAGLAR 100 UNIT/ML KWIKPE: 100 | 84 days supply | Qty: 21 | Fill #0

## 2020-03-20 ENCOUNTER — Ambulatory Visit (INDEPENDENT_AMBULATORY_CARE_PROVIDER_SITE_OTHER): Payer: Medicare Other | Admitting: Orthotics

## 2020-03-20 ENCOUNTER — Other Ambulatory Visit: Payer: Self-pay

## 2020-03-20 DIAGNOSIS — E1165 Type 2 diabetes mellitus with hyperglycemia: Secondary | ICD-10-CM

## 2020-03-20 DIAGNOSIS — M21619 Bunion of unspecified foot: Secondary | ICD-10-CM

## 2020-03-20 DIAGNOSIS — M21611 Bunion of right foot: Secondary | ICD-10-CM

## 2020-03-20 DIAGNOSIS — E118 Type 2 diabetes mellitus with unspecified complications: Secondary | ICD-10-CM

## 2020-03-20 DIAGNOSIS — IMO0002 Reserved for concepts with insufficient information to code with codable children: Secondary | ICD-10-CM

## 2020-03-20 DIAGNOSIS — M21612 Bunion of left foot: Secondary | ICD-10-CM | POA: Diagnosis not present

## 2020-03-20 NOTE — Progress Notes (Signed)

## 2020-03-21 DIAGNOSIS — H43822 Vitreomacular adhesion, left eye: Secondary | ICD-10-CM | POA: Diagnosis not present

## 2020-03-21 DIAGNOSIS — E113312 Type 2 diabetes mellitus with moderate nonproliferative diabetic retinopathy with macular edema, left eye: Secondary | ICD-10-CM | POA: Diagnosis not present

## 2020-03-21 DIAGNOSIS — H1811 Bullous keratopathy, right eye: Secondary | ICD-10-CM | POA: Diagnosis not present

## 2020-03-21 DIAGNOSIS — H35032 Hypertensive retinopathy, left eye: Secondary | ICD-10-CM | POA: Diagnosis not present

## 2020-04-13 MED FILL — GABAPENTIN 300 MG CAPSULE: 300 | 30 days supply | Qty: 30 | Fill #1

## 2020-04-13 MED FILL — TRUEPLUS 5-BEVEL PEN NEEDLE: 31G X 5 MM | 25 days supply | Qty: 100 | Fill #2

## 2020-04-13 MED FILL — DORZOLAMIDE-TIMOLOL EYE DRP: 22.3-6.8 | 90 days supply | Qty: 30 | Fill #1

## 2020-04-13 MED FILL — PANTOPRAZOLE SOD DR 20 MG T: 20 | 30 days supply | Qty: 30 | Fill #2

## 2020-04-13 MED FILL — LATANOPROST 0.005% EYE DRP: 0.005 | 56 days supply | Qty: 8 | Fill #2

## 2020-04-13 MED FILL — metFORMIN HCL ER 500 MG TB2: 500 | 90 days supply | Qty: 180 | Fill #1

## 2020-04-13 MED FILL — BRIMONIDINE 0.2% EYE DROP: 0.2 | 90 days supply | Qty: 30 | Fill #1

## 2020-04-13 MED FILL — LISINOPRIL 20 MG TABLET: 20 | 30 days supply | Qty: 30 | Fill #2

## 2020-04-13 MED FILL — ATORVASTATIN CALCIUM 40 MG: 40 | 30 days supply | Qty: 30 | Fill #2

## 2020-04-14 ENCOUNTER — Ambulatory Visit: Payer: Medicare Other | Admitting: Podiatry

## 2020-04-18 DIAGNOSIS — E113312 Type 2 diabetes mellitus with moderate nonproliferative diabetic retinopathy with macular edema, left eye: Secondary | ICD-10-CM | POA: Diagnosis not present

## 2020-05-12 ENCOUNTER — Ambulatory Visit (INDEPENDENT_AMBULATORY_CARE_PROVIDER_SITE_OTHER): Payer: Medicare Other | Admitting: Podiatry

## 2020-05-12 ENCOUNTER — Other Ambulatory Visit: Payer: Self-pay

## 2020-05-12 DIAGNOSIS — M79675 Pain in left toe(s): Secondary | ICD-10-CM

## 2020-05-12 DIAGNOSIS — E1165 Type 2 diabetes mellitus with hyperglycemia: Secondary | ICD-10-CM | POA: Diagnosis not present

## 2020-05-12 DIAGNOSIS — E118 Type 2 diabetes mellitus with unspecified complications: Secondary | ICD-10-CM

## 2020-05-12 DIAGNOSIS — M79674 Pain in right toe(s): Secondary | ICD-10-CM | POA: Diagnosis not present

## 2020-05-12 DIAGNOSIS — B351 Tinea unguium: Secondary | ICD-10-CM | POA: Diagnosis not present

## 2020-05-12 DIAGNOSIS — L603 Nail dystrophy: Secondary | ICD-10-CM | POA: Diagnosis not present

## 2020-05-12 DIAGNOSIS — IMO0002 Reserved for concepts with insufficient information to code with codable children: Secondary | ICD-10-CM

## 2020-05-16 ENCOUNTER — Encounter: Payer: Self-pay | Admitting: Podiatry

## 2020-05-16 DIAGNOSIS — H43822 Vitreomacular adhesion, left eye: Secondary | ICD-10-CM | POA: Diagnosis not present

## 2020-05-16 DIAGNOSIS — E113312 Type 2 diabetes mellitus with moderate nonproliferative diabetic retinopathy with macular edema, left eye: Secondary | ICD-10-CM | POA: Diagnosis not present

## 2020-05-16 DIAGNOSIS — H1811 Bullous keratopathy, right eye: Secondary | ICD-10-CM | POA: Diagnosis not present

## 2020-05-16 DIAGNOSIS — H35032 Hypertensive retinopathy, left eye: Secondary | ICD-10-CM | POA: Diagnosis not present

## 2020-05-16 NOTE — Progress Notes (Signed)
  Subjective:  Patient ID: [name], female    DOB: [birth year],  MRN: [id number]  Chief Complaint  Patient presents with  . routine foot care    nail trim    74 y.o. female returns for the above complaint.  Patient presents with thickened elongated dystrophic toenails x10.  Patient states that they are painful to touch.  She is a diabetic with last A1c of 8.4.  Patient states that she is not able to debride down to self.  She would like for me to debride them down.  She has hard time ambulating because of how long they are.  She has secondary complaint of nail fungus and would like to discuss treatment options for it.  She states that she has not tried anything for and to know if there is anything that is available for her.  She is here with her translator today  Objective:  There were no vitals filed for this visit. Podiatric Exam: Vascular: dorsalis pedis and posterior tibial pulses are palpable bilateral. Capillary return is immediate. Temperature gradient is WNL. Skin turgor WNL  Sensorium: Normal Semmes Weinstein monofilament test. Normal tactile sensation bilaterally. Nail Exam: Pt has thick disfigured discolored nails with subungual debris noted bilateral entire nail hallux through fifth toenails.  Pain on palpation to the nails. Ulcer Exam: There is no evidence of ulcer or pre-ulcerative changes or infection. Orthopedic Exam: Muscle tone and strength are WNL. No limitations in general ROM. No crepitus or effusions noted. HAV  B/L.  Hammer toes 2-5  B/L. Skin: No Porokeratosis. No infection or ulcers    Assessment & Plan:   1. Onychodystrophy   2. Diabetes mellitus type 2, uncontrolled, with complications ([place])   3. Pain due to onychomycosis of toenails of both feet     Patient was evaluated and treated and all questions answered.    Bilateral bunions with underlying hammertoe contractures 2 through 5 -I explained to the patient the etiology of bunion deformities and  various treatment options were extensively discussed.  Given that patient has contractures with pressure sensitive callus formation I believe she will benefit from diabetic shoes especially in the setting of neuropathy and uncontrolled A1c. -Diabetic shoes has been received has been functioning well.  Onychomycosis with pain with underlying onychodystrophy -Nails palliatively debrided as below. -Educated on self-care -Educated the patient on the etiology of onychomycosis and various treatment options associated with improving the fungal load.  I explained to the patient that there is 3 treatment options available to treat the onychomycosis including topical, p.o., laser treatment.  Patient elected to hold off on all option she just wanted to get educated on all the treatment options that are available.   Procedure: Nail Debridement Rationale: pain  Type of Debridement: manual, sharp debridement. Instrumentation: Nail nipper, rotary burr. Number of Nails: 10  Procedures and Treatment: Consent by patient was obtained for treatment procedures. The patient understood the discussion of treatment and procedures well. All questions were answered thoroughly reviewed. Debridement of mycotic and hypertrophic toenails, 1 through 5 bilateral and clearing of subungual debris. No ulceration, no infection noted.  Return Visit-Office Procedure: Patient instructed to return to the office for a follow up visit 3 months for continued evaluation and treatment.  Boneta Lucks, DPM    No follow-ups on file.

## 2020-06-06 MED FILL — BASAGLAR 100 UNIT/ML KWIKPE: 100 | 84 days supply | Qty: 21 | Fill #1

## 2020-06-07 ENCOUNTER — Other Ambulatory Visit: Payer: Self-pay

## 2020-06-07 ENCOUNTER — Other Ambulatory Visit: Payer: Self-pay | Admitting: Family Medicine

## 2020-06-07 ENCOUNTER — Ambulatory Visit: Payer: Medicare Other | Attending: Family Medicine | Admitting: Family Medicine

## 2020-06-07 ENCOUNTER — Encounter: Payer: Self-pay | Admitting: Family Medicine

## 2020-06-07 VITALS — BP 152/90 | HR 78 | Temp 98.0°F | Ht 63.0 in | Wt 150.4 lb

## 2020-06-07 DIAGNOSIS — E118 Type 2 diabetes mellitus with unspecified complications: Secondary | ICD-10-CM | POA: Diagnosis not present

## 2020-06-07 DIAGNOSIS — H6122 Impacted cerumen, left ear: Secondary | ICD-10-CM | POA: Diagnosis not present

## 2020-06-07 DIAGNOSIS — B373 Candidiasis of vulva and vagina: Secondary | ICD-10-CM | POA: Diagnosis not present

## 2020-06-07 DIAGNOSIS — E1165 Type 2 diabetes mellitus with hyperglycemia: Secondary | ICD-10-CM | POA: Diagnosis not present

## 2020-06-07 DIAGNOSIS — E1159 Type 2 diabetes mellitus with other circulatory complications: Secondary | ICD-10-CM | POA: Diagnosis not present

## 2020-06-07 DIAGNOSIS — E1149 Type 2 diabetes mellitus with other diabetic neurological complication: Secondary | ICD-10-CM | POA: Diagnosis not present

## 2020-06-07 DIAGNOSIS — K219 Gastro-esophageal reflux disease without esophagitis: Secondary | ICD-10-CM | POA: Diagnosis not present

## 2020-06-07 DIAGNOSIS — Z23 Encounter for immunization: Secondary | ICD-10-CM | POA: Diagnosis not present

## 2020-06-07 DIAGNOSIS — IMO0002 Reserved for concepts with insufficient information to code with codable children: Secondary | ICD-10-CM

## 2020-06-07 DIAGNOSIS — E785 Hyperlipidemia, unspecified: Secondary | ICD-10-CM | POA: Diagnosis not present

## 2020-06-07 DIAGNOSIS — B3731 Acute candidiasis of vulva and vagina: Secondary | ICD-10-CM

## 2020-06-07 DIAGNOSIS — I152 Hypertension secondary to endocrine disorders: Secondary | ICD-10-CM | POA: Diagnosis not present

## 2020-06-07 DIAGNOSIS — E1169 Type 2 diabetes mellitus with other specified complication: Secondary | ICD-10-CM

## 2020-06-07 LAB — POCT GLYCOSYLATED HEMOGLOBIN (HGB A1C): HbA1c, POC (controlled diabetic range): 9.9 % — AB (ref 0.0–7.0)

## 2020-06-07 LAB — GLUCOSE, POCT (MANUAL RESULT ENTRY): POC Glucose: 203 mg/dl — AB (ref 70–99)

## 2020-06-07 MED ORDER — PANTOPRAZOLE SODIUM 20 MG PO TBEC: 20.0000 mg | DELAYED_RELEASE_TABLET | Freq: Every day | ORAL | 1 refills | Status: DC

## 2020-06-07 MED ORDER — BASAGLAR KWIKPEN 100 UNIT/ML ~~LOC~~ SOPN: 30.0000 [IU] | PEN_INJECTOR | Freq: Every day | SUBCUTANEOUS | 6 refills | Status: DC

## 2020-06-07 MED ORDER — FLUCONAZOLE 150 MG PO TABS: 150.0000 mg | ORAL_TABLET | Freq: Once | ORAL | 0 refills | Status: DC

## 2020-06-07 MED ORDER — METFORMIN HCL ER 500 MG PO TB24: 1000.0000 mg | ORAL_TABLET | Freq: Every day | ORAL | 1 refills | Status: DC

## 2020-06-07 MED ORDER — ATORVASTATIN CALCIUM 40 MG PO TABS: 40.0000 mg | ORAL_TABLET | Freq: Every day | ORAL | 1 refills | Status: DC

## 2020-06-07 MED ORDER — LISINOPRIL 20 MG PO TABS: 20.0000 mg | ORAL_TABLET | Freq: Every day | ORAL | 1 refills | Status: DC

## 2020-06-07 MED ORDER — GABAPENTIN 300 MG PO CAPS: 300.0000 mg | ORAL_CAPSULE | Freq: Every day | ORAL | 1 refills | Status: DC

## 2020-06-07 MED FILL — GABAPENTIN 300 MG CAPSULE: 300 | 30 days supply | Qty: 30 | Fill #2

## 2020-06-07 MED FILL — LISINOPRIL 20 MG TABLET: 20 | 30 days supply | Qty: 30 | Fill #3

## 2020-06-07 MED FILL — ATORVASTATIN CALCIUM 40 MG: 40 | 30 days supply | Qty: 30 | Fill #3

## 2020-06-07 MED FILL — PANTOPRAZOLE SOD DR 20 MG T: 20 | 30 days supply | Qty: 30 | Fill #3

## 2020-06-07 MED FILL — FLUCONAZOLE 150 MG TABLET: 150 | 1 days supply | Qty: 1 | Fill #0

## 2020-06-07 NOTE — Progress Notes (Signed)
Subjective:  Patient ID: Tina Phelps, female    DOB: 04/30/1946  Age: 74 y.o. MRN: [id number]  CC: Ear Pain and Diabetes   HPI Tina Phelps is a37 year old female with a history of type 2 diabetes mellitus (A1c 8.4), hypertension, hyperlipidemia, osteoarthritis, GERD who presents today accompanied by her daughter for a follow-up visit. She has had L ear pressure and popping. She wakes up with stuffy nostril but denies headaches, fever.  Bp is elevated and she ran out of her antihypertensive Her A1c is 9.9 up from 8.4 previously.  She endorses compliance with her medications. Fasting sugars are around 190 Seeing Ophthalmology where she was diagnosed with diabetic retinopathy and is receiving injections in L eye for possibly vitreous hemorrhage. Past Medical History:  Diagnosis Date  . Arthritis   . Diabetes mellitus without complication ([place])   . Glaucoma   . Hypertension     Past Surgical History:  Procedure Laterality Date  . CHOLECYSTECTOMY    . NEPHRECTOMY Right   . OOPHORECTOMY Right     Family History  Problem Relation Age of Onset  . Diabetes Sister   . Diabetes Brother   . Diabetes Daughter     No Known Allergies  Outpatient Medications Prior to Visit  Medication Sig Dispense Refill  . ACCU-CHEK FASTCLIX LANCETS MISC Use as directed 100 each 3  . Blood Glucose Monitoring Suppl (ACCU-CHEK AVIVA PLUS) w/Device KIT Use as directed 1 kit 0  . brimonidine (ALPHAGAN) 0.2 % ophthalmic solution Place 1 drop into both eyes 3 (three) times daily.    . ciclopirox (PENLAC) 8 % solution Apply topically at bedtime. Apply over nail and surrounding skin. Apply daily over previous coat. After seven (7) days, may remove with alcohol and continue cycle. 6.6 mL 0  . diclofenac sodium (VOLTAREN) 1 % GEL Apply 2 g topically 4 (four) times daily. 300 g 3  . dorzolamide-timolol (COSOPT) 22.3-6.8 MG/ML ophthalmic solution Place 1 drop into both eyes 2 (two) times daily. 10 mL 3  .  glucose blood (ACCU-CHEK AVIVA PLUS) test strip Use to check blood sugars twice a day. DX Code E11.8 100 each 12  . latanoprost (XALATAN) 0.005 % ophthalmic solution 1 drop at bedtime.    Marland Kitchen MURO 128 5 % ophthalmic ointment SMARTSIG:1 Inch(es) In Eye(s) Every Evening    . timolol (BETIMOL) 0.5 % ophthalmic solution Place 1 drop into both eyes at bedtime. 10 mL 3  . timolol (TIMOPTIC) 0.5 % ophthalmic solution timolol maleate 0.5 % eye drops  1 DROP TO EFEECTED EYE(S) DAILY    . trimethoprim-polymyxin b (POLYTRIM) ophthalmic solution Polytrim 10,000 unit-1 mg/mL eye drops  INSTILL 1 DROP INTO AFFECTED EYE(S) BY OPHTHALMIC ROUTE EVERY 6 HOURS    . TRUEPLUS 5-BEVEL PEN NEEDLES 31G X 5 MM MISC USE AS DIRECTED 100 each 2  . atorvastatin (LIPITOR) 40 MG tablet Take 1 tablet (40 mg total) by mouth daily. 90 tablet 1  . gabapentin (NEURONTIN) 300 MG capsule Take 1 capsule (300 mg total) by mouth at bedtime. 90 capsule 1  . glimepiride (AMARYL) 4 MG tablet glimepiride 4 mg tablet    . HUMALOG KWIKPEN 100 UNIT/ML KwikPen SMARTSIG:30 Unit(s) IM Daily    . Insulin Glargine (BASAGLAR KWIKPEN) 100 UNIT/ML Inject 0.25 mLs (25 Units total) into the skin daily. 30 mL 6  . lisinopril (ZESTRIL) 20 MG tablet Take 1 tablet (20 mg total) by mouth daily. 90 tablet 1  . metFORMIN (GLUCOPHAGE-XR) 500 MG 24 hr  tablet Take 2 tablets (1,000 mg total) by mouth daily with breakfast. 180 tablet 1  . pantoprazole (PROTONIX) 20 MG tablet Take 1 tablet (20 mg total) by mouth daily. 90 tablet 1  . pravastatin (PRAVACHOL) 40 MG tablet pravastatin 40 mg tablet  Take 1 tablet every day by oral route for 90 days. (Patient not taking: Reported on 06/07/2020)     No facility-administered medications prior to visit.     ROS Review of Systems  Constitutional: Negative for activity change, appetite change and fatigue.  HENT: Negative for congestion, sinus pressure and sore throat.   Eyes: Negative for visual disturbance.   Respiratory: Negative for cough, chest tightness, shortness of breath and wheezing.   Cardiovascular: Negative for chest pain and palpitations.  Gastrointestinal: Negative for abdominal distention, abdominal pain and constipation.  Endocrine: Negative for polydipsia.  Genitourinary: Negative for dysuria and frequency.  Musculoskeletal: Negative for arthralgias and back pain.  Skin: Negative for rash.  Neurological: Negative for tremors, light-headedness and numbness.  Hematological: Does not bruise/bleed easily.  Psychiatric/Behavioral: Negative for agitation and behavioral problems.    Objective:  BP (!) 152/90   Pulse 78   Temp 98 F (36.7 C) (Oral)   Ht 5' 3" (1.6 m)   Wt 150 lb 6.4 oz (68.2 kg)   SpO2 100%   BMI 26.64 kg/m   BP/Weight 06/07/2020 12/21/2019 0/92/3300  Systolic BP 762 263 335  Diastolic BP 90 88 72  Wt. (Lbs) 150.4 147.6 148  BMI 26.64 26.15 26.22      Physical Exam Constitutional:      Appearance: She is well-developed.  HENT:     Right Ear: There is no impacted cerumen.     Left Ear: There is impacted cerumen.     Mouth/Throat:     Mouth: Mucous membranes are moist.  Neck:     Vascular: No JVD.  Cardiovascular:     Rate and Rhythm: Normal rate.     Heart sounds: Normal heart sounds. No murmur heard.   Pulmonary:     Effort: Pulmonary effort is normal.     Breath sounds: Normal breath sounds. No wheezing or rales.  Chest:     Chest wall: No tenderness.  Abdominal:     General: Bowel sounds are normal. There is no distension.     Palpations: Abdomen is soft. There is no mass.     Tenderness: There is no abdominal tenderness.  Musculoskeletal:        General: Normal range of motion.     Right lower leg: No edema.     Left lower leg: No edema.  Neurological:     Mental Status: She is alert and oriented to person, place, and time.  Psychiatric:        Mood and Affect: Mood normal.     CMP Latest Ref Rng & Units 02/09/2019 10/29/2017  11/15/2016  Glucose 65 - 99 mg/dL 143(H) 203(H) 182(H)  BUN 8 - 27 mg/dL _0 Creatinine 0.57 - 1.00 mg/dL 1.02(H) 0.94 0.99  Sodium 134 - 144 mmol/L 141 140 137  Potassium 3.5 - 5.2 mmol/L 4.5 4.3 4.0  Chloride 96 - 106 mmol/L 103 102 99  CO2 20 - 29 mmol/L _1 Calcium 8.7 - 10.3 mg/dL 9.8 9.7 9.8  Total Protein 6.0 - 8.5 g/dL 6.7 6.8 6.8  Total Bilirubin 0.0 - 1.2 mg/dL 0.5 0.3 0.4  Alkaline Phos 39 - 117 IU/L 104 127(H) 106  AST 0 - 40 IU/L _0 ALT 0 - 32 IU/L _1 Lipid Panel     Component Value Date/Time   CHOL 180 02/09/2019 1204   TRIG 146 02/09/2019 1204   HDL 63 02/09/2019 1204   CHOLHDL 2.9 02/09/2019 1204   CHOLHDL 3.2 12/15/2013 1011   VLDL 26 12/15/2013 1011   LDLCALC 88 02/09/2019 1204    CBC    Component Value Date/Time   WBC 7.4 12/15/2013 1011   RBC 4.71 12/15/2013 1011   HGB 13.9 12/15/2013 1011   HCT 40.2 12/15/2013 1011   PLT 323 12/15/2013 1011   MCV 85.4 12/15/2013 1011   MCH 29.5 12/15/2013 1011   MCHC 34.6 12/15/2013 1011   RDW 12.7 12/15/2013 1011   LYMPHSABS 2.7 12/15/2013 1011   MONOABS 0.4 12/15/2013 1011   EOSABS 0.1 12/15/2013 1011   BASOSABS 0.0 12/15/2013 1011    Lab Results  Component Value Date   HGBA1C 9.9 (A) 06/07/2020    Assessment & Plan:  1. Diabetes mellitus type 2, uncontrolled, with complications ([place]) Uncontrolled with A1c of 9.9 which is up from 8.4 previously Goal is less than 7.5 Increase Lantus dose from 25 units to 30 units. Counseled on Diabetic diet, my plate method, 230 minutes of moderate intensity exercise/week Blood sugar logs with fasting goals of 80-120 mg/dl, random of less than 180 and in the event of sugars less than 60 mg/dl or greater than 400 mg/dl encouraged to notify the clinic. Advised on the need for annual eye exams, annual foot exams, Pneumonia vaccine. - POCT glucose (manual entry) - POCT glycosylated hemoglobin (Hb A1C) - Insulin Glargine (BASAGLAR KWIKPEN) 100  UNIT/ML; Inject 30 Units into the skin daily.  Dispense: 30 mL; Refill: 6 - lisinopril (ZESTRIL) 20 MG tablet; Take 1 tablet (20 mg total) by mouth daily.  Dispense: 90 tablet; Refill: 1 - metFORMIN (GLUCOPHAGE-XR) 500 MG 24 hr tablet; Take 2 tablets (1,000 mg total) by mouth daily with breakfast.  Dispense: 180 tablet; Refill: 1 - CMP14+EGFR - Microalbumin / creatinine urine ratio - Pneumococcal conjugate vaccine 13-valent IM  2. Hypertension associated with diabetes ([place]) Uncontrolled due to running out of medications which I have refilled Counseled on blood pressure goal of less than 130/80, low-sodium, DASH diet, medication compliance, 150 minutes of moderate intensity exercise per week. Discussed medication compliance, adverse effects. - lisinopril (ZESTRIL) 20 MG tablet; Take 1 tablet (20 mg total) by mouth daily.  Dispense: 90 tablet; Refill: 1  3. Gastroesophageal reflux disease without esophagitis Controlled - pantoprazole (PROTONIX) 20 MG tablet; Take 1 tablet (20 mg total) by mouth daily.  Dispense: 90 tablet; Refill: 1  4. Other diabetic neurological complication associated with type 2 diabetes mellitus (HCC) Stable - gabapentin (NEURONTIN) 300 MG capsule; Take 1 capsule (300 mg total) by mouth at bedtime.  Dispense: 90 capsule; Refill: 1  5. Hyperlipidemia associated with type 2 diabetes mellitus (HCC) Controlled Low-cholesterol diet - atorvastatin (LIPITOR) 40 MG tablet; Take 1 tablet (40 mg total) by mouth daily.  Dispense: 90 tablet; Refill: 1  6. Vaginal candidiasis Likely due to hyperglycemia - fluconazole (DIFLUCAN) 150 MG tablet; Take 1 tablet (150 mg total) by mouth once for 1 dose.  Dispense: 1 tablet; Refill: 0  7. Impacted cerumen of left ear Ear irrigation performed in clinic This could explain her ear symptoms    Meds ordered this encounter  Medications  . Insulin Glargine (BASAGLAR KWIKPEN) 100 UNIT/ML  Sig: Inject 30 Units into the skin daily.     Dispense:  30 mL    Refill:  6    Dose increase  . lisinopril (ZESTRIL) 20 MG tablet    Sig: Take 1 tablet (20 mg total) by mouth daily.    Dispense:  90 tablet    Refill:  1  . fluconazole (DIFLUCAN) 150 MG tablet    Sig: Take 1 tablet (150 mg total) by mouth once for 1 dose.    Dispense:  1 tablet    Refill:  0  . pantoprazole (PROTONIX) 20 MG tablet    Sig: Take 1 tablet (20 mg total) by mouth daily.    Dispense:  90 tablet    Refill:  1  . gabapentin (NEURONTIN) 300 MG capsule    Sig: Take 1 capsule (300 mg total) by mouth at bedtime.    Dispense:  90 capsule    Refill:  1  . metFORMIN (GLUCOPHAGE-XR) 500 MG 24 hr tablet    Sig: Take 2 tablets (1,000 mg total) by mouth daily with breakfast.    Dispense:  180 tablet    Refill:  1  . atorvastatin (LIPITOR) 40 MG tablet    Sig: Take 1 tablet (40 mg total) by mouth daily.    Dispense:  90 tablet    Refill:  1    Follow-up: Return in about 3 months (around 09/05/2020) for Chronic disease management.       Charlott Rakes, MD, FAAFP. [facility] [place], [place]   06/07/2020, 3:12 PM

## 2020-06-07 NOTE — Progress Notes (Signed)
Having pain in left ear for 1 week.

## 2020-06-07 NOTE — Patient Instructions (Signed)
Earwax Buildup, Adult The ears produce a substance called earwax that helps keep bacteria out of the ear and protects the skin in the ear canal. Occasionally, earwax can build up in the ear and cause discomfort or hearing loss. What increases the risk? This condition is more likely to develop in people who:  Are female.  Are elderly.  Naturally produce more earwax.  Clean their ears often with cotton swabs.  Use earplugs often.  Use in-ear headphones often.  Wear hearing aids.  Have narrow ear canals.  Have earwax that is overly thick or sticky.  Have eczema.  Are dehydrated.  Have excess hair in the ear canal. What are the signs or symptoms? Symptoms of this condition include:  Reduced or muffled hearing.  A feeling of fullness in the ear or feeling that the ear is plugged.  Fluid coming from the ear.  Ear pain.  Ear itch.  Ringing in the ear.  Coughing.  An obvious piece of earwax that can be seen inside the ear canal. How is this diagnosed? This condition may be diagnosed based on:  Your symptoms.  Your medical history.  An ear exam. During the exam, your health care provider will look into your ear with an instrument called an otoscope. You may have tests, including a hearing test. How is this treated? This condition may be treated by:  Using ear drops to soften the earwax.  Having the earwax removed by a health care provider. The health care provider may: ? Flush the ear with water. ? Use an instrument that has a loop on the end (curette). ? Use a suction device.  Surgery to remove the wax buildup. This may be done in severe cases. Follow these instructions at home:   Take over-the-counter and prescription medicines only as told by your health care provider.  Do not put any objects, including cotton swabs, into your ear. You can clean the opening of your ear canal with a washcloth or facial tissue.  Follow instructions from your health care  provider about cleaning your ears. Do not over-clean your ears.  Drink enough fluid to keep your urine clear or pale yellow. This will help to thin the earwax.  Keep all follow-up visits as told by your health care provider. If earwax builds up in your ears often or if you use hearing aids, consider seeing your health care provider for routine, preventive ear cleanings. Ask your health care provider how often you should schedule your cleanings.  If you have hearing aids, clean them according to instructions from the manufacturer and your health care provider. Contact a health care provider if:  You have ear pain.  You develop a fever.  You have blood, pus, or other fluid coming from your ear.  You have hearing loss.  You have ringing in your ears that does not go away.  Your symptoms do not improve with treatment.  You feel like the room is spinning (vertigo). Summary  Earwax can build up in the ear and cause discomfort or hearing loss.  The most common symptoms of this condition include reduced or muffled hearing and a feeling of fullness in the ear or feeling that the ear is plugged.  This condition may be diagnosed based on your symptoms, your medical history, and an ear exam.  This condition may be treated by using ear drops to soften the earwax or by having the earwax removed by a health care provider.  Do not put any   objects, including cotton swabs, into your ear. You can clean the opening of your ear canal with a washcloth or facial tissue. This information is not intended to replace advice given to you by your health care provider. Make sure you discuss any questions you have with your health care provider. Document Revised: 05/30/2017 Document Reviewed: 08/28/2016 Elsevier Patient Education  2020 Elsevier Inc.  

## 2020-06-08 LAB — MICROALBUMIN / CREATININE URINE RATIO
Creatinine, Urine: 131.7 mg/dL
Microalb/Creat Ratio: 18 mg/g creat (ref 0–29)
Microalbumin, Urine: 24.3 ug/mL

## 2020-06-08 LAB — CMP14+EGFR
ALT: 17 IU/L (ref 0–32)
AST: 17 IU/L (ref 0–40)
Albumin/Globulin Ratio: 1.7 (ref 1.2–2.2)
Albumin: 4.5 g/dL (ref 3.7–4.7)
Alkaline Phosphatase: 113 IU/L (ref 44–121)
BUN/Creatinine Ratio: 17 (ref 12–28)
BUN: 16 mg/dL (ref 8–27)
Bilirubin Total: 0.4 mg/dL (ref 0.0–1.2)
CO2: 24 mmol/L (ref 20–29)
Calcium: 10 mg/dL (ref 8.7–10.3)
Chloride: 100 mmol/L (ref 96–106)
Creatinine, Ser: 0.96 mg/dL (ref 0.57–1.00)
GFR calc Af Amer: 68 mL/min/{1.73_m2} (ref >59)
GFR calc non Af Amer: 59 mL/min/{1.73_m2} — ABNORMAL LOW (ref >59)
Globulin, Total: 2.7 g/dL (ref 1.5–4.5)
Glucose: 221 mg/dL — ABNORMAL HIGH (ref 65–99)
Potassium: 4.6 mmol/L (ref 3.5–5.2)
Sodium: 139 mmol/L (ref 134–144)
Total Protein: 7.2 g/dL (ref 6.0–8.5)

## 2020-06-13 DIAGNOSIS — E113312 Type 2 diabetes mellitus with moderate nonproliferative diabetic retinopathy with macular edema, left eye: Secondary | ICD-10-CM | POA: Diagnosis not present

## 2020-07-24 DIAGNOSIS — H43822 Vitreomacular adhesion, left eye: Secondary | ICD-10-CM | POA: Diagnosis not present

## 2020-07-24 DIAGNOSIS — H35032 Hypertensive retinopathy, left eye: Secondary | ICD-10-CM | POA: Diagnosis not present

## 2020-07-24 DIAGNOSIS — H1811 Bullous keratopathy, right eye: Secondary | ICD-10-CM | POA: Diagnosis not present

## 2020-07-24 DIAGNOSIS — E113312 Type 2 diabetes mellitus with moderate nonproliferative diabetic retinopathy with macular edema, left eye: Secondary | ICD-10-CM | POA: Diagnosis not present

## 2020-08-04 MED FILL — PANTOPRAZOLE SOD DR 20 MG T: 20 | 30 days supply | Qty: 30 | Fill #4

## 2020-08-04 MED FILL — GABAPENTIN 300 MG CAPSULE: 300 | 30 days supply | Qty: 30 | Fill #3

## 2020-08-04 MED FILL — LISINOPRIL 20 MG TABLET: 20 | 30 days supply | Qty: 30 | Fill #4

## 2020-08-04 MED FILL — metFORMIN HCL ER 500 MG TB2: 500 | 90 days supply | Qty: 180 | Fill #0

## 2020-08-04 MED FILL — ATORVASTATIN CALCIUM 40 MG: 40 | 30 days supply | Qty: 30 | Fill #4

## 2020-08-04 MED FILL — DORZOLAMIDE-TIMOLOL EYE DRP: 22.3-6.8 | 90 days supply | Qty: 30 | Fill #2

## 2020-08-04 MED FILL — LATANOPROST 0.005% EYE DRP: 0.005 | 56 days supply | Qty: 8 | Fill #3

## 2020-08-04 MED FILL — BRIMONIDINE 0.2% EYE DROP: 0.2 | 90 days supply | Qty: 30 | Fill #2

## 2020-08-18 ENCOUNTER — Ambulatory Visit: Payer: Medicare Other | Admitting: Podiatry

## 2020-08-20 DIAGNOSIS — Z23 Encounter for immunization: Secondary | ICD-10-CM | POA: Diagnosis not present

## 2020-08-21 DIAGNOSIS — E113312 Type 2 diabetes mellitus with moderate nonproliferative diabetic retinopathy with macular edema, left eye: Secondary | ICD-10-CM | POA: Diagnosis not present

## 2020-08-28 ENCOUNTER — Other Ambulatory Visit: Payer: Self-pay | Admitting: Ophthalmology

## 2020-08-28 DIAGNOSIS — E113312 Type 2 diabetes mellitus with moderate nonproliferative diabetic retinopathy with macular edema, left eye: Secondary | ICD-10-CM | POA: Diagnosis not present

## 2020-08-28 DIAGNOSIS — Z794 Long term (current) use of insulin: Secondary | ICD-10-CM | POA: Diagnosis not present

## 2020-08-28 DIAGNOSIS — H401132 Primary open-angle glaucoma, bilateral, moderate stage: Secondary | ICD-10-CM | POA: Diagnosis not present

## 2020-08-31 ENCOUNTER — Other Ambulatory Visit: Payer: Self-pay | Admitting: Family Medicine

## 2020-08-31 DIAGNOSIS — IMO0002 Reserved for concepts with insufficient information to code with codable children: Secondary | ICD-10-CM

## 2020-08-31 DIAGNOSIS — E1165 Type 2 diabetes mellitus with hyperglycemia: Secondary | ICD-10-CM

## 2020-08-31 MED FILL — PANTOPRAZOLE SOD DR 20 MG T: 20 | 90 days supply | Qty: 90 | Fill #0

## 2020-08-31 MED FILL — GABAPENTIN 300 MG CAPSULE: 300 | 90 days supply | Qty: 90 | Fill #0

## 2020-08-31 MED FILL — LISINOPRIL 20 MG TABLET: 20 | 90 days supply | Qty: 90 | Fill #0

## 2020-08-31 MED FILL — ATORVASTATIN CALCIUM 40 MG: 40 | 90 days supply | Qty: 90 | Fill #0

## 2020-08-31 MED FILL — BASAGLAR 100 UNIT/ML KWIKPE: 100 | 90 days supply | Qty: 27 | Fill #0

## 2020-09-11 ENCOUNTER — Other Ambulatory Visit: Payer: Self-pay | Admitting: Family Medicine

## 2020-09-11 DIAGNOSIS — IMO0002 Reserved for concepts with insufficient information to code with codable children: Secondary | ICD-10-CM

## 2020-09-11 DIAGNOSIS — E1165 Type 2 diabetes mellitus with hyperglycemia: Secondary | ICD-10-CM

## 2020-09-30 ENCOUNTER — Other Ambulatory Visit: Payer: Self-pay

## 2020-12-15 ENCOUNTER — Other Ambulatory Visit: Payer: Self-pay

## 2020-12-15 MED FILL — Metformin HCl Tab ER 24HR 500 MG: ORAL | 90 days supply | Qty: 180 | Fill #0 | Status: AC

## 2020-12-15 MED FILL — Pantoprazole Sodium EC Tab 20 MG (Base Equiv): ORAL | 90 days supply | Qty: 90 | Fill #0 | Status: AC

## 2020-12-15 MED FILL — Insulin Glargine Soln Pen-Injector 100 Unit/ML: SUBCUTANEOUS | 90 days supply | Qty: 27 | Fill #0 | Status: AC

## 2020-12-15 MED FILL — Atorvastatin Calcium Tab 40 MG (Base Equivalent): ORAL | 90 days supply | Qty: 90 | Fill #0 | Status: AC

## 2020-12-15 MED FILL — Lisinopril Tab 20 MG: ORAL | 90 days supply | Qty: 90 | Fill #0 | Status: AC

## 2020-12-15 MED FILL — Gabapentin Cap 300 MG: ORAL | 90 days supply | Qty: 90 | Fill #0 | Status: AC

## 2020-12-15 MED FILL — Latanoprost Ophth Soln 0.005%: OPHTHALMIC | 75 days supply | Qty: 7.5 | Fill #0 | Status: AC

## 2020-12-15 MED FILL — Dorzolamide HCl-Timolol Maleate Ophth Soln 2-0.5%: OPHTHALMIC | 75 days supply | Qty: 10 | Fill #0 | Status: AC

## 2020-12-15 MED FILL — Brimonidine Tartrate Ophth Soln 0.2%: OPHTHALMIC | 75 days supply | Qty: 10 | Fill #0 | Status: AC

## 2020-12-18 ENCOUNTER — Other Ambulatory Visit: Payer: Self-pay

## 2020-12-18 MED FILL — Insulin Pen Needle 31 G X 5 MM (1/5" or 3/16"): 100 days supply | Qty: 100 | Fill #0 | Status: CN

## 2020-12-18 MED FILL — Insulin Pen Needle 31 G X 5 MM (1/5" or 3/16"): 30 days supply | Qty: 100 | Fill #0 | Status: CN

## 2020-12-19 ENCOUNTER — Other Ambulatory Visit: Payer: Self-pay

## 2020-12-26 ENCOUNTER — Other Ambulatory Visit: Payer: Self-pay

## 2021-02-05 ENCOUNTER — Other Ambulatory Visit: Payer: Self-pay

## 2021-02-05 MED ORDER — AMOXICILLIN 500 MG PO CAPS
ORAL_CAPSULE | ORAL | 0 refills | Status: DC
  Filled 2021-02-05: qty 21, 7d supply, fill #0

## 2021-02-13 DIAGNOSIS — H1811 Bullous keratopathy, right eye: Secondary | ICD-10-CM | POA: Diagnosis not present

## 2021-02-13 DIAGNOSIS — H35032 Hypertensive retinopathy, left eye: Secondary | ICD-10-CM | POA: Diagnosis not present

## 2021-02-13 DIAGNOSIS — E113312 Type 2 diabetes mellitus with moderate nonproliferative diabetic retinopathy with macular edema, left eye: Secondary | ICD-10-CM | POA: Diagnosis not present

## 2021-02-13 DIAGNOSIS — H43822 Vitreomacular adhesion, left eye: Secondary | ICD-10-CM | POA: Diagnosis not present

## 2021-02-15 ENCOUNTER — Ambulatory Visit: Payer: Medicare Other | Admitting: Physician Assistant

## 2021-03-01 ENCOUNTER — Other Ambulatory Visit: Payer: Self-pay

## 2021-03-01 ENCOUNTER — Ambulatory Visit: Payer: Medicare Other | Attending: Physician Assistant | Admitting: Physician Assistant

## 2021-03-01 DIAGNOSIS — E1169 Type 2 diabetes mellitus with other specified complication: Secondary | ICD-10-CM | POA: Diagnosis not present

## 2021-03-01 DIAGNOSIS — I152 Hypertension secondary to endocrine disorders: Secondary | ICD-10-CM

## 2021-03-01 DIAGNOSIS — E1149 Type 2 diabetes mellitus with other diabetic neurological complication: Secondary | ICD-10-CM

## 2021-03-01 DIAGNOSIS — Z7984 Long term (current) use of oral hypoglycemic drugs: Secondary | ICD-10-CM

## 2021-03-01 DIAGNOSIS — E785 Hyperlipidemia, unspecified: Secondary | ICD-10-CM

## 2021-03-01 DIAGNOSIS — I10 Essential (primary) hypertension: Secondary | ICD-10-CM

## 2021-03-01 DIAGNOSIS — E118 Type 2 diabetes mellitus with unspecified complications: Secondary | ICD-10-CM

## 2021-03-01 DIAGNOSIS — IMO0002 Reserved for concepts with insufficient information to code with codable children: Secondary | ICD-10-CM

## 2021-03-01 DIAGNOSIS — E1165 Type 2 diabetes mellitus with hyperglycemia: Secondary | ICD-10-CM

## 2021-03-01 DIAGNOSIS — E1159 Type 2 diabetes mellitus with other circulatory complications: Secondary | ICD-10-CM

## 2021-03-01 DIAGNOSIS — K219 Gastro-esophageal reflux disease without esophagitis: Secondary | ICD-10-CM

## 2021-03-01 MED ORDER — BASAGLAR KWIKPEN 100 UNIT/ML ~~LOC~~ SOPN
30.0000 [IU] | PEN_INJECTOR | Freq: Every day | SUBCUTANEOUS | 6 refills | Status: DC
  Filled 2021-03-01: qty 27, 90d supply, fill #0

## 2021-03-01 MED ORDER — ACCU-CHEK SOFTCLIX LANCETS MISC
12 refills | Status: DC
  Filled 2021-03-01: qty 100, fill #0

## 2021-03-01 MED ORDER — ACCU-CHEK AVIVA PLUS W/DEVICE KIT
PACK | 0 refills | Status: DC
  Filled 2021-03-01: qty 1, fill #0

## 2021-03-01 MED ORDER — METFORMIN HCL ER 500 MG PO TB24
ORAL_TABLET | Freq: Every day | ORAL | 1 refills | Status: DC
Start: 2021-03-01 — End: 2021-06-05
  Filled 2021-03-01: qty 180, 90d supply, fill #0

## 2021-03-01 MED ORDER — GABAPENTIN 300 MG PO CAPS
ORAL_CAPSULE | Freq: Every day | ORAL | 1 refills | Status: DC
  Filled 2021-03-01: qty 90, 90d supply, fill #0

## 2021-03-01 MED ORDER — PANTOPRAZOLE SODIUM 20 MG PO TBEC
DELAYED_RELEASE_TABLET | Freq: Every day | ORAL | 1 refills | Status: DC
  Filled 2021-03-01: qty 90, 90d supply, fill #0

## 2021-03-01 MED ORDER — BD PEN NEEDLE MINI U/F 31G X 5 MM MISC
1 refills | Status: DC
  Filled 2021-03-01: qty 100, 25d supply, fill #0

## 2021-03-01 MED ORDER — ATORVASTATIN CALCIUM 40 MG PO TABS
ORAL_TABLET | Freq: Every day | ORAL | 1 refills | Status: DC
  Filled 2021-03-01: qty 90, 90d supply, fill #0

## 2021-03-01 MED ORDER — LISINOPRIL 20 MG PO TABS
ORAL_TABLET | Freq: Every day | ORAL | 1 refills | Status: DC
  Filled 2021-03-01: qty 90, 90d supply, fill #0

## 2021-03-01 MED ORDER — ACCU-CHEK AVIVA PLUS VI STRP
ORAL_STRIP | 12 refills | Status: DC
  Filled 2021-03-01: qty 100, 25d supply, fill #0

## 2021-03-01 NOTE — Progress Notes (Signed)
Patient ID: Tina Phelps, female   DOB: December 30, 1945, 75 y.o.   MRN: [id number] Virtual Visit via Telephone Note  I connected with Tina Phelps on 03/01/21 at  8:30 AM EDT by telephone and verified that I am speaking with the correct person using two identifiers.  Location: Patient: home Provider: [facility] office Daughter Tina Phelps is interpreting   I discussed the limitations, risks, security and privacy concerns of performing an evaluation and management service by telephone and the availability of in person appointments. I also discussed with the patient that there may be a patient responsible charge related to this service. The patient expressed understanding and agreed to proceed.   History of Present Illness:  [place] and has lost her glucometer.  She is compliant with meds.  No new issues or concerns today.  She will come for labs today.      Observations/Objective:  NAD.  A&Ox3   Assessment and Plan: 1. Diabetes mellitus type 2, uncontrolled, with complications (HCC) Will assess control and make changes as needed when labs are back - Hemoglobin A1c - gabapentin (NEURONTIN) 300 MG capsule; TAKE 1 CAPSULE (300 MG TOTAL) BY MOUTH AT BEDTIME.  Dispense: 90 capsule; Refill: 1 - Insulin Glargine (BASAGLAR KWIKPEN) 100 UNIT/ML; INJECT 30 UNITS INTO THE SKIN DAILY.  Dispense: 30 mL; Refill: 6 - lisinopril (ZESTRIL) 20 MG tablet; TAKE 1 TABLET (20 MG TOTAL) BY MOUTH DAILY.  Dispense: 90 tablet; Refill: 1 - metFORMIN (GLUCOPHAGE-XR) 500 MG 24 hr tablet; TAKE 2 TABLETS (1,000 MG TOTAL) BY MOUTH DAILY WITH BREAKFAST.  Dispense: 180 tablet; Refill: 1 - glucose blood (ACCU-CHEK AVIVA PLUS) test strip; Use to check blood sugars twice a day. DX Code E11.8  Dispense: 100 each; Refill: 12 - Accu-Chek Softclix Lancets lancets; Use as instructed  Dispense: 100 each; Refill: 12  2. Essential hypertension, benign RF lisinopril 20 - Hemoglobin A1c - Comprehensive metabolic panel -  CBC with Differential/Platelet  3. Hyperlipidemia associated with type 2 diabetes mellitus (HCC) - Lipid panel - CBC with Differential/Platelet - atorvastatin (LIPITOR) 40 MG tablet; TAKE 1 TABLET (40 MG TOTAL) BY MOUTH DAILY.  Dispense: 90 tablet; Refill: 1  4. Other diabetic neurological complication associated with type 2 diabetes mellitus (HCC) - gabapentin (NEURONTIN) 300 MG capsule; TAKE 1 CAPSULE (300 MG TOTAL) BY MOUTH AT BEDTIME.  Dispense: 90 capsule; Refill: 1  5. Uncontrolled type 2 diabetes mellitus with complication, without long-term current use of insulin (HCC) - Insulin Pen Needle (B-D UF III MINI PEN NEEDLES) 31G X 5 MM MISC; USE AS DIRECTED  Dispense: 100 each; Refill: 1  6. Hypertension associated with diabetes ([place]) - lisinopril (ZESTRIL) 20 MG tablet; TAKE 1 TABLET (20 MG TOTAL) BY MOUTH DAILY.  Dispense: 90 tablet; Refill: 1  7. Gastroesophageal reflux disease without esophagitis - pantoprazole (PROTONIX) 20 MG tablet; TAKE 1 TABLET (20 MG TOTAL) BY MOUTH DAILY.  Dispense: 90 tablet; Refill: 1    Follow Up Instructions: See PCP in 3-4 months   I discussed the assessment and treatment plan with the patient. The patient was provided an opportunity to ask questions and all were answered. The patient agreed with the plan and demonstrated an understanding of the instructions.   The patient was advised to call back or seek an in-person evaluation if the symptoms worsen or if the condition fails to improve as anticipated.  I provided 12 minutes of non-face-to-face time during this encounter.   Freeman Caldron, PA-C

## 2021-03-02 LAB — COMPREHENSIVE METABOLIC PANEL
ALT: 10 IU/L (ref 0–32)
AST: 16 IU/L (ref 0–40)
Albumin/Globulin Ratio: 1.6 (ref 1.2–2.2)
Albumin: 4.2 g/dL (ref 3.7–4.7)
Alkaline Phosphatase: 103 IU/L (ref 44–121)
BUN/Creatinine Ratio: 17 (ref 12–28)
BUN: 18 mg/dL (ref 8–27)
Bilirubin Total: 0.4 mg/dL (ref 0.0–1.2)
CO2: 23 mmol/L (ref 20–29)
Calcium: 9.6 mg/dL (ref 8.7–10.3)
Chloride: 101 mmol/L (ref 96–106)
Creatinine, Ser: 1.03 mg/dL — ABNORMAL HIGH (ref 0.57–1.00)
Globulin, Total: 2.7 g/dL (ref 1.5–4.5)
Glucose: 107 mg/dL — ABNORMAL HIGH (ref 65–99)
Potassium: 4.4 mmol/L (ref 3.5–5.2)
Sodium: 139 mmol/L (ref 134–144)
Total Protein: 6.9 g/dL (ref 6.0–8.5)
eGFR: 57 mL/min/{1.73_m2} — ABNORMAL LOW (ref >59)

## 2021-03-02 LAB — CBC WITH DIFFERENTIAL/PLATELET
Basophils Absolute: 0.1 10*3/uL (ref 0.0–0.2)
Basos: 1 %
EOS (ABSOLUTE): 0.1 10*3/uL (ref 0.0–0.4)
Eos: 1 %
Hematocrit: 38.2 % (ref 34.0–46.6)
Hemoglobin: 12.4 g/dL (ref 11.1–15.9)
Immature Grans (Abs): 0 10*3/uL (ref 0.0–0.1)
Immature Granulocytes: 0 %
Lymphocytes Absolute: 2.6 10*3/uL (ref 0.7–3.1)
Lymphs: 25 %
MCH: 28.9 pg (ref 26.6–33.0)
MCHC: 32.5 g/dL (ref 31.5–35.7)
MCV: 89 fL (ref 79–97)
Monocytes Absolute: 0.5 10*3/uL (ref 0.1–0.9)
Monocytes: 5 %
Neutrophils Absolute: 7.2 10*3/uL — ABNORMAL HIGH (ref 1.4–7.0)
Neutrophils: 68 %
Platelets: 258 10*3/uL (ref 150–450)
RBC: 4.29 x10E6/uL (ref 3.77–5.28)
RDW: 12.1 % (ref 11.7–15.4)
WBC: 10.4 10*3/uL (ref 3.4–10.8)

## 2021-03-02 LAB — HEMOGLOBIN A1C
Est. average glucose Bld gHb Est-mCnc: 169 mg/dL
Hgb A1c MFr Bld: 7.5 % — ABNORMAL HIGH (ref 4.8–5.6)

## 2021-03-02 LAB — LIPID PANEL
Chol/HDL Ratio: 3.6 ratio (ref 0.0–4.4)
Cholesterol, Total: 228 mg/dL — ABNORMAL HIGH (ref 100–199)
HDL: 64 mg/dL (ref >39)
LDL Chol Calc (NIH): 143 mg/dL — ABNORMAL HIGH (ref 0–99)
Triglycerides: 117 mg/dL (ref 0–149)
VLDL Cholesterol Cal: 21 mg/dL (ref 5–40)

## 2021-03-08 ENCOUNTER — Other Ambulatory Visit: Payer: Self-pay

## 2021-03-13 DIAGNOSIS — H43822 Vitreomacular adhesion, left eye: Secondary | ICD-10-CM | POA: Diagnosis not present

## 2021-03-13 DIAGNOSIS — E113312 Type 2 diabetes mellitus with moderate nonproliferative diabetic retinopathy with macular edema, left eye: Secondary | ICD-10-CM | POA: Diagnosis not present

## 2021-04-10 DIAGNOSIS — E113312 Type 2 diabetes mellitus with moderate nonproliferative diabetic retinopathy with macular edema, left eye: Secondary | ICD-10-CM | POA: Diagnosis not present

## 2021-04-10 DIAGNOSIS — H43822 Vitreomacular adhesion, left eye: Secondary | ICD-10-CM | POA: Diagnosis not present

## 2021-05-08 DIAGNOSIS — E113312 Type 2 diabetes mellitus with moderate nonproliferative diabetic retinopathy with macular edema, left eye: Secondary | ICD-10-CM | POA: Diagnosis not present

## 2021-05-08 DIAGNOSIS — H43822 Vitreomacular adhesion, left eye: Secondary | ICD-10-CM | POA: Diagnosis not present

## 2021-05-08 DIAGNOSIS — H1811 Bullous keratopathy, right eye: Secondary | ICD-10-CM | POA: Diagnosis not present

## 2021-05-08 DIAGNOSIS — H35032 Hypertensive retinopathy, left eye: Secondary | ICD-10-CM | POA: Diagnosis not present

## 2021-05-08 DIAGNOSIS — H43391 Other vitreous opacities, right eye: Secondary | ICD-10-CM | POA: Diagnosis not present

## 2021-06-04 ENCOUNTER — Other Ambulatory Visit: Payer: Self-pay

## 2021-06-04 DIAGNOSIS — E113312 Type 2 diabetes mellitus with moderate nonproliferative diabetic retinopathy with macular edema, left eye: Secondary | ICD-10-CM | POA: Diagnosis not present

## 2021-06-04 DIAGNOSIS — H401132 Primary open-angle glaucoma, bilateral, moderate stage: Secondary | ICD-10-CM | POA: Diagnosis not present

## 2021-06-04 DIAGNOSIS — H179 Unspecified corneal scar and opacity: Secondary | ICD-10-CM | POA: Diagnosis not present

## 2021-06-04 DIAGNOSIS — Z794 Long term (current) use of insulin: Secondary | ICD-10-CM | POA: Diagnosis not present

## 2021-06-04 MED ORDER — BRIMONIDINE TARTRATE 0.2 % OP SOLN
OPHTHALMIC | 12 refills | Status: DC
  Filled 2021-06-04 – 2021-10-31 (×2): qty 10, 40d supply, fill #0
  Filled 2021-12-27: qty 10, 40d supply, fill #1
  Filled 2022-04-02: qty 10, 40d supply, fill #2

## 2021-06-04 MED ORDER — LATANOPROST 0.005 % OP SOLN
OPHTHALMIC | 12 refills | Status: DC
  Filled 2021-06-04 – 2021-10-31 (×2): qty 7.5, 60d supply, fill #0
  Filled 2021-12-27: qty 7.5, 60d supply, fill #1
  Filled 2022-04-02: qty 7.5, 60d supply, fill #2

## 2021-06-04 MED ORDER — DORZOLAMIDE HCL-TIMOLOL MAL 2-0.5 % OP SOLN
OPHTHALMIC | 12 refills | Status: DC
  Filled 2021-06-04 – 2021-10-31 (×2): qty 10, 40d supply, fill #0
  Filled 2021-12-27: qty 10, 40d supply, fill #1
  Filled 2022-04-02: qty 10, 40d supply, fill #2

## 2021-06-05 ENCOUNTER — Other Ambulatory Visit: Payer: Self-pay

## 2021-06-05 ENCOUNTER — Encounter: Payer: Self-pay | Admitting: Family Medicine

## 2021-06-05 ENCOUNTER — Ambulatory Visit: Payer: Medicare Other | Attending: Family Medicine | Admitting: Family Medicine

## 2021-06-05 VITALS — BP 136/79 | HR 75 | Ht 63.0 in | Wt 148.2 lb

## 2021-06-05 DIAGNOSIS — E1169 Type 2 diabetes mellitus with other specified complication: Secondary | ICD-10-CM | POA: Diagnosis not present

## 2021-06-05 DIAGNOSIS — Z794 Long term (current) use of insulin: Secondary | ICD-10-CM

## 2021-06-05 DIAGNOSIS — E1149 Type 2 diabetes mellitus with other diabetic neurological complication: Secondary | ICD-10-CM

## 2021-06-05 DIAGNOSIS — K219 Gastro-esophageal reflux disease without esophagitis: Secondary | ICD-10-CM

## 2021-06-05 DIAGNOSIS — I152 Hypertension secondary to endocrine disorders: Secondary | ICD-10-CM | POA: Diagnosis not present

## 2021-06-05 DIAGNOSIS — Z23 Encounter for immunization: Secondary | ICD-10-CM

## 2021-06-05 DIAGNOSIS — E785 Hyperlipidemia, unspecified: Secondary | ICD-10-CM

## 2021-06-05 DIAGNOSIS — E113312 Type 2 diabetes mellitus with moderate nonproliferative diabetic retinopathy with macular edema, left eye: Secondary | ICD-10-CM | POA: Diagnosis not present

## 2021-06-05 DIAGNOSIS — E1159 Type 2 diabetes mellitus with other circulatory complications: Secondary | ICD-10-CM

## 2021-06-05 DIAGNOSIS — H43822 Vitreomacular adhesion, left eye: Secondary | ICD-10-CM | POA: Diagnosis not present

## 2021-06-05 LAB — POCT GLYCOSYLATED HEMOGLOBIN (HGB A1C): HbA1c, POC (controlled diabetic range): 7.7 % — AB (ref 0.0–7.0)

## 2021-06-05 LAB — GLUCOSE, POCT (MANUAL RESULT ENTRY): POC Glucose: 105 mg/dl — AB (ref 70–99)

## 2021-06-05 MED ORDER — LISINOPRIL 20 MG PO TABS
ORAL_TABLET | Freq: Every day | ORAL | 1 refills | Status: DC
  Filled 2021-06-05 – 2021-10-31 (×2): qty 90, 90d supply, fill #0

## 2021-06-05 MED ORDER — ACCU-CHEK GUIDE W/DEVICE KIT: PACK | 0 refills | Status: DC

## 2021-06-05 MED ORDER — GABAPENTIN 300 MG PO CAPS
ORAL_CAPSULE | Freq: Every day | ORAL | 1 refills | Status: DC
  Filled 2021-06-05 – 2021-10-31 (×2): qty 90, 90d supply, fill #0

## 2021-06-05 MED ORDER — ATORVASTATIN CALCIUM 40 MG PO TABS
ORAL_TABLET | Freq: Every day | ORAL | 1 refills | Status: DC
  Filled 2021-06-05: qty 90, 90d supply, fill #0

## 2021-06-05 MED ORDER — BASAGLAR KWIKPEN 100 UNIT/ML ~~LOC~~ SOPN
30.0000 [IU] | PEN_INJECTOR | Freq: Every day | SUBCUTANEOUS | 6 refills | Status: DC
  Filled 2021-06-05 – 2021-10-31 (×2): qty 30, 90d supply, fill #0

## 2021-06-05 MED ORDER — ACCU-CHEK GUIDE VI STRP: ORAL_STRIP | 12 refills | Status: DC

## 2021-06-05 MED ORDER — PANTOPRAZOLE SODIUM 20 MG PO TBEC
DELAYED_RELEASE_TABLET | Freq: Every day | ORAL | 1 refills | Status: DC
  Filled 2021-06-05 – 2021-10-31 (×2): qty 90, 90d supply, fill #0

## 2021-06-05 MED ORDER — ACCU-CHEK SOFTCLIX LANCETS MISC: 12 refills | Status: DC

## 2021-06-05 MED ORDER — METFORMIN HCL ER 500 MG PO TB24
ORAL_TABLET | Freq: Every day | ORAL | 1 refills | Status: DC
  Filled 2021-06-05 – 2021-10-31 (×2): qty 180, 90d supply, fill #0

## 2021-06-05 NOTE — Progress Notes (Signed)
Traveling on [place] needs glucose meter. Refills on meds enough for 3 months. Bg 105

## 2021-06-05 NOTE — Patient Instructions (Signed)
Exercising to Stay Healthy °To become healthy and stay healthy, it is recommended that you do moderate-intensity and vigorous-intensity exercise. You can tell that you are exercising at a moderate intensity if your heart starts beating faster and you start breathing faster but can still hold a conversation. You can tell that you are exercising at a vigorous intensity if you are breathing much harder and faster and cannot hold a conversation while exercising. °How can exercise benefit me? °Exercising regularly is important. It has many health benefits, such as: °Improving overall fitness, flexibility, and endurance. °Increasing bone density. °Helping with weight control. °Decreasing body fat. °Increasing muscle strength and endurance. °Reducing stress and tension, anxiety, depression, or anger. °Improving overall health. °What guidelines should I follow while exercising? °Before you start a new exercise program, talk with your health care provider. °Do not exercise so much that you hurt yourself, feel dizzy, or get very short of breath. °Wear comfortable clothes and wear shoes with good support. °Drink plenty of water while you exercise to prevent dehydration or heat stroke. °Work out until your breathing and your heartbeat get faster (moderate intensity). °How often should I exercise? °Choose an activity that you enjoy, and set realistic goals. Your health care provider can help you make an activity plan that is individually designed and works best for you. °Exercise regularly as told by your health care provider. This may include: °Doing strength training two times a week, such as: °Lifting weights. °Using resistance bands. °Push-ups. °Sit-ups. °Yoga. °Doing a certain intensity of exercise for a given amount of time. Choose from these options: °A total of 150 minutes of moderate-intensity exercise every week. °A total of 75 minutes of vigorous-intensity exercise every week. °A mix of moderate-intensity and  vigorous-intensity exercise every week. °Children, pregnant women, people who have not exercised regularly, people who are overweight, and older adults may need to talk with a health care provider about what activities are safe to perform. If you have a medical condition, be sure to talk with your health care provider before you start a new exercise program. °What are some exercise ideas? °Moderate-intensity exercise ideas include: °Walking 1 mile (1.6 km) in about 15 minutes. °Biking. °Hiking. °Golfing. °Dancing. °Water aerobics. °Vigorous-intensity exercise ideas include: °Walking 4.5 miles (7.2 km) or more in about 1 hour. °Jogging or running 5 miles (8 km) in about 1 hour. °Biking 10 miles (16.1 km) or more in about 1 hour. °Lap swimming. °Roller-skating or in-line skating. °Cross-country skiing. °Vigorous competitive sports, such as football, basketball, and soccer. °Jumping rope. °Aerobic dancing. °What are some everyday activities that can help me get exercise? °Yard work, such as: °Pushing a lawn mower. °Raking and bagging leaves. °Washing your car. °Pushing a stroller. °Shoveling snow. °Gardening. °Washing windows or floors. °How can I be more active in my day-to-day activities? °Use stairs instead of an elevator. °Take a walk during your lunch break. °If you drive, park your car farther away from your work or school. °If you take public transportation, get off one stop early and walk the rest of the way. °Stand up or walk around during all of your indoor phone calls. °Get up, stretch, and walk around every 30 minutes throughout the day. °Enjoy exercise with a friend. Support to continue exercising will help you keep a regular routine of activity. °Where to find more information °You can find more information about exercising to stay healthy from: °[place] Department of Health and Human Services: www.hhs.gov °Centers for Disease Control and Prevention (  CDC): www.cdc.gov °Summary °Exercising regularly is  important. It will improve your overall fitness, flexibility, and endurance. °Regular exercise will also improve your overall health. It can help you control your weight, reduce stress, and improve your bone density. °Do not exercise so much that you hurt yourself, feel dizzy, or get very short of breath. °Before you start a new exercise program, talk with your health care provider. °This information is not intended to replace advice given to you by your health care provider. Make sure you discuss any questions you have with your health care provider. °Document Revised: 10/13/2020 Document Reviewed: 10/13/2020 °Elsevier Patient Education © 2022 Elsevier Inc. ° °

## 2021-06-05 NOTE — Progress Notes (Signed)
Subjective:  Patient ID: Tina Phelps, female    DOB: 1945-11-22  Age: 75 y.o. MRN: [id number]  CC: Diabetes   HPI Tina Phelps is a 75 y.o. year old female with a history of type 2 diabetes mellitus (A1c 7.7), hypertension, hyperlipidemia, osteoarthritis, GERD who presents today accompanied by her daughter for a follow-up visit.  Interval History: She will be traveling out of the country and is requesting a 90-day supply of her medications. Doing well on her diabetic regimen with no hypoglycemia, numbness in extremities or visual concerns.  She recently had an eye exam. Compliant with her antihypertensive and her statin.  For her osteoarthritis she is doing well on Voltaren gel. She recently got over the flu and is doing well now. Denies additional concerns today. Past Medical History:  Diagnosis Date   Arthritis    Diabetes mellitus without complication (HCC)    Glaucoma    Hypertension     Past Surgical History:  Procedure Laterality Date   CHOLECYSTECTOMY     NEPHRECTOMY Right    OOPHORECTOMY Right     Family History  Problem Relation Age of Onset   Diabetes Sister    Diabetes Brother    Diabetes Daughter     No Known Allergies  Outpatient Medications Prior to Visit  Medication Sig Dispense Refill   ACCU-CHEK FASTCLIX LANCETS MISC Use as directed 100 each 3   brimonidine (ALPHAGAN) 0.2 % ophthalmic solution Place 1 drop into both eyes 3 (three) times daily.     brimonidine (ALPHAGAN) 0.2 % ophthalmic solution INSTILL 1 DROP INTO BOTH EYES TWICE A DAY 15 mL 12   brimonidine (ALPHAGAN) 0.2 % ophthalmic solution Instill 1 drop into both eyes twice daily 15 mL 12   ciclopirox (PENLAC) 8 % solution Apply topically at bedtime. Apply over nail and surrounding skin. Apply daily over previous coat. After seven (7) days, may remove with alcohol and continue cycle. 6.6 mL 0   diclofenac sodium (VOLTAREN) 1 % GEL Apply 2 g topically 4 (four) times daily. 300 g 3    dorzolamide-timolol (COSOPT) 22.3-6.8 MG/ML ophthalmic solution Place 1 drop into both eyes 2 (two) times daily. 10 mL 3   dorzolamide-timolol (COSOPT) 22.3-6.8 MG/ML ophthalmic solution INSTILL 1 DROP INTO BOTH EYES TWICE A DAY 15 mL 12   dorzolamide-timolol (COSOPT) 22.3-6.8 MG/ML ophthalmic solution instill 1 drop into both eyes twice daily 15 mL 12   Insulin Pen Needle (B-D UF III MINI PEN NEEDLES) 31G X 5 MM MISC USE AS DIRECTED 100 each 1   latanoprost (XALATAN) 0.005 % ophthalmic solution 1 drop at bedtime.     latanoprost (XALATAN) 0.005 % ophthalmic solution INSTILL 1 DROP INTO BOTH EYES EVERY EVENING 2.5 mL 12   latanoprost (XALATAN) 0.005 % ophthalmic solution Instill 1 drop into both eyes every evening 7.5 mL 12   MURO 128 5 % ophthalmic ointment SMARTSIG:1 Inch(es) In Eye(s) Every Evening     timolol (BETIMOL) 0.5 % ophthalmic solution Place 1 drop into both eyes at bedtime. 10 mL 3   timolol (TIMOPTIC) 0.5 % ophthalmic solution timolol maleate 0.5 % eye drops  1 DROP TO EFEECTED EYE(S) DAILY     trimethoprim-polymyxin b (POLYTRIM) ophthalmic solution Polytrim 10,000 unit-1 mg/mL eye drops  INSTILL 1 DROP INTO AFFECTED EYE(S) BY OPHTHALMIC ROUTE EVERY 6 HOURS     Accu-Chek Softclix Lancets lancets Use as instructed 100 each 12   atorvastatin (LIPITOR) 40 MG tablet TAKE 1 TABLET (40 MG TOTAL)  BY MOUTH DAILY. 90 tablet 1   Blood Glucose Monitoring Suppl (ACCU-CHEK AVIVA PLUS) w/Device KIT Use as directed 1 kit 0   gabapentin (NEURONTIN) 300 MG capsule TAKE 1 CAPSULE (300 MG TOTAL) BY MOUTH AT BEDTIME. 90 capsule 1   glucose blood (ACCU-CHEK AVIVA PLUS) test strip Use to check blood sugars twice a day. DX Code E11.8 100 each 12   Insulin Glargine (BASAGLAR KWIKPEN) 100 UNIT/ML INJECT 30 UNITS INTO THE SKIN DAILY. 30 mL 6   lisinopril (ZESTRIL) 20 MG tablet TAKE 1 TABLET (20 MG TOTAL) BY MOUTH DAILY. 90 tablet 1   metFORMIN (GLUCOPHAGE-XR) 500 MG 24 hr tablet TAKE 2 TABLETS (1,000 MG  TOTAL) BY MOUTH DAILY WITH BREAKFAST. 180 tablet 1   pantoprazole (PROTONIX) 20 MG tablet TAKE 1 TABLET (20 MG TOTAL) BY MOUTH DAILY. 90 tablet 1   No facility-administered medications prior to visit.     ROS Review of Systems  Constitutional:  Negative for activity change, appetite change and fatigue.  HENT:  Negative for congestion, sinus pressure and sore throat.   Eyes:  Negative for visual disturbance.  Respiratory:  Negative for cough, chest tightness, shortness of breath and wheezing.   Cardiovascular:  Negative for chest pain and palpitations.  Gastrointestinal:  Negative for abdominal distention, abdominal pain and constipation.  Endocrine: Negative for polydipsia.  Genitourinary:  Negative for dysuria and frequency.  Musculoskeletal:  Negative for arthralgias and back pain.  Skin:  Negative for rash.  Neurological:  Negative for tremors, light-headedness and numbness.  Hematological:  Does not bruise/bleed easily.  Psychiatric/Behavioral:  Negative for agitation and behavioral problems.    Objective:  BP 136/79   Pulse 75   Ht $R'5\' 3"'JS$  (1.6 m)   Wt 148 lb 3.2 oz (67.2 kg)   SpO2 100%   BMI 26.25 kg/m   BP/Weight 06/05/2021 06/07/2020 7/84/6962  Systolic BP 952 841 324  Diastolic BP 79 90 88  Wt. (Lbs) 148.2 150.4 147.6  BMI 26.25 26.64 26.15      Physical Exam Constitutional:      Appearance: She is well-developed.  Cardiovascular:     Rate and Rhythm: Normal rate.     Heart sounds: Normal heart sounds. No murmur heard. Pulmonary:     Effort: Pulmonary effort is normal.     Breath sounds: Normal breath sounds. No wheezing or rales.  Chest:     Chest wall: No tenderness.  Abdominal:     General: Bowel sounds are normal. There is no distension.     Palpations: Abdomen is soft. There is no mass.     Tenderness: There is no abdominal tenderness.  Musculoskeletal:        General: Normal range of motion.     Right lower leg: No edema.     Left lower leg: No  edema.  Neurological:     Mental Status: She is alert and oriented to person, place, and time.  Psychiatric:        Mood and Affect: Mood normal.    CMP Latest Ref Rng & Units 03/01/2021 06/07/2020 02/09/2019  Glucose 65 - 99 mg/dL 107(H) 221(H) 143(H)  BUN 8 - 27 mg/dL $Remove'18 16 21  'KZHvGZg$ Creatinine 0.57 - 1.00 mg/dL 1.03(H) 0.96 1.02(H)  Sodium 134 - 144 mmol/L 139 139 141  Potassium 3.5 - 5.2 mmol/L 4.4 4.6 4.5  Chloride 96 - 106 mmol/L 101 100 103  CO2 20 - 29 mmol/L $RemoveB'23 24 24  'yBxakZYZ$ Calcium 8.7 - 10.3 mg/dL  9.6 10.0 9.8  Total Protein 6.0 - 8.5 g/dL 6.9 7.2 6.7  Total Bilirubin 0.0 - 1.2 mg/dL 0.4 0.4 0.5  Alkaline Phos 44 - 121 IU/L 103 113 104  AST 0 - 40 IU/L $Remov'16 17 17  'RodRxA$ ALT 0 - 32 IU/L $Remov'10 17 13    'GuxYSw$ Lipid Panel     Component Value Date/Time   CHOL 228 (H) 03/01/2021 0902   TRIG 117 03/01/2021 0902   HDL 64 03/01/2021 0902   CHOLHDL 3.6 03/01/2021 0902   CHOLHDL 3.2 12/15/2013 1011   VLDL 26 12/15/2013 1011   LDLCALC 143 (H) 03/01/2021 0902    CBC    Component Value Date/Time   WBC 10.4 03/01/2021 0902   WBC 7.4 12/15/2013 1011   RBC 4.29 03/01/2021 0902   RBC 4.71 12/15/2013 1011   HGB 12.4 03/01/2021 0902   HCT 38.2 03/01/2021 0902   PLT 258 03/01/2021 0902   MCV 89 03/01/2021 0902   MCH 28.9 03/01/2021 0902   MCH 29.5 12/15/2013 1011   MCHC 32.5 03/01/2021 0902   MCHC 34.6 12/15/2013 1011   RDW 12.1 03/01/2021 0902   LYMPHSABS 2.6 03/01/2021 0902   MONOABS 0.4 12/15/2013 1011   EOSABS 0.1 03/01/2021 0902   BASOSABS 0.1 03/01/2021 0902    Lab Results  Component Value Date   HGBA1C 7.7 (A) 06/05/2021    Assessment & Plan:  1. Hyperlipidemia associated with type 2 diabetes mellitus ([place]) Uncontrolled Currently on Lipitor Encouraged to comply with low-cholesterol diet Check lipid panel - atorvastatin (LIPITOR) 40 MG tablet; TAKE 1 TABLET (40 MG TOTAL) BY MOUTH DAILY.  Dispense: 90 tablet; Refill: 1 - LP+Non-HDL Cholesterol - CMP14+EGFR  2. Type 2 diabetes  mellitus with other specified complication, with long-term current use of insulin (HCC) Stable with A1c of 7.7 Continue current regimen Counseled on Diabetic diet, my plate method, 423 minutes of moderate intensity exercise/week Blood sugar logs with fasting goals of 80-120 mg/dl, random of less than 180 and in the event of sugars less than 60 mg/dl or greater than 400 mg/dl encouraged to notify the clinic. Advised on the need for annual eye exams, annual foot exams, Pneumonia vaccine. - POCT glucose (manual entry) - POCT glycosylated hemoglobin (Hb A1C) - glucose blood (ACCU-CHEK GUIDE) test strip; Use as instructed tid  Dispense: 100 each; Refill: 12 - Blood Glucose Monitoring Suppl (ACCU-CHEK GUIDE) w/Device KIT; Use as directed tid  Dispense: 1 kit; Refill: 0 - Accu-Chek Softclix Lancets lancets; Use as instructed tid before meals  Dispense: 100 each; Refill: 12 - gabapentin (NEURONTIN) 300 MG capsule; TAKE 1 CAPSULE (300 MG TOTAL) BY MOUTH AT BEDTIME.  Dispense: 90 capsule; Refill: 1 - Insulin Glargine (BASAGLAR KWIKPEN) 100 UNIT/ML; INJECT 30 UNITS INTO THE SKIN DAILY.  Dispense: 30 mL; Refill: 6 - lisinopril (ZESTRIL) 20 MG tablet; TAKE 1 TABLET (20 MG TOTAL) BY MOUTH DAILY.  Dispense: 90 tablet; Refill: 1 - metFORMIN (GLUCOPHAGE-XR) 500 MG 24 hr tablet; TAKE 2 TABLETS (1,000 MG TOTAL) BY MOUTH DAILY WITH BREAKFAST.  Dispense: 180 tablet; Refill: 1  3. Other diabetic neurological complication associated with type 2 diabetes mellitus (HCC) Stable - gabapentin (NEURONTIN) 300 MG capsule; TAKE 1 CAPSULE (300 MG TOTAL) BY MOUTH AT BEDTIME.  Dispense: 90 capsule; Refill: 1  4. Hypertension associated with diabetes ([place]) Controlled Counseled on blood pressure goal of less than 130/80, low-sodium, DASH diet, medication compliance, 150 minutes of moderate intensity exercise per week. Discussed medication compliance, adverse effects. - lisinopril (  ZESTRIL) 20 MG tablet; TAKE 1 TABLET (20 MG  TOTAL) BY MOUTH DAILY.  Dispense: 90 tablet; Refill: 1  5. Gastroesophageal reflux disease without esophagitis Stable - pantoprazole (PROTONIX) 20 MG tablet; TAKE 1 TABLET (20 MG TOTAL) BY MOUTH DAILY.  Dispense: 90 tablet; Refill: 1  6. Need for immunization against influenza - Flu Vaccine QUAD 57mo+IM (Fluarix, Fluzone & Alfiuria Quad PF)  7. Need for pneumococcal vaccine - Pneumococcal conjugate vaccine 20-valent   Meds ordered this encounter  Medications   glucose blood (ACCU-CHEK GUIDE) test strip    Sig: Use as instructed tid    Dispense:  100 each    Refill:  12   Blood Glucose Monitoring Suppl (ACCU-CHEK GUIDE) w/Device KIT    Sig: Use as directed tid    Dispense:  1 kit    Refill:  0   Accu-Chek Softclix Lancets lancets    Sig: Use as instructed tid before meals    Dispense:  100 each    Refill:  12   atorvastatin (LIPITOR) 40 MG tablet    Sig: TAKE 1 TABLET (40 MG TOTAL) BY MOUTH DAILY.    Dispense:  90 tablet    Refill:  1   gabapentin (NEURONTIN) 300 MG capsule    Sig: TAKE 1 CAPSULE (300 MG TOTAL) BY MOUTH AT BEDTIME.    Dispense:  90 capsule    Refill:  1   Insulin Glargine (BASAGLAR KWIKPEN) 100 UNIT/ML    Sig: INJECT 30 UNITS INTO THE SKIN DAILY.    Dispense:  30 mL    Refill:  6   lisinopril (ZESTRIL) 20 MG tablet    Sig: TAKE 1 TABLET (20 MG TOTAL) BY MOUTH DAILY.    Dispense:  90 tablet    Refill:  1   metFORMIN (GLUCOPHAGE-XR) 500 MG 24 hr tablet    Sig: TAKE 2 TABLETS (1,000 MG TOTAL) BY MOUTH DAILY WITH BREAKFAST.    Dispense:  180 tablet    Refill:  1   pantoprazole (PROTONIX) 20 MG tablet    Sig: TAKE 1 TABLET (20 MG TOTAL) BY MOUTH DAILY.    Dispense:  90 tablet    Refill:  1     Follow-up: Return in about 3 months (around 09/03/2021) for Chronic medical conditions.       Charlott Rakes, MD, FAAFP. [facility] [place], [place]   06/05/2021, 12:49 PM

## 2021-06-06 ENCOUNTER — Other Ambulatory Visit: Payer: Self-pay

## 2021-06-06 ENCOUNTER — Other Ambulatory Visit: Payer: Self-pay | Admitting: Family Medicine

## 2021-06-06 DIAGNOSIS — E785 Hyperlipidemia, unspecified: Secondary | ICD-10-CM

## 2021-06-06 DIAGNOSIS — E1169 Type 2 diabetes mellitus with other specified complication: Secondary | ICD-10-CM

## 2021-06-06 LAB — CMP14+EGFR
ALT: 13 IU/L (ref 0–32)
AST: 18 IU/L (ref 0–40)
Albumin/Globulin Ratio: 1.5 (ref 1.2–2.2)
Albumin: 4.3 g/dL (ref 3.7–4.7)
Alkaline Phosphatase: 110 IU/L (ref 44–121)
BUN/Creatinine Ratio: 25 (ref 12–28)
BUN: 27 mg/dL (ref 8–27)
Bilirubin Total: 0.5 mg/dL (ref 0.0–1.2)
CO2: 24 mmol/L (ref 20–29)
Calcium: 9.9 mg/dL (ref 8.7–10.3)
Chloride: 103 mmol/L (ref 96–106)
Creatinine, Ser: 1.08 mg/dL — ABNORMAL HIGH (ref 0.57–1.00)
Globulin, Total: 2.9 g/dL (ref 1.5–4.5)
Glucose: 111 mg/dL — ABNORMAL HIGH (ref 70–99)
Potassium: 4.6 mmol/L (ref 3.5–5.2)
Sodium: 141 mmol/L (ref 134–144)
Total Protein: 7.2 g/dL (ref 6.0–8.5)
eGFR: 54 mL/min/{1.73_m2} — ABNORMAL LOW (ref >59)

## 2021-06-06 LAB — LP+NON-HDL CHOLESTEROL
Cholesterol, Total: 222 mg/dL — ABNORMAL HIGH (ref 100–199)
HDL: 62 mg/dL (ref >39)
LDL Chol Calc (NIH): 140 mg/dL — ABNORMAL HIGH (ref 0–99)
Total Non-HDL-Chol (LDL+VLDL): 160 mg/dL — ABNORMAL HIGH (ref 0–129)
Triglycerides: 113 mg/dL (ref 0–149)
VLDL Cholesterol Cal: 20 mg/dL (ref 5–40)

## 2021-06-06 MED ORDER — ATORVASTATIN CALCIUM 80 MG PO TABS
80.0000 mg | ORAL_TABLET | Freq: Every day | ORAL | 1 refills | Status: DC
  Filled 2021-06-06: qty 90, 90d supply, fill #0

## 2021-06-13 ENCOUNTER — Other Ambulatory Visit: Payer: Self-pay

## 2021-10-30 ENCOUNTER — Ambulatory Visit: Payer: Self-pay | Admitting: *Deleted

## 2021-10-30 NOTE — Telephone Encounter (Signed)
Reason for Disposition ? [1] MODERATE leg swelling (e.g., swelling extends up to knees) AND [2] new-onset or worsening ? ?Answer Assessment - Initial Assessment Questions ?1. ONSET: "When did the swelling start?" (e.g., minutes, hours, days) ?    2 months ?2. LOCATION: "What part of the leg is swollen?"  "Are both legs swollen or just one leg?" ?    Foot to knee- both legs ?3. SEVERITY: "How bad is the swelling?" (e.g., localized; mild, moderate, severe) ? - Localized - small area of swelling localized to one leg ? - MILD pedal edema - swelling limited to foot and ankle, pitting edema < 1/4 inch (6 mm) deep, rest and elevation eliminate most or all swelling ? - MODERATE edema - swelling of lower leg to knee, pitting edema > 1/4 inch (6 mm) deep, rest and elevation only partially reduce swelling ? - SEVERE edema - swelling extends above knee, facial or hand swelling present  ?    moderate ?4. REDNESS: "Does the swelling look red or infected?" ?    no ?5. PAIN: "Is the swelling painful to touch?" If Yes, ask: "How painful is it?"   (Scale 1-10; mild, moderate or severe) ?    no ?6. FEVER: "Do you have a fever?" If Yes, ask: "What is it, how was it measured, and when did it start?"  ?    no ?7. CAUSE: "What do you think is causing the leg swelling?" ?    unsure ?8. MEDICAL HISTORY: "Do you have a history of heart failure, kidney disease, liver failure, or cancer?" ?    Kidney disease ?9. RECURRENT SYMPTOM: "Have you had leg swelling before?" If Yes, ask: "When was the last time?" "What happened that time?" ?    no ?10. OTHER SYMPTOMS: "Do you have any other symptoms?" (e.g., chest pain, difficulty breathing) ?      no ?11. PREGNANCY: "Is there any chance you are pregnant?" "When was your last menstrual period?" ? ?Protocols used: Leg Swelling and Edema-A-AH ? ?

## 2021-10-30 NOTE — Telephone Encounter (Signed)
?  Chief Complaint: bilateral leg swelling ?Symptoms: foot/leg swelling to knee ?Frequency: 2 months ?Pertinent Negatives: Patient denies pain,redness ?Disposition: '[]'$ ED /'[x]'$ Urgent Care (no appt availability in office) / '[]'$ Appointment(In office/virtual)/ '[]'$  [facility]/ '[]'$ Home Care/ '[]'$ Refused Recommended Disposition /'[]'$ [facility]/ '[]'$  Follow-up with PCP ?Additional Notes: no open appointment- advised UC  ?

## 2021-10-31 ENCOUNTER — Other Ambulatory Visit: Payer: Self-pay

## 2021-11-03 DIAGNOSIS — R112 Nausea with vomiting, unspecified: Secondary | ICD-10-CM | POA: Diagnosis not present

## 2021-11-03 DIAGNOSIS — K219 Gastro-esophageal reflux disease without esophagitis: Secondary | ICD-10-CM | POA: Diagnosis not present

## 2021-11-03 DIAGNOSIS — E1165 Type 2 diabetes mellitus with hyperglycemia: Secondary | ICD-10-CM | POA: Diagnosis not present

## 2021-11-05 ENCOUNTER — Other Ambulatory Visit: Payer: Self-pay

## 2021-11-05 MED ORDER — ONDANSETRON 8 MG PO TBDP
ORAL_TABLET | ORAL | 0 refills | Status: DC
  Filled 2021-11-05: qty 6, 3d supply, fill #0

## 2021-11-05 MED ORDER — LANTUS SOLOSTAR 100 UNIT/ML ~~LOC~~ SOPN
PEN_INJECTOR | SUBCUTANEOUS | 5 refills | Status: DC
  Filled 2021-11-05: qty 12, 34d supply, fill #0

## 2021-11-05 MED ORDER — PANTOPRAZOLE SODIUM 20 MG PO TBEC
DELAYED_RELEASE_TABLET | ORAL | 3 refills | Status: DC
  Filled 2021-11-05 – 2022-04-02 (×3): qty 60, 30d supply, fill #0

## 2021-12-24 DIAGNOSIS — H43822 Vitreomacular adhesion, left eye: Secondary | ICD-10-CM | POA: Diagnosis not present

## 2021-12-24 DIAGNOSIS — H1811 Bullous keratopathy, right eye: Secondary | ICD-10-CM | POA: Diagnosis not present

## 2021-12-24 DIAGNOSIS — H35032 Hypertensive retinopathy, left eye: Secondary | ICD-10-CM | POA: Diagnosis not present

## 2021-12-24 DIAGNOSIS — E113312 Type 2 diabetes mellitus with moderate nonproliferative diabetic retinopathy with macular edema, left eye: Secondary | ICD-10-CM | POA: Diagnosis not present

## 2021-12-25 ENCOUNTER — Ambulatory Visit: Payer: Medicare Other | Admitting: Family Medicine

## 2021-12-27 ENCOUNTER — Ambulatory Visit: Payer: Medicare Other | Attending: Family Medicine | Admitting: Family Medicine

## 2021-12-27 ENCOUNTER — Encounter: Payer: Self-pay | Admitting: Family Medicine

## 2021-12-27 ENCOUNTER — Other Ambulatory Visit: Payer: Self-pay

## 2021-12-27 ENCOUNTER — Telehealth: Payer: Self-pay | Admitting: Family Medicine

## 2021-12-27 VITALS — BP 156/83 | HR 77 | Temp 98.3°F | Ht 63.0 in | Wt 154.6 lb

## 2021-12-27 DIAGNOSIS — E1149 Type 2 diabetes mellitus with other diabetic neurological complication: Secondary | ICD-10-CM

## 2021-12-27 DIAGNOSIS — Z79899 Other long term (current) drug therapy: Secondary | ICD-10-CM | POA: Diagnosis not present

## 2021-12-27 DIAGNOSIS — I152 Hypertension secondary to endocrine disorders: Secondary | ICD-10-CM

## 2021-12-27 DIAGNOSIS — Z7984 Long term (current) use of oral hypoglycemic drugs: Secondary | ICD-10-CM | POA: Diagnosis not present

## 2021-12-27 DIAGNOSIS — I10 Essential (primary) hypertension: Secondary | ICD-10-CM | POA: Diagnosis not present

## 2021-12-27 DIAGNOSIS — R600 Localized edema: Secondary | ICD-10-CM | POA: Diagnosis not present

## 2021-12-27 DIAGNOSIS — E1159 Type 2 diabetes mellitus with other circulatory complications: Secondary | ICD-10-CM | POA: Diagnosis not present

## 2021-12-27 DIAGNOSIS — E785 Hyperlipidemia, unspecified: Secondary | ICD-10-CM | POA: Diagnosis not present

## 2021-12-27 DIAGNOSIS — M7989 Other specified soft tissue disorders: Secondary | ICD-10-CM | POA: Diagnosis not present

## 2021-12-27 DIAGNOSIS — B351 Tinea unguium: Secondary | ICD-10-CM | POA: Diagnosis not present

## 2021-12-27 DIAGNOSIS — Z905 Acquired absence of kidney: Secondary | ICD-10-CM | POA: Diagnosis not present

## 2021-12-27 DIAGNOSIS — Z833 Family history of diabetes mellitus: Secondary | ICD-10-CM | POA: Insufficient documentation

## 2021-12-27 DIAGNOSIS — E1169 Type 2 diabetes mellitus with other specified complication: Secondary | ICD-10-CM | POA: Insufficient documentation

## 2021-12-27 DIAGNOSIS — Z794 Long term (current) use of insulin: Secondary | ICD-10-CM | POA: Insufficient documentation

## 2021-12-27 DIAGNOSIS — K219 Gastro-esophageal reflux disease without esophagitis: Secondary | ICD-10-CM | POA: Insufficient documentation

## 2021-12-27 DIAGNOSIS — M1990 Unspecified osteoarthritis, unspecified site: Secondary | ICD-10-CM | POA: Insufficient documentation

## 2021-12-27 DIAGNOSIS — L608 Other nail disorders: Secondary | ICD-10-CM | POA: Insufficient documentation

## 2021-12-27 LAB — POCT GLYCOSYLATED HEMOGLOBIN (HGB A1C): HbA1c, POC (controlled diabetic range): 7.7 % — AB (ref 0.0–7.0)

## 2021-12-27 LAB — GLUCOSE, POCT (MANUAL RESULT ENTRY): POC Glucose: 89 mg/dl (ref 70–99)

## 2021-12-27 MED ORDER — CHLORTHALIDONE 25 MG PO TABS
25.0000 mg | ORAL_TABLET | Freq: Every day | ORAL | 1 refills | Status: DC
  Filled 2021-12-27: qty 90, 90d supply, fill #0
  Filled 2022-04-02: qty 90, 90d supply, fill #1

## 2021-12-27 MED ORDER — GABAPENTIN 300 MG PO CAPS
ORAL_CAPSULE | Freq: Every day | ORAL | 1 refills | Status: DC
  Filled 2021-12-27: qty 90, fill #0
  Filled 2022-04-02: qty 90, 90d supply, fill #0
  Filled 2022-07-18: qty 90, 90d supply, fill #1

## 2021-12-27 MED ORDER — METFORMIN HCL ER 500 MG PO TB24
ORAL_TABLET | Freq: Every day | ORAL | 1 refills | Status: DC
  Filled 2021-12-27: qty 180, fill #0
  Filled 2022-04-02: qty 180, 90d supply, fill #0
  Filled 2022-07-18: qty 180, 90d supply, fill #1

## 2021-12-27 MED ORDER — TERBINAFINE HCL 250 MG PO TABS
250.0000 mg | ORAL_TABLET | Freq: Every day | ORAL | 1 refills | Status: DC
  Filled 2021-12-27: qty 30, 30d supply, fill #0
  Filled 2022-04-02: qty 30, 30d supply, fill #1

## 2021-12-27 MED ORDER — DEXCOM G6 SENSOR MISC
2 refills | Status: DC
  Filled 2021-12-27 – 2022-09-12 (×2): qty 3, 30d supply, fill #0

## 2021-12-27 MED ORDER — ATORVASTATIN CALCIUM 80 MG PO TABS
80.0000 mg | ORAL_TABLET | Freq: Every day | ORAL | 1 refills | Status: DC
  Filled 2021-12-27: qty 90, 90d supply, fill #0
  Filled 2022-04-02: qty 90, 90d supply, fill #1

## 2021-12-27 MED ORDER — BASAGLAR KWIKPEN 100 UNIT/ML ~~LOC~~ SOPN
32.0000 [IU] | PEN_INJECTOR | Freq: Every day | SUBCUTANEOUS | 6 refills | Status: DC
  Filled 2021-12-27: qty 30, 93d supply, fill #0
  Filled 2022-04-02: qty 30, 90d supply, fill #0

## 2021-12-27 MED ORDER — DEXCOM G6 RECEIVER DEVI
0 refills | Status: DC
Start: 2021-12-27 — End: 2023-03-18
  Filled 2021-12-27 – 2022-09-12 (×2): qty 1, 30d supply, fill #0

## 2021-12-27 MED ORDER — LISINOPRIL 20 MG PO TABS
ORAL_TABLET | Freq: Every day | ORAL | 1 refills | Status: DC
  Filled 2021-12-27: qty 90, fill #0
  Filled 2022-04-02: qty 90, 90d supply, fill #0
  Filled 2022-07-18: qty 90, 90d supply, fill #1

## 2021-12-27 MED ORDER — DEXCOM G6 TRANSMITTER MISC
1 refills | Status: DC
  Filled 2021-12-27 – 2022-09-12 (×2): qty 1, 90d supply, fill #0

## 2021-12-27 NOTE — Telephone Encounter (Signed)
Can you please send in prescription for CGM?  Thank you

## 2021-12-27 NOTE — Progress Notes (Signed)
Subjective:  Patient ID: [name], female    DOB: [birth year]  Age: 76 y.o. MRN: [id number]  CC: Diabetes   HPI Tina Phelps is a 76 y.o. year old female with a history of type 2 diabetes mellitus (A1c 7.7), hypertension, hyperlipidemia, osteoarthritis, GERD, s/p R nephrectomy (In [place] secondary to kidney cyst) who presents today accompanied by her daughter for a follow-up visit.  Interval History: She complains of pedal edema for the last couple of months which daughter had initially thought was due to prolonged trouble plain but she was seen by a Physician in [place] and they were told to monitor it.  Denies excessive sodium intake. Outside labs revealed a creatinine of 1.26, urine microalbumin was negative. Echo from [place] revealed: EF of 70%, grade I DD, MR,   She has also had elevated blood pressures despite adherence with her antihypertensive. Concerned about nail discoloration in her hands and he noticed that this led to a fingernail in her left hand falling off with a new one growing underneath it.  With regards to her diabetes mellitus she has been adherent with her current regimen.  She states she had taking her mom to urgent care and she was found to have elevated glucose.  She does have a smart watch which checks her blood sugars and fasting this morning was 6.9 mmol/L which is the equivalent of 124 mg/dL She would like a prescription for CGM. Past Medical History:  Diagnosis Date   Arthritis    Diabetes mellitus without complication ([place])    Glaucoma    Hypertension     Past Surgical History:  Procedure Laterality Date   CHOLECYSTECTOMY     NEPHRECTOMY Right    OOPHORECTOMY Right     Family History  Problem Relation Age of Onset   Diabetes Sister    Diabetes Brother    Diabetes Daughter     Social History   Socioeconomic History   Marital status: Single    Spouse name: Not on file   Number of children: Not on file   Years of education: Not on file    Highest education level: Not on file  Occupational History   Not on file  Tobacco Use   Smoking status: Never   Smokeless tobacco: Never  Substance and Sexual Activity   Alcohol use: No   Drug use: No   Sexual activity: Never  Other Topics Concern   Not on file  Social History Narrative   Not on file   Social Determinants of Health   Financial Resource Strain: Not on file  Food Insecurity: Not on file  Transportation Needs: Not on file  Physical Activity: Not on file  Stress: Not on file  Social Connections: Not on file    No Known Allergies  Outpatient Medications Prior to Visit  Medication Sig Dispense Refill   ACCU-CHEK FASTCLIX LANCETS MISC Use as directed 100 each 3   Accu-Chek Softclix Lancets lancets Use as instructed tid before meals 100 each 12   Blood Glucose Monitoring Suppl (ACCU-CHEK GUIDE) w/Device KIT Use as directed tid 1 kit 0   brimonidine (ALPHAGAN) 0.2 % ophthalmic solution Instill 1 drop into both eyes twice daily 15 mL 12   ciclopirox (PENLAC) 8 % solution Apply topically at bedtime. Apply over nail and surrounding skin. Apply daily over previous coat. After seven (7) days, may remove with alcohol and continue cycle. 6.6 mL 0   diclofenac sodium (VOLTAREN) 1 % GEL Apply 2 g topically  4 (four) times daily. 300 g 3   dorzolamide-timolol (COSOPT) 22.3-6.8 MG/ML ophthalmic solution instill 1 drop into both eyes twice daily 15 mL 12   glucose blood (ACCU-CHEK GUIDE) test strip Use as instructed tid 100 each 12   Insulin Pen Needle (B-D UF III MINI PEN NEEDLES) 31G X 5 MM MISC USE AS DIRECTED 100 each 1   latanoprost (XALATAN) 0.005 % ophthalmic solution Instill 1 drop into both eyes every evening 7.5 mL 12   MURO 128 5 % ophthalmic ointment SMARTSIG:1 Inch(es) In Eye(s) Every Evening     ondansetron (ZOFRAN-ODT) 8 MG disintegrating tablet Place 1 tablet twice a day by translingual route as needed for 3 days. 6 tablet 0   pantoprazole (PROTONIX) 20 MG tablet  Take 2 tablets every day by mouth as directed for 30 days. 60 tablet 3   timolol (BETIMOL) 0.5 % ophthalmic solution Place 1 drop into both eyes at bedtime. 10 mL 3   trimethoprim-polymyxin b (POLYTRIM) ophthalmic solution Polytrim 10,000 unit-1 mg/mL eye drops  INSTILL 1 DROP INTO AFFECTED EYE(S) BY OPHTHALMIC ROUTE EVERY 6 HOURS     atorvastatin (LIPITOR) 80 MG tablet Take 1 tablet (80 mg total) by mouth daily. 90 tablet 1   gabapentin (NEURONTIN) 300 MG capsule TAKE 1 CAPSULE (300 MG TOTAL) BY MOUTH AT BEDTIME. 90 capsule 1   Insulin Glargine (BASAGLAR KWIKPEN) 100 UNIT/ML INJECT 30 UNITS INTO THE SKIN DAILY. 30 mL 6   insulin glargine (LANTUS SOLOSTAR) 100 UNIT/ML Solostar Pen Inject 35 units every day by subcutaneous route in the morning for 30 days. 12 mL 5   lisinopril (ZESTRIL) 20 MG tablet TAKE 1 TABLET (20 MG TOTAL) BY MOUTH DAILY. 90 tablet 1   metFORMIN (GLUCOPHAGE-XR) 500 MG 24 hr tablet TAKE 2 TABLETS (1,000 MG TOTAL) BY MOUTH DAILY WITH BREAKFAST. 180 tablet 1   brimonidine (ALPHAGAN) 0.2 % ophthalmic solution Place 1 drop into both eyes 3 (three) times daily.     dorzolamide-timolol (COSOPT) 22.3-6.8 MG/ML ophthalmic solution Place 1 drop into both eyes 2 (two) times daily. 10 mL 3   latanoprost (XALATAN) 0.005 % ophthalmic solution 1 drop at bedtime.     pantoprazole (PROTONIX) 20 MG tablet TAKE 1 TABLET (20 MG TOTAL) BY MOUTH DAILY. 90 tablet 1   timolol (TIMOPTIC) 0.5 % ophthalmic solution timolol maleate 0.5 % eye drops  1 DROP TO EFEECTED EYE(S) DAILY     No facility-administered medications prior to visit.     ROS Review of Systems  Constitutional:  Negative for activity change and appetite change.  HENT:  Negative for sinus pressure and sore throat.   Respiratory:  Negative for chest tightness, shortness of breath and wheezing.   Cardiovascular:  Positive for leg swelling. Negative for chest pain and palpitations.  Gastrointestinal:  Negative for abdominal  distention, abdominal pain and constipation.  Genitourinary: Negative.   Musculoskeletal: Negative.   Skin:        See HPI  Psychiatric/Behavioral:  Negative for behavioral problems and dysphoric mood.     Objective:  BP (!) 156/83   Pulse 77   Temp 98.3 F (36.8 C) (Oral)   Ht 5' 3" (1.6 m)   Wt 154 lb 9.6 oz (70.1 kg)   SpO2 100%   BMI 27.39 kg/m      12/27/2021   10:28 AM 06/05/2021    9:22 AM 06/07/2020    9:38 AM  BP/Weight  Systolic BP 768 115 726  Diastolic BP 83  79 90  Wt. (Lbs) 154.6 148.2 150.4  BMI 27.39 kg/m2 26.25 kg/m2 26.64 kg/m2      Physical Exam Constitutional:      Appearance: She is well-developed.  Cardiovascular:     Rate and Rhythm: Normal rate.     Heart sounds: Normal heart sounds. No murmur heard. Pulmonary:     Effort: Pulmonary effort is normal.     Breath sounds: Normal breath sounds. No wheezing or rales.  Chest:     Chest wall: No tenderness.  Abdominal:     General: Bowel sounds are normal. There is no distension.     Palpations: Abdomen is soft. There is no mass.     Tenderness: There is no abdominal tenderness.  Musculoskeletal:        General: Normal range of motion.     Right lower leg: Edema (1+ pitting) present.     Left lower leg: Edema (1+ pitting) present.  Skin:    Comments: Right thumb and middle finger with discolored nail growing out with new nail underneath  Neurological:     Mental Status: She is alert and oriented to person, place, and time.  Psychiatric:        Mood and Affect: Mood normal.    Diabetic Foot Exam - Simple   Simple Foot Form Visual Inspection Sensation Testing Intact to touch and monofilament testing bilaterally: Yes Pulse Check Posterior Tibialis and Dorsalis pulse intact bilaterally: Yes Comments Thick dystrophic toenails bilaterally         Latest Ref Rng & Units 06/05/2021   10:10 AM 03/01/2021    9:02 AM 06/07/2020   10:41 AM  CMP  Glucose 70 - 99 mg/dL 111  107  221   BUN 8 -  27 mg/dL _0 Creatinine 0.57 - 1.00 mg/dL 1.08  1.03  0.96   Sodium 134 - 144 mmol/L 141  139  139   Potassium 3.5 - 5.2 mmol/L 4.6  4.4  4.6   Chloride 96 - 106 mmol/L 103  101  100   CO2 20 - 29 mmol/L _1 Calcium 8.7 - 10.3 mg/dL 9.9  9.6  10.0   Total Protein 6.0 - 8.5 g/dL 7.2  6.9  7.2   Total Bilirubin 0.0 - 1.2 mg/dL 0.5  0.4  0.4   Alkaline Phos 44 - 121 IU/L 110  103  113   AST 0 - 40 IU/L _2 ALT 0 - 32 IU/L _3 Lipid Panel     Component Value Date/Time   CHOL 222 (H) 06/05/2021 1010   TRIG 113 06/05/2021 1010   HDL 62 06/05/2021 1010   CHOLHDL 3.6 03/01/2021 0902   CHOLHDL 3.2 12/15/2013 1011   VLDL 26 12/15/2013 1011   LDLCALC 140 (H) 06/05/2021 1010    CBC    Component Value Date/Time   WBC 10.4 03/01/2021 0902   WBC 7.4 12/15/2013 1011   RBC 4.29 03/01/2021 0902   RBC 4.71 12/15/2013 1011   HGB 12.4 03/01/2021 0902   HCT 38.2 03/01/2021 0902   PLT 258 03/01/2021 0902   MCV 89 03/01/2021 0902   MCH 28.9 03/01/2021 0902   MCH 29.5 12/15/2013 1011   MCHC 32.5 03/01/2021 0902   MCHC 34.6 12/15/2013 1011   RDW 12.1 03/01/2021 0902   LYMPHSABS 2.6 03/01/2021 0902   MONOABS 0.4 12/15/2013  1011   EOSABS 0.1 03/01/2021 0902   BASOSABS 0.1 03/01/2021 0902    Lab Results  Component Value Date   HGBA1C 7.7 (A) 12/27/2021    Assessment & Plan:  1. Type 2 diabetes mellitus with other specified complication, with long-term current use of insulin (HCC) Suboptimally controlled with A1c of 7.7; goal is less than 7.0 Advised to increase Basaglar to 32 units up from 30 units nightly We will have the pharmacist send in prescription for CGM Counseled on Diabetic diet, my plate method, 568 minutes of moderate intensity exercise/week Blood sugar logs with fasting goals of 80-120 mg/dl, random of less than 180 and in the event of sugars less than 60 mg/dl or greater than 400 mg/dl encouraged to notify the clinic. Advised on the  need for annual eye exams, annual foot exams, Pneumonia vaccine. - POCT glucose (manual entry) - POCT glycosylated hemoglobin (Hb A1C) - Microalbumin / creatinine urine ratio - LP+Non-HDL Cholesterol - CMP14+EGFR - Insulin Glargine (BASAGLAR KWIKPEN) 100 UNIT/ML; Inject 32 Units into the skin daily.  Dispense: 30 mL; Refill: 6 - gabapentin (NEURONTIN) 300 MG capsule; TAKE 1 CAPSULE (300 MG TOTAL) BY MOUTH AT BEDTIME.  Dispense: 90 capsule; Refill: 1 - lisinopril (ZESTRIL) 20 MG tablet; TAKE 1 TABLET (20 MG TOTAL) BY MOUTH DAILY.  Dispense: 90 tablet; Refill: 1 - metFORMIN (GLUCOPHAGE-XR) 500 MG 24 hr tablet; TAKE 2 TABLETS (1,000 MG TOTAL) BY MOUTH DAILY WITH BREAKFAST.  Dispense: 180 tablet; Refill: 1  2. Onychomycosis Could explain nail discoloration and falling off of nails Counseled on risks and benefits of terbinafine including hepatotoxicity We will check liver enzymes She will be on this dose for 6 weeks. - terbinafine (LAMISIL) 250 MG tablet; Take 1 tablet (250 mg total) by mouth daily.  Dispense: 30 tablet; Refill: 1  3. Pedal edema Echo from outside hospital reveals normal ejection fraction We will place on diuretic which will also help with her blood pressure Encouraged to comply with a low-sodium diet, elevate feet, use compression stockings - Brain natriuretic peptide  4. Other diabetic neurological complication associated with type 2 diabetes mellitus (HCC) Stable - gabapentin (NEURONTIN) 300 MG capsule; TAKE 1 CAPSULE (300 MG TOTAL) BY MOUTH AT BEDTIME.  Dispense: 90 capsule; Refill: 1  5. Hyperlipidemia associated with type 2 diabetes mellitus ([place]) Last lipid panel was uncontrolled We will send of lipid panel again Low-cholesterol diet - atorvastatin (LIPITOR) 80 MG tablet; Take 1 tablet (80 mg total) by mouth daily.  Dispense: 90 tablet; Refill: 1  6. Hypertension associated with diabetes ([place]) Uncontrolled Chlorthalidone added to regimen Counseled on blood  pressure goal of less than 130/80, low-sodium, DASH diet, medication compliance, 150 minutes of moderate intensity exercise per week. Discussed medication compliance, adverse effects. - chlorthalidone (HYGROTON) 25 MG tablet; Take 1 tablet (25 mg total) by mouth daily.  Dispense: 90 tablet; Refill: 1 - lisinopril (ZESTRIL) 20 MG tablet; TAKE 1 TABLET (20 MG TOTAL) BY MOUTH DAILY.  Dispense: 90 tablet; Refill: 1     Meds ordered this encounter  Medications   chlorthalidone (HYGROTON) 25 MG tablet    Sig: Take 1 tablet (25 mg total) by mouth daily.    Dispense:  90 tablet    Refill:  1   Insulin Glargine (BASAGLAR KWIKPEN) 100 UNIT/ML    Sig: Inject 32 Units into the skin daily.    Dispense:  30 mL    Refill:  6   gabapentin (NEURONTIN) 300 MG capsule  Sig: TAKE 1 CAPSULE (300 MG TOTAL) BY MOUTH AT BEDTIME.    Dispense:  90 capsule    Refill:  1   atorvastatin (LIPITOR) 80 MG tablet    Sig: Take 1 tablet (80 mg total) by mouth daily.    Dispense:  90 tablet    Refill:  1    Dose increase   lisinopril (ZESTRIL) 20 MG tablet    Sig: TAKE 1 TABLET (20 MG TOTAL) BY MOUTH DAILY.    Dispense:  90 tablet    Refill:  1   metFORMIN (GLUCOPHAGE-XR) 500 MG 24 hr tablet    Sig: TAKE 2 TABLETS (1,000 MG TOTAL) BY MOUTH DAILY WITH BREAKFAST.    Dispense:  180 tablet    Refill:  1   terbinafine (LAMISIL) 250 MG tablet    Sig: Take 1 tablet (250 mg total) by mouth daily.    Dispense:  30 tablet    Refill:  1    Follow-up: Return in about 3 months (around 03/29/2022).       Charlott Rakes, MD, FAAFP. [facility] and [place], [place]   12/27/2021, 1:11 PM

## 2021-12-27 NOTE — Progress Notes (Signed)
Fingernail discoloration Swelling in legs BP has been elevated.

## 2021-12-27 NOTE — Telephone Encounter (Signed)
Yes ma'am, done. 

## 2021-12-27 NOTE — Patient Instructions (Signed)

## 2021-12-27 NOTE — Addendum Note (Signed)
Addended by: Daisy Blossom, Annie Main L on: 12/27/2021 01:44 PM   Modules accepted: Orders

## 2021-12-28 LAB — CMP14+EGFR
ALT: 17 IU/L (ref 0–32)
AST: 19 IU/L (ref 0–40)
Albumin/Globulin Ratio: 1.5 (ref 1.2–2.2)
Albumin: 4 g/dL (ref 3.7–4.7)
Alkaline Phosphatase: 106 IU/L (ref 44–121)
BUN/Creatinine Ratio: 15 (ref 12–28)
BUN: 17 mg/dL (ref 8–27)
Bilirubin Total: 0.3 mg/dL (ref 0.0–1.2)
CO2: 23 mmol/L (ref 20–29)
Calcium: 9.5 mg/dL (ref 8.7–10.3)
Chloride: 106 mmol/L (ref 96–106)
Creatinine, Ser: 1.14 mg/dL — ABNORMAL HIGH (ref 0.57–1.00)
Globulin, Total: 2.7 g/dL (ref 1.5–4.5)
Glucose: 86 mg/dL (ref 70–99)
Potassium: 4.9 mmol/L (ref 3.5–5.2)
Sodium: 141 mmol/L (ref 134–144)
Total Protein: 6.7 g/dL (ref 6.0–8.5)
eGFR: 51 mL/min/{1.73_m2} — ABNORMAL LOW (ref >59)

## 2021-12-28 LAB — LP+NON-HDL CHOLESTEROL
Cholesterol, Total: 183 mg/dL (ref 100–199)
HDL: 62 mg/dL (ref >39)
LDL Chol Calc (NIH): 106 mg/dL — ABNORMAL HIGH (ref 0–99)
Total Non-HDL-Chol (LDL+VLDL): 121 mg/dL (ref 0–129)
Triglycerides: 82 mg/dL (ref 0–149)
VLDL Cholesterol Cal: 15 mg/dL (ref 5–40)

## 2021-12-28 LAB — MICROALBUMIN / CREATININE URINE RATIO
Creatinine, Urine: 88.1 mg/dL
Microalb/Creat Ratio: 20 mg/g creat (ref 0–29)
Microalbumin, Urine: 17.3 ug/mL

## 2021-12-28 LAB — BRAIN NATRIURETIC PEPTIDE: BNP: 58.4 pg/mL (ref 0.0–100.0)

## 2022-02-05 ENCOUNTER — Other Ambulatory Visit: Payer: Self-pay | Admitting: *Deleted

## 2022-02-05 NOTE — Patient Outreach (Signed)
  Care Coordination   02/05/2022 Name: Sherah Lund MRN: [id number] DOB: 11/06/1945   Care Coordination Outreach Attempts:  An unsuccessful telephone outreach was attempted today to offer the patient information about available care coordination services as a benefit of their health plan.   Follow Up Plan:  Additional outreach attempts will be made to offer the patient care coordination information and services.   Encounter Outcome:  No Answer  Care Coordination Interventions Activated:  No   Care Coordination Interventions:  No, not indicated    Emelia Loron RN, BSN [place] [phone] [email or web address]

## 2022-02-11 ENCOUNTER — Encounter: Payer: Self-pay | Admitting: Family Medicine

## 2022-02-11 ENCOUNTER — Ambulatory Visit: Payer: Medicare Other | Attending: Family Medicine | Admitting: Family Medicine

## 2022-02-11 ENCOUNTER — Other Ambulatory Visit: Payer: Self-pay

## 2022-02-11 VITALS — BP 169/92 | HR 79 | Temp 98.3°F | Resp 12 | Ht 63.0 in | Wt 154.0 lb

## 2022-02-11 DIAGNOSIS — E1159 Type 2 diabetes mellitus with other circulatory complications: Secondary | ICD-10-CM | POA: Insufficient documentation

## 2022-02-11 DIAGNOSIS — L609 Nail disorder, unspecified: Secondary | ICD-10-CM

## 2022-02-11 DIAGNOSIS — E785 Hyperlipidemia, unspecified: Secondary | ICD-10-CM | POA: Insufficient documentation

## 2022-02-11 DIAGNOSIS — I10 Essential (primary) hypertension: Secondary | ICD-10-CM | POA: Insufficient documentation

## 2022-02-11 DIAGNOSIS — M1990 Unspecified osteoarthritis, unspecified site: Secondary | ICD-10-CM | POA: Insufficient documentation

## 2022-02-11 DIAGNOSIS — I152 Hypertension secondary to endocrine disorders: Secondary | ICD-10-CM | POA: Diagnosis not present

## 2022-02-11 DIAGNOSIS — K219 Gastro-esophageal reflux disease without esophagitis: Secondary | ICD-10-CM | POA: Insufficient documentation

## 2022-02-11 DIAGNOSIS — B351 Tinea unguium: Secondary | ICD-10-CM | POA: Diagnosis not present

## 2022-02-11 DIAGNOSIS — Z7984 Long term (current) use of oral hypoglycemic drugs: Secondary | ICD-10-CM | POA: Diagnosis not present

## 2022-02-11 DIAGNOSIS — Z833 Family history of diabetes mellitus: Secondary | ICD-10-CM | POA: Diagnosis not present

## 2022-02-11 DIAGNOSIS — Z79899 Other long term (current) drug therapy: Secondary | ICD-10-CM | POA: Insufficient documentation

## 2022-02-11 DIAGNOSIS — Z794 Long term (current) use of insulin: Secondary | ICD-10-CM | POA: Insufficient documentation

## 2022-02-11 NOTE — Progress Notes (Signed)
Subjective:  Patient ID: [name], female    DOB: [birth year]  Age: 76 y.o. MRN: [id number]  CC: Follow-up (From previous physical, fungal nail infection questions/)   HPI Tina Phelps is a 76 y.o. year old female with a history of type 2 diabetes mellitus (A1c 7.7), hypertension, hyperlipidemia, osteoarthritis, GERD, s/p R nephrectomy (In [place] secondary to kidney cyst) who presents today accompanied by her daughter for a follow-up visit. 6 weeks ago she had a visit for chronic disease management.  Interval History: At her last visit, terbinafine was initiated and she noticed improvement in her other finger (abnormal nail fell off) but in the nail of her right thumb she still has the abnormal nail present.  She has no pain in her fingernails. She has always had thick toenails but she cliffs her toenails at home as she did not notice any much difference when directed by the podiatrist.  Her blood pressure is elevated today and she states one of her antihypertensives was missing when her medications were picked up from the pharmacy. Past Medical History:  Diagnosis Date   Arthritis    Diabetes mellitus without complication ([place])    Glaucoma    Hypertension     Past Surgical History:  Procedure Laterality Date   CHOLECYSTECTOMY     NEPHRECTOMY Right    OOPHORECTOMY Right     Family History  Problem Relation Age of Onset   Diabetes Sister    Diabetes Brother    Diabetes Daughter     Social History   Socioeconomic History   Marital status: Single    Spouse name: Not on file   Number of children: Not on file   Years of education: Not on file   Highest education level: Not on file  Occupational History   Not on file  Tobacco Use   Smoking status: Never   Smokeless tobacco: Never  Substance and Sexual Activity   Alcohol use: No   Drug use: No   Sexual activity: Never  Other Topics Concern   Not on file  Social History Narrative   Not on file   Social  Determinants of Health   Financial Resource Strain: Not on file  Food Insecurity: Not on file  Transportation Needs: Not on file  Physical Activity: Not on file  Stress: Not on file  Social Connections: Not on file    No Known Allergies  Outpatient Medications Prior to Visit  Medication Sig Dispense Refill   ACCU-CHEK FASTCLIX LANCETS MISC Use as directed 100 each 3   Accu-Chek Softclix Lancets lancets Use as instructed tid before meals 100 each 12   atorvastatin (LIPITOR) 80 MG tablet Take 1 tablet (80 mg total) by mouth daily. 90 tablet 1   Blood Glucose Monitoring Suppl (ACCU-CHEK GUIDE) w/Device KIT Use as directed tid 1 kit 0   brimonidine (ALPHAGAN) 0.2 % ophthalmic solution Instill 1 drop into both eyes twice daily 15 mL 12   Continuous Blood Gluc Receiver (DEXCOM G6 RECEIVER) DEVI Use to check blood sugar three times daily. 1 each 0   Continuous Blood Gluc Sensor (DEXCOM G6 SENSOR) MISC Use to check blood sugar three times daily. Change sensor once every 10 days. 3 each 2   Continuous Blood Gluc Transmit (DEXCOM G6 TRANSMITTER) MISC Use to check blood sugar three times daily. Change transmitter once every 90 days. 1 each 1   diclofenac sodium (VOLTAREN) 1 % GEL Apply 2 g topically 4 (four) times daily. 300 g  3   dorzolamide-timolol (COSOPT) 22.3-6.8 MG/ML ophthalmic solution instill 1 drop into both eyes twice daily 15 mL 12   gabapentin (NEURONTIN) 300 MG capsule TAKE 1 CAPSULE (300 MG TOTAL) BY MOUTH AT BEDTIME. 90 capsule 1   glucose blood (ACCU-CHEK GUIDE) test strip Use as instructed tid 100 each 12   Insulin Glargine (BASAGLAR KWIKPEN) 100 UNIT/ML Inject 32 Units into the skin daily. 30 mL 6   Insulin Pen Needle (B-D UF III MINI PEN NEEDLES) 31G X 5 MM MISC USE AS DIRECTED 100 each 1   latanoprost (XALATAN) 0.005 % ophthalmic solution Instill 1 drop into both eyes every evening 7.5 mL 12   lisinopril (ZESTRIL) 20 MG tablet TAKE 1 TABLET (20 MG TOTAL) BY MOUTH DAILY. 90  tablet 1   metFORMIN (GLUCOPHAGE-XR) 500 MG 24 hr tablet TAKE 2 TABLETS (1,000 MG TOTAL) BY MOUTH DAILY WITH BREAKFAST. 180 tablet 1   MURO 128 5 % ophthalmic ointment SMARTSIG:1 Inch(es) In Eye(s) Every Evening     ondansetron (ZOFRAN-ODT) 8 MG disintegrating tablet Place 1 tablet twice a day by translingual route as needed for 3 days. 6 tablet 0   pantoprazole (PROTONIX) 20 MG tablet Take 2 tablets every day by mouth as directed for 30 days. 60 tablet 3   terbinafine (LAMISIL) 250 MG tablet Take 1 tablet (250 mg total) by mouth daily. 30 tablet 1   timolol (BETIMOL) 0.5 % ophthalmic solution Place 1 drop into both eyes at bedtime. 10 mL 3   trimethoprim-polymyxin b (POLYTRIM) ophthalmic solution Polytrim 10,000 unit-1 mg/mL eye drops  INSTILL 1 DROP INTO AFFECTED EYE(S) BY OPHTHALMIC ROUTE EVERY 6 HOURS     chlorthalidone (HYGROTON) 25 MG tablet Take 1 tablet (25 mg total) by mouth daily. (Patient not taking: Reported on 02/11/2022) 90 tablet 1   ciclopirox (PENLAC) 8 % solution Apply topically at bedtime. Apply over nail and surrounding skin. Apply daily over previous coat. After seven (7) days, may remove with alcohol and continue cycle. (Patient not taking: Reported on 02/11/2022) 6.6 mL 0   No facility-administered medications prior to visit.     ROS Review of Systems  Constitutional:  Negative for activity change and appetite change.  HENT:  Negative for sinus pressure and sore throat.   Respiratory:  Negative for chest tightness, shortness of breath and wheezing.   Cardiovascular:  Negative for chest pain and palpitations.  Gastrointestinal:  Negative for abdominal distention, abdominal pain and constipation.  Genitourinary: Negative.   Musculoskeletal: Negative.   Psychiatric/Behavioral:  Negative for behavioral problems and dysphoric mood.     Objective:  BP (!) 169/92 (BP Location: Right Arm, Patient Position: Sitting, Cuff Size: Normal)   Pulse 79   Temp 98.3 F (36.8 C)    Resp 12   Ht $R'5\' 3"'OY$  (1.6 m)   Wt 154 lb (69.9 kg)   SpO2 99%   BMI 27.28 kg/m      02/11/2022    2:09 PM 12/27/2021   10:28 AM 06/05/2021    9:22 AM  BP/Weight  Systolic BP 710 626 948  Diastolic BP 92 83 79  Wt. (Lbs) 154 154.6 148.2  BMI 27.28 kg/m2 27.39 kg/m2 26.25 kg/m2      Physical Exam Constitutional:      Appearance: She is well-developed.  Cardiovascular:     Rate and Rhythm: Normal rate.     Heart sounds: Normal heart sounds. No murmur heard. Pulmonary:     Effort: Pulmonary effort is normal.  Breath sounds: Normal breath sounds. No wheezing or rales.  Chest:     Chest wall: No tenderness.  Abdominal:     General: Bowel sounds are normal. There is no distension.     Palpations: Abdomen is soft. There is no mass.     Tenderness: There is no abdominal tenderness.  Musculoskeletal:        General: Normal range of motion.     Right lower leg: No edema.     Left lower leg: No edema.  Skin:    Comments: Dystrophic fingernail in superior two thirds of nail of right thumb and inferior third with slightly normal nail Thickened great toenails bilaterally.  Neurological:     Mental Status: She is alert and oriented to person, place, and time.  Psychiatric:        Mood and Affect: Mood normal.        Latest Ref Rng & Units 12/27/2021   11:20 AM 06/05/2021   10:10 AM 03/01/2021    9:02 AM  CMP  Glucose 70 - 99 mg/dL 86  111  107   BUN 8 - 27 mg/dL $Remove'17  27  18   'tRDWlXW$ Creatinine 0.57 - 1.00 mg/dL 1.14  1.08  1.03   Sodium 134 - 144 mmol/L 141  141  139   Potassium 3.5 - 5.2 mmol/L 4.9  4.6  4.4   Chloride 96 - 106 mmol/L 106  103  101   CO2 20 - 29 mmol/L $RemoveB'23  24  23   'wmNNHlPG$ Calcium 8.7 - 10.3 mg/dL 9.5  9.9  9.6   Total Protein 6.0 - 8.5 g/dL 6.7  7.2  6.9   Total Bilirubin 0.0 - 1.2 mg/dL 0.3  0.5  0.4   Alkaline Phos 44 - 121 IU/L 106  110  103   AST 0 - 40 IU/L $Remov'19  18  16   'UXnCFl$ ALT 0 - 32 IU/L $Remov'17  13  10     'cilvVs$ Lipid Panel     Component Value Date/Time   CHOL 183  12/27/2021 1120   TRIG 82 12/27/2021 1120   HDL 62 12/27/2021 1120   CHOLHDL 3.6 03/01/2021 0902   CHOLHDL 3.2 12/15/2013 1011   VLDL 26 12/15/2013 1011   LDLCALC 106 (H) 12/27/2021 1120    CBC    Component Value Date/Time   WBC 10.4 03/01/2021 0902   WBC 7.4 12/15/2013 1011   RBC 4.29 03/01/2021 0902   RBC 4.71 12/15/2013 1011   HGB 12.4 03/01/2021 0902   HCT 38.2 03/01/2021 0902   PLT 258 03/01/2021 0902   MCV 89 03/01/2021 0902   MCH 28.9 03/01/2021 0902   MCH 29.5 12/15/2013 1011   MCHC 32.5 03/01/2021 0902   MCHC 34.6 12/15/2013 1011   RDW 12.1 03/01/2021 0902   LYMPHSABS 2.6 03/01/2021 0902   MONOABS 0.4 12/15/2013 1011   EOSABS 0.1 03/01/2021 0902   BASOSABS 0.1 03/01/2021 0902    Lab Results  Component Value Date   HGBA1C 7.7 (A) 12/27/2021    Assessment & Plan:  1. Onychomycosis Advised that fungal infections are slow healing She does have one more refill of terbinafine and has been advised to pick this up.  2. Fingernail abnormalities This does seem to be onychomycosis as well We will refer to dermatology for nail biopsy Continue terbinafine - Ambulatory referral to Dermatology  3. Hypertension associated with diabetes ([place]) Uncontrolled She will check with the pharmacy to ensure she has picked up all  her antihypertensives and keep a log of her blood pressure at home. If blood pressures are elevated she needs to notify me so I can adjust her antihypertensive regimen.   No orders of the defined types were placed in this encounter.   Follow-up: Return in about 3 months (around 05/14/2022) for Chronic medical conditions.       Charlott Rakes, MD, FAAFP. [facility] and [place], [place]   02/11/2022, 2:36 PM

## 2022-02-11 NOTE — Patient Instructions (Signed)

## 2022-02-19 DIAGNOSIS — H43822 Vitreomacular adhesion, left eye: Secondary | ICD-10-CM | POA: Diagnosis not present

## 2022-02-19 DIAGNOSIS — H35032 Hypertensive retinopathy, left eye: Secondary | ICD-10-CM | POA: Diagnosis not present

## 2022-02-19 DIAGNOSIS — H1811 Bullous keratopathy, right eye: Secondary | ICD-10-CM | POA: Diagnosis not present

## 2022-02-19 DIAGNOSIS — E113312 Type 2 diabetes mellitus with moderate nonproliferative diabetic retinopathy with macular edema, left eye: Secondary | ICD-10-CM | POA: Diagnosis not present

## 2022-04-02 ENCOUNTER — Other Ambulatory Visit: Payer: Self-pay

## 2022-04-16 ENCOUNTER — Ambulatory Visit: Payer: Self-pay

## 2022-04-16 NOTE — Patient Outreach (Signed)
  Care Coordination   04/16/2022 Name: Tina Phelps MRN: [id number] DOB: Aug 02, 1945   Care Coordination Outreach Attempts:  A second unsuccessful outreach was attempted today to offer the patient with information about available care coordination services as a benefit of their health plan.     Follow Up Plan:  Additional outreach attempts will be made to offer the patient care coordination information and services.   Encounter Outcome:  No Answer  Care Coordination Interventions Activated:  No   Care Coordination Interventions:  No, not indicated    Daneen Schick, BSW, CDP Social Worker, Certified Dementia Practitioner [facility] Care Management  Care Coordination [phone]

## 2022-05-22 ENCOUNTER — Encounter: Payer: Self-pay | Admitting: Family Medicine

## 2022-05-22 ENCOUNTER — Ambulatory Visit: Payer: Medicare Other | Attending: Family Medicine | Admitting: Family Medicine

## 2022-05-22 ENCOUNTER — Other Ambulatory Visit: Payer: Self-pay

## 2022-05-22 VITALS — BP 185/94 | HR 83 | Ht 63.0 in | Wt 158.0 lb

## 2022-05-22 DIAGNOSIS — K219 Gastro-esophageal reflux disease without esophagitis: Secondary | ICD-10-CM | POA: Diagnosis not present

## 2022-05-22 DIAGNOSIS — Z9049 Acquired absence of other specified parts of digestive tract: Secondary | ICD-10-CM | POA: Insufficient documentation

## 2022-05-22 DIAGNOSIS — Z794 Long term (current) use of insulin: Secondary | ICD-10-CM | POA: Insufficient documentation

## 2022-05-22 DIAGNOSIS — E785 Hyperlipidemia, unspecified: Secondary | ICD-10-CM | POA: Insufficient documentation

## 2022-05-22 DIAGNOSIS — I152 Hypertension secondary to endocrine disorders: Secondary | ICD-10-CM | POA: Diagnosis not present

## 2022-05-22 DIAGNOSIS — E1159 Type 2 diabetes mellitus with other circulatory complications: Secondary | ICD-10-CM | POA: Insufficient documentation

## 2022-05-22 DIAGNOSIS — Z7984 Long term (current) use of oral hypoglycemic drugs: Secondary | ICD-10-CM | POA: Insufficient documentation

## 2022-05-22 DIAGNOSIS — B351 Tinea unguium: Secondary | ICD-10-CM | POA: Diagnosis not present

## 2022-05-22 DIAGNOSIS — R040 Epistaxis: Secondary | ICD-10-CM | POA: Diagnosis not present

## 2022-05-22 DIAGNOSIS — M1990 Unspecified osteoarthritis, unspecified site: Secondary | ICD-10-CM | POA: Diagnosis not present

## 2022-05-22 DIAGNOSIS — H409 Unspecified glaucoma: Secondary | ICD-10-CM | POA: Diagnosis not present

## 2022-05-22 DIAGNOSIS — Z79899 Other long term (current) drug therapy: Secondary | ICD-10-CM | POA: Diagnosis not present

## 2022-05-22 DIAGNOSIS — I10 Essential (primary) hypertension: Secondary | ICD-10-CM | POA: Diagnosis not present

## 2022-05-22 DIAGNOSIS — N281 Cyst of kidney, acquired: Secondary | ICD-10-CM | POA: Diagnosis not present

## 2022-05-22 DIAGNOSIS — Z905 Acquired absence of kidney: Secondary | ICD-10-CM | POA: Insufficient documentation

## 2022-05-22 DIAGNOSIS — E1169 Type 2 diabetes mellitus with other specified complication: Secondary | ICD-10-CM | POA: Diagnosis not present

## 2022-05-22 LAB — POCT GLYCOSYLATED HEMOGLOBIN (HGB A1C): HbA1c, POC (controlled diabetic range): 8.5 % — AB (ref 0.0–7.0)

## 2022-05-22 LAB — GLUCOSE, POCT (MANUAL RESULT ENTRY): POC Glucose: 377 mg/dl — AB (ref 70–99)

## 2022-05-22 MED ORDER — BASAGLAR KWIKPEN 100 UNIT/ML ~~LOC~~ SOPN
34.0000 [IU] | PEN_INJECTOR | Freq: Every day | SUBCUTANEOUS | 6 refills | Status: DC
  Filled 2022-05-22 – 2022-07-18 (×2): qty 30, 88d supply, fill #0

## 2022-05-22 MED ORDER — ATORVASTATIN CALCIUM 80 MG PO TABS
80.0000 mg | ORAL_TABLET | Freq: Every day | ORAL | 1 refills | Status: DC
  Filled 2022-05-22 – 2022-07-18 (×2): qty 90, 90d supply, fill #0

## 2022-05-22 MED ORDER — PANTOPRAZOLE SODIUM 40 MG PO TBEC
40.0000 mg | DELAYED_RELEASE_TABLET | Freq: Every day | ORAL | 1 refills | Status: DC
  Filled 2022-05-22 – 2022-08-02 (×2): qty 90, 90d supply, fill #0

## 2022-05-22 MED ORDER — TERBINAFINE HCL 250 MG PO TABS
250.0000 mg | ORAL_TABLET | Freq: Every day | ORAL | 1 refills | Status: DC
  Filled 2022-05-22: qty 30, 30d supply, fill #0

## 2022-05-22 MED ORDER — CHLORTHALIDONE 25 MG PO TABS
25.0000 mg | ORAL_TABLET | Freq: Every day | ORAL | 1 refills | Status: DC
  Filled 2022-05-22 – 2022-07-18 (×2): qty 90, 90d supply, fill #0
  Filled 2022-11-07: qty 90, 90d supply, fill #1

## 2022-05-22 NOTE — Progress Notes (Signed)
Nose bleeds in the morning Abdominal pain.

## 2022-05-22 NOTE — Patient Instructions (Signed)
Barrett's Esophagus  Barrett's esophagus occurs when the tissue that lines the esophagus changes or becomes damaged. The esophagus is the tube that carries food from the throat to the stomach. With Barrett's esophagus, the cells that line the esophagus are replaced by cells that are similar to the lining of the intestines (intestinal metaplasia). Barrett's esophagus itself may not cause any symptoms. However, many people who have Barrett's esophagus also have gastroesophageal reflux disease (GERD), which may cause symptoms such as heartburn. Over time, a few people with this condition may develop cancer of the esophagus. Treatment may include medicines, procedures to destroy the abnormal cells, or surgery. What are the causes? The exact cause of this condition is not known. In some cases, the condition develops from damage to the lining of the esophagus caused by gastroesophageal reflux disease (GERD). GERD occurs when stomach acids flow up from the stomach into the esophagus. Frequent symptoms of GERD may cause intestinal metaplasia or cause cell changes (dysplasia). What increases the risk? You are more likely to develop this condition if you: Have GERD. Are female. Are of European descent. Are obese. Are older than 50. Have a hiatal hernia. This is a condition in which part of your stomach bulges into your chest. Smoke. What are the signs or symptoms? People with Barrett's esophagus often have no symptoms. However, many people with this condition also have GERD. Symptoms of GERD may include: Heartburn. Difficulty swallowing. Dry cough. How is this diagnosed? This condition may be diagnosed based on: Results of an upper gastrointestinal endoscopy. For this exam, a thin, flexible tube with a light and a camera on the end (endoscope) is passed down your esophagus. Your health care provider can view the inside of your esophagus during this procedure. Results of a biopsy. For this procedure,  several tissue samples are removed (biopsy) from your esophagus to look at under a microscope. They are then checked for intestinal metaplasia or dysplasia. How is this treated? Treatment for this condition may include: Medicines (proton pump inhibitors, or PPIs) to decrease or stop GERD. Periodic endoscopic exams to make sure that cancer is not developing. A procedure or surgery for dysplasia. This may include: Removal or destruction of abnormal cells. Removal of part of the esophagus. Follow these instructions at home: Eating and drinking Eat more fruits and vegetables. Avoid fatty foods. Eat small, frequent meals instead of large meals. Avoid foods that cause heartburn. These foods include: Coffee and alcoholic drinks. Tomatoes and foods made with tomatoes. Greasy or spicy foods. Chocolate and peppermint. Do not drink alcohol. General instructions Take over-the-counter and prescription medicines only as told by your health care provider. Do not use any products that contain nicotine or tobacco, such as cigarettes, e-cigarettes, and chewing tobacco. If you need help quitting, ask your health care provider. If you are being treated for GERD, make sure you take medicines and follow all instructions as told by your health care provider. Keep all follow-up visits as told by your health care provider. This is important. Contact a health care provider if: You have heartburn or GERD symptoms. You have difficulty swallowing. Get help right away if: You have chest pain. You are unable to swallow. You vomit blood or material that looks like coffee grounds. Your stool (feces) is bright red or dark. These symptoms may represent a serious problem that is an emergency. Do not wait to see if the symptoms will go away. Get medical help right away. Call your local emergency services (911 in  the [place] Do not drive yourself to the hospital. Summary Barrett's esophagus occurs when the tissue that  lines the esophagus changes or becomes damaged. Barrett's esophagus may be diagnosed with an upper gastrointestinal endoscopy and a biopsy. Treatment may include medicines, procedures to remove abnormal cells, or surgery. Follow your health care provider's instructions about what to eat and drink, what medicines to take, and when to call for help. This information is not intended to replace advice given to you by your health care provider. Make sure you discuss any questions you have with your health care provider. Document Revised: 09/04/2019 Document Reviewed: 09/04/2019 Elsevier Patient Education  [place].

## 2022-05-22 NOTE — Progress Notes (Signed)
Subjective:  Patient ID: Tina Phelps, female    DOB: 1945/11/16  Age: 76 y.o. MRN: [id number]  CC: Diabetes   HPI Tina Phelps is a 76 y.o. year old female with a history of type 2 diabetes mellitus (A1c 8.5), hypertension, hyperlipidemia, osteoarthritis, GERD, s/p R nephrectomy (In [place] secondary to kidney cyst) who presents today accompanied by her daughter for a follow-up visit   Interval History:  BP at home was 123/82 but is elevated at 185/94 here in the clinic and she did not take her antihypertensives as she states she was in a hurry and got caught up with a lot going on at home.  In the mornings she wakes up with left sided epistaxis which has been intermittent.  Daughter is not sure if this is due to the fact that they have turned on the heat in the house.  Her blood sugars are usually around 108 but A1c is 8.5 up from 7.7 previously.  She has not been checking her random sugars.  Endorses adherence with her current diabetes regimen and increasing dose of metformin has been a challenge due to GI side effects.  She has no hypoglycemic episodes or neuropathy and has no visual concerns.  She Complains of abdominal discomfort with spicy foods and remains on a PPI but she takes this later in the day rather than early in the morning. The fungal infection on her nails is getting better.  She did have onychomycosis of her fingernails and toenails and completed a course of terbinafine. Past Medical History:  Diagnosis Date   Arthritis    Diabetes mellitus without complication ([place])    Glaucoma    Hypertension     Past Surgical History:  Procedure Laterality Date   CHOLECYSTECTOMY     NEPHRECTOMY Right    OOPHORECTOMY Right     Family History  Problem Relation Age of Onset   Diabetes Sister    Diabetes Brother    Diabetes Daughter     Social History   Socioeconomic History   Marital status: Single    Spouse name: Not on file   Number of children: Not on file   Years  of education: Not on file   Highest education level: Not on file  Occupational History   Not on file  Tobacco Use   Smoking status: Never   Smokeless tobacco: Never  Substance and Sexual Activity   Alcohol use: No   Drug use: No   Sexual activity: Never  Other Topics Concern   Not on file  Social History Narrative   Not on file   Social Determinants of Health   Financial Resource Strain: Not on file  Food Insecurity: Not on file  Transportation Needs: Not on file  Physical Activity: Not on file  Stress: Not on file  Social Connections: Not on file    No Known Allergies  Outpatient Medications Prior to Visit  Medication Sig Dispense Refill   ACCU-CHEK FASTCLIX LANCETS MISC Use as directed 100 each 3   Accu-Chek Softclix Lancets lancets Use as instructed tid before meals 100 each 12   Blood Glucose Monitoring Suppl (ACCU-CHEK GUIDE) w/Device KIT Use as directed tid 1 kit 0   brimonidine (ALPHAGAN) 0.2 % ophthalmic solution Instill 1 drop into both eyes twice daily 15 mL 12   ciclopirox (PENLAC) 8 % solution Apply topically at bedtime. Apply over nail and surrounding skin. Apply daily over previous coat. After seven (7) days, may remove with alcohol and  continue cycle. 6.6 mL 0   Continuous Blood Gluc Receiver (DEXCOM G6 RECEIVER) DEVI Use to check blood sugar three times daily. 1 each 0   Continuous Blood Gluc Sensor (DEXCOM G6 SENSOR) MISC Use to check blood sugar three times daily. Change sensor once every 10 days. 3 each 2   Continuous Blood Gluc Transmit (DEXCOM G6 TRANSMITTER) MISC Use to check blood sugar three times daily. Change transmitter once every 90 days. 1 each 1   diclofenac sodium (VOLTAREN) 1 % GEL Apply 2 g topically 4 (four) times daily. 300 g 3   dorzolamide-timolol (COSOPT) 22.3-6.8 MG/ML ophthalmic solution instill 1 drop into both eyes twice daily 15 mL 12   gabapentin (NEURONTIN) 300 MG capsule TAKE 1 CAPSULE (300 MG TOTAL) BY MOUTH AT BEDTIME. 90 capsule  1   glucose blood (ACCU-CHEK GUIDE) test strip Use as instructed tid 100 each 12   Insulin Pen Needle (B-D UF III MINI PEN NEEDLES) 31G X 5 MM MISC USE AS DIRECTED 100 each 1   latanoprost (XALATAN) 0.005 % ophthalmic solution Instill 1 drop into both eyes every evening 7.5 mL 12   lisinopril (ZESTRIL) 20 MG tablet TAKE 1 TABLET (20 MG TOTAL) BY MOUTH DAILY. 90 tablet 1   metFORMIN (GLUCOPHAGE-XR) 500 MG 24 hr tablet TAKE 2 TABLETS (1,000 MG TOTAL) BY MOUTH DAILY WITH BREAKFAST. 180 tablet 1   MURO 128 5 % ophthalmic ointment SMARTSIG:1 Inch(es) In Eye(s) Every Evening     ondansetron (ZOFRAN-ODT) 8 MG disintegrating tablet Place 1 tablet twice a day by translingual route as needed for 3 days. 6 tablet 0   timolol (BETIMOL) 0.5 % ophthalmic solution Place 1 drop into both eyes at bedtime. 10 mL 3   trimethoprim-polymyxin b (POLYTRIM) ophthalmic solution Polytrim 10,000 unit-1 mg/mL eye drops  INSTILL 1 DROP INTO AFFECTED EYE(S) BY OPHTHALMIC ROUTE EVERY 6 HOURS     atorvastatin (LIPITOR) 80 MG tablet Take 1 tablet (80 mg total) by mouth daily. 90 tablet 1   chlorthalidone (HYGROTON) 25 MG tablet Take 1 tablet (25 mg total) by mouth daily. 90 tablet 1   Insulin Glargine (BASAGLAR KWIKPEN) 100 UNIT/ML Inject 32 Units into the skin daily. 30 mL 6   pantoprazole (PROTONIX) 20 MG tablet Take 2 tablets every day by mouth as directed for 30 days. 60 tablet 3   terbinafine (LAMISIL) 250 MG tablet Take 1 tablet (250 mg total) by mouth daily. 30 tablet 1   No facility-administered medications prior to visit.     ROS Review of Systems  Constitutional:  Negative for activity change and appetite change.  HENT:  Positive for nosebleeds. Negative for sinus pressure and sore throat.   Respiratory:  Negative for chest tightness, shortness of breath and wheezing.   Cardiovascular:  Negative for chest pain and palpitations.  Gastrointestinal:  Negative for abdominal distention, abdominal pain and  constipation.  Genitourinary: Negative.   Musculoskeletal: Negative.   Psychiatric/Behavioral:  Negative for behavioral problems and dysphoric mood.     Objective:  BP (!) 185/94   Pulse 83   Ht 5' 3" (1.6 m)   Wt 158 lb (71.7 kg)   SpO2 99%   BMI 27.99 kg/m      05/22/2022    3:36 PM 02/11/2022    2:09 PM 12/27/2021   10:28 AM  BP/Weight  Systolic BP 951 884 166  Diastolic BP 94 92 83  Wt. (Lbs) 158 154 154.6  BMI 27.99 kg/m2 27.28 kg/m2 27.39 kg/m2  Physical Exam HENT:     Nose: Nose normal.  Cardiovascular:     Rate and Rhythm: Normal rate.     Heart sounds: Normal heart sounds. No murmur heard. Pulmonary:     Effort: Pulmonary effort is normal.     Breath sounds: Normal breath sounds. No wheezing or rales.  Chest:     Chest wall: No tenderness.  Abdominal:     General: Bowel sounds are normal. There is no distension.     Palpations: Abdomen is soft. There is no mass.     Tenderness: There is no abdominal tenderness.  Musculoskeletal:        General: Normal range of motion.     Right lower leg: No edema.     Left lower leg: No edema.  Neurological:     Mental Status: She is oriented to person, place, and time.  Psychiatric:        Mood and Affect: Mood normal.    Foot/Ankle Musculoskeletal Exam  Diabetic Foot Exam - Simple   Simple Foot Form Visual Inspection See comments: Yes Sensation Testing Intact to touch and monofilament testing bilaterally: Yes Pulse Check Posterior Tibialis and Dorsalis pulse intact bilaterally: Yes Comments Onychomycosis on big toenails bilaterally which is improving        Latest Ref Rng & Units 12/27/2021   11:20 AM 06/05/2021   10:10 AM 03/01/2021    9:02 AM  CMP  Glucose 70 - 99 mg/dL 86  111  107   BUN 8 - 27 mg/dL _0 Creatinine 0.57 - 1.00 mg/dL 1.14  1.08  1.03   Sodium 134 - 144 mmol/L 141  141  139   Potassium 3.5 - 5.2 mmol/L 4.9  4.6  4.4   Chloride 96 - 106 mmol/L 106  103  101   CO2 20 -  29 mmol/L _1 Calcium 8.7 - 10.3 mg/dL 9.5  9.9  9.6   Total Protein 6.0 - 8.5 g/dL 6.7  7.2  6.9   Total Bilirubin 0.0 - 1.2 mg/dL 0.3  0.5  0.4   Alkaline Phos 44 - 121 IU/L 106  110  103   AST 0 - 40 IU/L _2 ALT 0 - 32 IU/L _3 Lipid Panel     Component Value Date/Time   CHOL 183 12/27/2021 1120   TRIG 82 12/27/2021 1120   HDL 62 12/27/2021 1120   CHOLHDL 3.6 03/01/2021 0902   CHOLHDL 3.2 12/15/2013 1011   VLDL 26 12/15/2013 1011   LDLCALC 106 (H) 12/27/2021 1120    CBC    Component Value Date/Time   WBC 10.4 03/01/2021 0902   WBC 7.4 12/15/2013 1011   RBC 4.29 03/01/2021 0902   RBC 4.71 12/15/2013 1011   HGB 12.4 03/01/2021 0902   HCT 38.2 03/01/2021 0902   PLT 258 03/01/2021 0902   MCV 89 03/01/2021 0902   MCH 28.9 03/01/2021 0902   MCH 29.5 12/15/2013 1011   MCHC 32.5 03/01/2021 0902   MCHC 34.6 12/15/2013 1011   RDW 12.1 03/01/2021 0902   LYMPHSABS 2.6 03/01/2021 0902   MONOABS 0.4 12/15/2013 1011   EOSABS 0.1 03/01/2021 0902   BASOSABS 0.1 03/01/2021 0902    Lab Results  Component Value Date   HGBA1C 8.5 (A) 05/22/2022    Assessment & Plan:  1. Type 2 diabetes mellitus  with other specified complication, with long-term current use of insulin ([place]) Not fully optimized with A1c of 8.5 Advised to increase Basaglar by 2 units She will need to check random blood sugars Unfortunately GI adverse effects preclude maximizing dose of metformin Counseled on Diabetic diet, my plate method, 102 minutes of moderate intensity exercise/week Blood sugar logs with fasting goals of 80-120 mg/dl, random of less than 180 and in the event of sugars less than 60 mg/dl or greater than 400 mg/dl encouraged to notify the clinic. Advised on the need for annual eye exams, annual foot exams, Pneumonia vaccine. - POCT glycosylated hemoglobin (Hb A1C) - POCT glucose (manual entry) - Insulin Glargine (BASAGLAR KWIKPEN) 100 UNIT/ML; Inject 34 Units into  the skin daily.  Dispense: 30 mL; Refill: 6  2. Hyperlipidemia associated with type 2 diabetes mellitus ([facility]) Controlled Low-cholesterol diet - atorvastatin (LIPITOR) 80 MG tablet; Take 1 tablet (80 mg total) by mouth daily.  Dispense: 90 tablet; Refill: 1  3. Hypertension associated with diabetes ([place]) Uncontrolled due to the fact that she has not taken her antihypertensives today I will see her back at her next visit to reassess her blood pressure after she has been adherent with her antihypertensive Counseled on blood pressure goal of less than 130/80, low-sodium, DASH diet, medication compliance, 150 minutes of moderate intensity exercise per week. Discussed medication compliance, adverse effects. - chlorthalidone (HYGROTON) 25 MG tablet; Take 1 tablet (25 mg total) by mouth daily.  Dispense: 90 tablet; Refill: 1 - [id number]+EGFR  4. Onychomycosis Improving - terbinafine (LAMISIL) 250 MG tablet; Take 1 tablet (250 mg total) by mouth daily.  Dispense: 30 tablet; Refill: 1  5. Gastroesophageal reflux disease without esophagitis Advised to take this first thing in the morning Counseled on blood pressure goal of less than 130/80, low-sodium, DASH diet, medication compliance, 150 minutes of moderate intensity exercise per week. Discussed medication compliance, adverse effects. - pantoprazole (PROTONIX) 40 MG tablet; Take 1 tablet (40 mg total) by mouth daily.  Dispense: 90 tablet; Refill: 1  6. Epistaxis Likely sinus related versus the fact that she has put on the heat in the house Advised to use saline nose sprays, humidifier Elevated blood pressure can also be the culprit Advised that if symptoms are persistent I am happy to refer her to ENT   Meds ordered this encounter  Medications   pantoprazole (PROTONIX) 40 MG tablet    Sig: Take 1 tablet (40 mg total) by mouth daily.    Dispense:  90 tablet    Refill:  1   Insulin Glargine (BASAGLAR KWIKPEN) 100 UNIT/ML    Sig: Inject 34  Units into the skin daily.    Dispense:  30 mL    Refill:  6   atorvastatin (LIPITOR) 80 MG tablet    Sig: Take 1 tablet (80 mg total) by mouth daily.    Dispense:  90 tablet    Refill:  1    Dose increase   chlorthalidone (HYGROTON) 25 MG tablet    Sig: Take 1 tablet (25 mg total) by mouth daily.    Dispense:  90 tablet    Refill:  1   terbinafine (LAMISIL) 250 MG tablet    Sig: Take 1 tablet (250 mg total) by mouth daily.    Dispense:  30 tablet    Refill:  1    Follow-up: Return in about 6 weeks (around 07/03/2022) for Blood Pressure follow-up.       Charlott Rakes, MD, FAAFP.  [facility] [place], [place]   05/22/2022, 4:49 PM

## 2022-05-23 LAB — CMP14+EGFR
ALT: 12 IU/L (ref 0–32)
AST: 15 IU/L (ref 0–40)
Albumin/Globulin Ratio: 1.7 (ref 1.2–2.2)
Albumin: 4.2 g/dL (ref 3.8–4.8)
Alkaline Phosphatase: 127 IU/L — ABNORMAL HIGH (ref 44–121)
BUN/Creatinine Ratio: 14 (ref 12–28)
BUN: 17 mg/dL (ref 8–27)
Bilirubin Total: 0.3 mg/dL (ref 0.0–1.2)
CO2: 26 mmol/L (ref 20–29)
Calcium: 9.3 mg/dL (ref 8.7–10.3)
Chloride: 100 mmol/L (ref 96–106)
Creatinine, Ser: 1.19 mg/dL — ABNORMAL HIGH (ref 0.57–1.00)
Globulin, Total: 2.5 g/dL (ref 1.5–4.5)
Glucose: 362 mg/dL — ABNORMAL HIGH (ref 70–99)
Potassium: 4.2 mmol/L (ref 3.5–5.2)
Sodium: 137 mmol/L (ref 134–144)
Total Protein: 6.7 g/dL (ref 6.0–8.5)
eGFR: 48 mL/min/{1.73_m2} — ABNORMAL LOW (ref >59)

## 2022-05-29 ENCOUNTER — Other Ambulatory Visit: Payer: Self-pay

## 2022-07-11 ENCOUNTER — Ambulatory Visit: Payer: Medicare Other | Admitting: Family Medicine

## 2022-07-18 ENCOUNTER — Other Ambulatory Visit: Payer: Self-pay

## 2022-07-19 ENCOUNTER — Other Ambulatory Visit: Payer: Self-pay

## 2022-07-19 MED ORDER — BRIMONIDINE TARTRATE 0.2 % OP SOLN
1.0000 [drp] | Freq: Two times a day (BID) | OPHTHALMIC | 0 refills | Status: DC
  Filled 2022-07-19: qty 5, 30d supply, fill #0
  Filled 2022-08-02: qty 5, 25d supply, fill #0

## 2022-07-19 MED ORDER — DORZOLAMIDE HCL-TIMOLOL MAL 2-0.5 % OP SOLN
1.0000 [drp] | Freq: Two times a day (BID) | OPHTHALMIC | 0 refills | Status: DC
  Filled 2022-07-19 – 2022-08-02 (×2): qty 10, 50d supply, fill #0

## 2022-07-26 ENCOUNTER — Other Ambulatory Visit: Payer: Self-pay

## 2022-08-02 ENCOUNTER — Other Ambulatory Visit: Payer: Self-pay

## 2022-08-05 ENCOUNTER — Encounter (HOSPITAL_COMMUNITY): Payer: Self-pay

## 2022-08-05 ENCOUNTER — Other Ambulatory Visit: Payer: Self-pay

## 2022-08-05 ENCOUNTER — Inpatient Hospital Stay (HOSPITAL_COMMUNITY)
Admission: EM | Admit: 2022-08-05 | Discharge: 2022-08-08 | DRG: 384 | Disposition: A | Discharge status: Home / Self Care | Payer: Medicare Other | Attending: Internal Medicine | Admitting: Internal Medicine

## 2022-08-05 ENCOUNTER — Emergency Department (HOSPITAL_COMMUNITY): Payer: Medicare Other

## 2022-08-05 DIAGNOSIS — H409 Unspecified glaucoma: Secondary | ICD-10-CM | POA: Diagnosis present

## 2022-08-05 DIAGNOSIS — K920 Hematemesis: Secondary | ICD-10-CM | POA: Diagnosis not present

## 2022-08-05 DIAGNOSIS — R112 Nausea with vomiting, unspecified: Secondary | ICD-10-CM

## 2022-08-05 DIAGNOSIS — I700 Atherosclerosis of aorta: Secondary | ICD-10-CM | POA: Diagnosis present

## 2022-08-05 DIAGNOSIS — N179 Acute kidney failure, unspecified: Secondary | ICD-10-CM | POA: Diagnosis present

## 2022-08-05 DIAGNOSIS — E1122 Type 2 diabetes mellitus with diabetic chronic kidney disease: Secondary | ICD-10-CM | POA: Diagnosis present

## 2022-08-05 DIAGNOSIS — I10 Essential (primary) hypertension: Secondary | ICD-10-CM

## 2022-08-05 DIAGNOSIS — E1165 Type 2 diabetes mellitus with hyperglycemia: Secondary | ICD-10-CM | POA: Diagnosis not present

## 2022-08-05 DIAGNOSIS — Z7984 Long term (current) use of oral hypoglycemic drugs: Secondary | ICD-10-CM

## 2022-08-05 DIAGNOSIS — Z794 Long term (current) use of insulin: Secondary | ICD-10-CM | POA: Diagnosis not present

## 2022-08-05 DIAGNOSIS — N189 Chronic kidney disease, unspecified: Secondary | ICD-10-CM | POA: Diagnosis not present

## 2022-08-05 DIAGNOSIS — Z905 Acquired absence of kidney: Secondary | ICD-10-CM

## 2022-08-05 DIAGNOSIS — Z833 Family history of diabetes mellitus: Secondary | ICD-10-CM | POA: Diagnosis not present

## 2022-08-05 DIAGNOSIS — E785 Hyperlipidemia, unspecified: Secondary | ICD-10-CM | POA: Diagnosis present

## 2022-08-05 DIAGNOSIS — K269 Duodenal ulcer, unspecified as acute or chronic, without hemorrhage or perforation: Secondary | ICD-10-CM | POA: Diagnosis present

## 2022-08-05 DIAGNOSIS — K219 Gastro-esophageal reflux disease without esophagitis: Secondary | ICD-10-CM | POA: Diagnosis present

## 2022-08-05 DIAGNOSIS — E878 Other disorders of electrolyte and fluid balance, not elsewhere classified: Secondary | ICD-10-CM | POA: Diagnosis present

## 2022-08-05 DIAGNOSIS — R1013 Epigastric pain: Secondary | ICD-10-CM | POA: Diagnosis not present

## 2022-08-05 DIAGNOSIS — Z9049 Acquired absence of other specified parts of digestive tract: Secondary | ICD-10-CM

## 2022-08-05 DIAGNOSIS — D649 Anemia, unspecified: Secondary | ICD-10-CM | POA: Diagnosis present

## 2022-08-05 DIAGNOSIS — Z79899 Other long term (current) drug therapy: Secondary | ICD-10-CM | POA: Diagnosis not present

## 2022-08-05 DIAGNOSIS — D72829 Elevated white blood cell count, unspecified: Secondary | ICD-10-CM | POA: Diagnosis not present

## 2022-08-05 DIAGNOSIS — E876 Hypokalemia: Secondary | ICD-10-CM | POA: Diagnosis present

## 2022-08-05 DIAGNOSIS — R109 Unspecified abdominal pain: Secondary | ICD-10-CM | POA: Diagnosis not present

## 2022-08-05 DIAGNOSIS — E871 Hypo-osmolality and hyponatremia: Secondary | ICD-10-CM | POA: Diagnosis present

## 2022-08-05 DIAGNOSIS — K2980 Duodenitis without bleeding: Secondary | ICD-10-CM | POA: Diagnosis not present

## 2022-08-05 DIAGNOSIS — I129 Hypertensive chronic kidney disease with stage 1 through stage 4 chronic kidney disease, or unspecified chronic kidney disease: Secondary | ICD-10-CM | POA: Diagnosis present

## 2022-08-05 DIAGNOSIS — Z1152 Encounter for screening for COVID-19: Secondary | ICD-10-CM

## 2022-08-05 LAB — URINALYSIS, ROUTINE W REFLEX MICROSCOPIC
Bilirubin Urine: NEGATIVE
Glucose, UA: >=500 mg/dL — AB
Hgb urine dipstick: NEGATIVE
Ketones, ur: 20 mg/dL — AB
Nitrite: NEGATIVE
Protein, ur: NEGATIVE mg/dL
Specific Gravity, Urine: 1.024 (ref 1.005–1.030)
pH: 5 (ref 5.0–8.0)

## 2022-08-05 LAB — COMPREHENSIVE METABOLIC PANEL
ALT: 18 U/L (ref 0–44)
AST: 19 U/L (ref 15–41)
Albumin: 3.5 g/dL (ref 3.5–5.0)
Alkaline Phosphatase: 99 U/L (ref 38–126)
Anion gap: 15 (ref 5–15)
BUN: 43 mg/dL — ABNORMAL HIGH (ref 8–23)
CO2: 22 mmol/L (ref 22–32)
Calcium: 8.7 mg/dL — ABNORMAL LOW (ref 8.9–10.3)
Chloride: 94 mmol/L — ABNORMAL LOW (ref 98–111)
Creatinine, Ser: 1.33 mg/dL — ABNORMAL HIGH (ref 0.44–1.00)
GFR, Estimated: 42 mL/min — ABNORMAL LOW (ref >60)
Glucose, Bld: 407 mg/dL — ABNORMAL HIGH (ref 70–99)
Potassium: 3.9 mmol/L (ref 3.5–5.1)
Sodium: 131 mmol/L — ABNORMAL LOW (ref 135–145)
Total Bilirubin: 1.1 mg/dL (ref 0.3–1.2)
Total Protein: 7 g/dL (ref 6.5–8.1)

## 2022-08-05 LAB — CBC
HCT: 41.8 % (ref 36.0–46.0)
Hemoglobin: 14.1 g/dL (ref 12.0–15.0)
MCH: 29 pg (ref 26.0–34.0)
MCHC: 33.7 g/dL (ref 30.0–36.0)
MCV: 86 fL (ref 80.0–100.0)
Platelets: 282 10*3/uL (ref 150–400)
RBC: 4.86 MIL/uL (ref 3.87–5.11)
RDW: 12 % (ref 11.5–15.5)
WBC: 21.4 10*3/uL — ABNORMAL HIGH (ref 4.0–10.5)
nRBC: 0 % (ref 0.0–0.2)

## 2022-08-05 LAB — CBG MONITORING, ED
Glucose-Capillary: 377 mg/dL — ABNORMAL HIGH (ref 70–99)
Glucose-Capillary: 358 mg/dL — ABNORMAL HIGH (ref 70–99)

## 2022-08-05 LAB — RESP PANEL BY RT-PCR (RSV, FLU A&B, COVID)  RVPGX2
Influenza A by PCR: NEGATIVE
Influenza B by PCR: NEGATIVE
Resp Syncytial Virus by PCR: NEGATIVE
SARS Coronavirus 2 by RT PCR: NEGATIVE

## 2022-08-05 LAB — LIPASE, BLOOD: Lipase: 37 U/L (ref 11–51)

## 2022-08-05 MED ORDER — PANTOPRAZOLE SODIUM 40 MG IV SOLR
40.0000 mg | Freq: Once | INTRAVENOUS | Status: AC
  Administered 2022-08-05: 40 mg via INTRAVENOUS
  Filled 2022-08-05: qty 10

## 2022-08-05 MED ORDER — INSULIN ASPART 100 UNIT/ML IJ SOLN
10.0000 [IU] | Freq: Once | INTRAMUSCULAR | Status: AC
  Administered 2022-08-05: 10 [IU] via INTRAVENOUS

## 2022-08-05 MED ORDER — ACETAMINOPHEN 650 MG RE SUPP: 650.0000 mg | Freq: Four times a day (QID) | RECTAL | Status: DC | PRN

## 2022-08-05 MED ORDER — LACTATED RINGERS IV SOLN: INTRAVENOUS | Status: DC

## 2022-08-05 MED ORDER — LACTATED RINGERS IV BOLUS
1000.0000 mL | Freq: Once | INTRAVENOUS | Status: AC
  Administered 2022-08-05: 1000 mL via INTRAVENOUS

## 2022-08-05 MED ORDER — SODIUM CHLORIDE 0.9 % IV SOLN: INTRAVENOUS | Status: DC

## 2022-08-05 MED ORDER — ACETAMINOPHEN 325 MG PO TABS: 650.0000 mg | ORAL_TABLET | Freq: Four times a day (QID) | ORAL | Status: DC | PRN

## 2022-08-05 MED ORDER — PANTOPRAZOLE SODIUM 40 MG IV SOLR
40.0000 mg | Freq: Two times a day (BID) | INTRAVENOUS | Status: DC
  Administered 2022-08-05 – 2022-08-07 (×5): 40 mg via INTRAVENOUS
  Filled 2022-08-05 (×5): qty 10

## 2022-08-05 MED ORDER — ONDANSETRON HCL 4 MG/2ML IJ SOLN: 4.0000 mg | Freq: Four times a day (QID) | INTRAMUSCULAR | Status: DC | PRN

## 2022-08-05 MED ORDER — MAGNESIUM HYDROXIDE 400 MG/5ML PO SUSP: 30.0000 mL | Freq: Every day | ORAL | Status: DC | PRN

## 2022-08-05 MED ORDER — TRAZODONE HCL 50 MG PO TABS: 25.0000 mg | ORAL_TABLET | Freq: Every evening | ORAL | Status: DC | PRN

## 2022-08-05 MED ORDER — ONDANSETRON HCL 4 MG PO TABS: 4.0000 mg | ORAL_TABLET | Freq: Four times a day (QID) | ORAL | Status: DC | PRN

## 2022-08-05 MED ORDER — IOHEXOL 350 MG/ML SOLN
60.0000 mL | Freq: Once | INTRAVENOUS | Status: AC | PRN
  Administered 2022-08-05: 60 mL via INTRAVENOUS

## 2022-08-05 MED ORDER — SUCRALFATE 1 G PO TABS
1.0000 g | ORAL_TABLET | Freq: Three times a day (TID) | ORAL | Status: DC
  Administered 2022-08-05 – 2022-08-08 (×7): 1 g via ORAL
  Filled 2022-08-05 (×8): qty 1

## 2022-08-05 MED ORDER — ONDANSETRON 4 MG PO TBDP
4.0000 mg | ORAL_TABLET | Freq: Once | ORAL | Status: AC | PRN
  Administered 2022-08-05: 4 mg via ORAL
  Filled 2022-08-05: qty 1

## 2022-08-05 NOTE — ED Triage Notes (Signed)
Pt presents with N/V/D, abd pain x 4 days  with hyperglycemia.Yesterday family noted streaks of blood in the emesis. Pt with hx of DM.

## 2022-08-05 NOTE — ED Notes (Signed)
ED TO INPATIENT HANDOFF REPORT  ED Nurse Name and Phone #: Colletta [place] 31  S Name/Age/Gender Tina Phelps 77 y.o. female Room/Bed: 032C/032C  Code Status   Code Status: Full Code  Home/SNF/Other Home Patient oriented to: self, place, time, and situation Is this baseline? Yes   Triage Complete: Triage complete  Chief Complaint Acute duodenitis [K29.80]  Triage Note Pt presents with N/V/D, abd pain x 4 days  with hyperglycemia.Yesterday family noted streaks of blood in the emesis. Pt with hx of DM.    Allergies No Known Allergies  Level of Care/Admitting Diagnosis ED Disposition     ED Disposition  Admit   Condition  --   [place]: [place] [[id number]]  Level of Care: Med-Surg [16]  May admit patient to [facility] or [facility] if equivalent level of care is available:: No  Covid Evaluation: Asymptomatic - no recent exposure (last 10 days) testing not required  Diagnosis: Acute duodenitis [[id number]]  Admitting Physician: Christel Mormon [[id number]]  Attending Physician: Christel Mormon [[id number]]  Certification:: I certify this patient will need inpatient services for at least 2 midnights  Estimated Length of Stay: 2          B Medical/Surgery History Past Medical History:  Diagnosis Date   Arthritis    Diabetes mellitus without complication ([place])    Glaucoma    Hypertension    Past Surgical History:  Procedure Laterality Date   CHOLECYSTECTOMY     NEPHRECTOMY Right    OOPHORECTOMY Right      A IV Location/Drains/Wounds Patient Lines/Drains/Airways Status     Active Line/Drains/Airways     Name Placement date Placement time Site Days   Peripheral IV 08/05/22 20 G Right Antecubital 08/05/22  2101  Antecubital  less than 1            Intake/Output Last 24 hours No intake or output data in the 24 hours ending 08/05/22 2257  Labs/Imaging Results for orders placed or performed during the hospital encounter of  08/05/22 (from the past 65 hour(s))  CBG monitoring, ED     Status: Abnormal   Collection Time: 08/05/22  3:34 PM  Result Value Ref Range   Glucose-Capillary 377 (H) 70 - 99 mg/dL    Comment: Glucose reference range applies only to samples taken after fasting for at least 8 hours.   Comment 1 Notify RN    Comment 2 Document in Chart   Lipase, blood     Status: None   Collection Time: 08/05/22  5:25 PM  Result Value Ref Range   Lipase 37 11 - 51 U/L    Comment: Performed at [facility], [place] [address], [place], [place] [address]  Comprehensive metabolic panel     Status: Abnormal   Collection Time: 08/05/22  5:25 PM  Result Value Ref Range   Sodium 131 (L) 135 - 145 mmol/L   Potassium 3.9 3.5 - 5.1 mmol/L   Chloride 94 (L) 98 - 111 mmol/L   CO2 22 22 - 32 mmol/L   Glucose, Bld 407 (H) 70 - 99 mg/dL    Comment: Glucose reference range applies only to samples taken after fasting for at least 8 hours.   BUN 43 (H) 8 - 23 mg/dL   Creatinine, Ser 1.33 (H) 0.44 - 1.00 mg/dL   Calcium 8.7 (L) 8.9 - 10.3 mg/dL   Total Protein 7.0 6.5 - 8.1 g/dL   Albumin 3.5 3.5 -  5.0 g/dL   AST 19 15 - 41 U/L   ALT 18 0 - 44 U/L   Alkaline Phosphatase 99 38 - 126 U/L   Total Bilirubin 1.1 0.3 - 1.2 mg/dL   GFR, Estimated 42 (L) >60 mL/min    Comment: (NOTE) Calculated using the CKD-EPI Creatinine Equation (2021)    Anion gap 15 5 - 15    Comment: Performed at [facility] [address], [place], [place] [address]  CBC     Status: Abnormal   Collection Time: 08/05/22  5:25 PM  Result Value Ref Range   WBC 21.4 (H) 4.0 - 10.5 K/uL   RBC 4.86 3.87 - 5.11 MIL/uL   Hemoglobin 14.1 12.0 - 15.0 g/dL   HCT 41.8 36.0 - 46.0 %   MCV 86.0 80.0 - 100.0 fL   MCH 29.0 26.0 - 34.0 pg   MCHC 33.7 30.0 - 36.0 g/dL   RDW 12.0 11.5 - 15.5 %   Platelets 282 150 - 400 K/uL   nRBC 0.0 0.0 - 0.2 %    Comment: Performed at [facility], [place] [address] [place], [place] [address]  Urinalysis,  Routine w reflex microscopic -Urine, Clean Catch     Status: Abnormal   Collection Time: 08/05/22  5:55 PM  Result Value Ref Range   Color, Urine YELLOW YELLOW   APPearance HAZY (A) CLEAR   Specific Gravity, Urine 1.024 1.005 - 1.030   pH 5.0 5.0 - 8.0   Glucose, UA >=500 (A) NEGATIVE mg/dL   Hgb urine dipstick NEGATIVE NEGATIVE   Bilirubin Urine NEGATIVE NEGATIVE   Ketones, ur 20 (A) NEGATIVE mg/dL   Protein, ur NEGATIVE NEGATIVE mg/dL   Nitrite NEGATIVE NEGATIVE   Leukocytes,Ua MODERATE (A) NEGATIVE   RBC / HPF 0-5 0 - 5 RBC/hpf   WBC, UA 6-10 0 - 5 WBC/hpf   Bacteria, UA RARE (A) NONE SEEN   Squamous Epithelial / HPF 6-10 0 - 5 /HPF   Mucus PRESENT     Comment: Performed at [facility], [address] [place], [place] [address]  CBG monitoring, ED     Status: Abnormal   Collection Time: 08/05/22  8:41 PM  Result Value Ref Range   Glucose-Capillary 358 (H) 70 - 99 mg/dL    Comment: Glucose reference range applies only to samples taken after fasting for at least 8 hours.  Resp panel by RT-PCR (RSV, Flu A&B, Covid) Anterior Nasal Swab     Status: None   Collection Time: 08/05/22  9:22 PM   Specimen: Anterior Nasal Swab  Result Value Ref Range   SARS Coronavirus 2 by RT PCR NEGATIVE NEGATIVE   Influenza A by PCR NEGATIVE NEGATIVE   Influenza B by PCR NEGATIVE NEGATIVE    Comment: (NOTE) The Xpert Xpress SARS-CoV-2/FLU/RSV plus assay is intended as an aid in the diagnosis of influenza from Nasopharyngeal swab specimens and should not be used as a sole basis for treatment. Nasal washings and aspirates are unacceptable for Xpert Xpress SARS-CoV-2/FLU/RSV testing.  Fact Sheet for Patients: EntrepreneurPulse.com.au  Fact Sheet for Healthcare Providers: IncredibleEmployment.be  This test is not yet approved or cleared by the [place] FDA and has been authorized for detection and/or diagnosis of SARS-CoV-2 by FDA under an  Emergency Use Authorization (EUA). This EUA will remain in effect (meaning this test can be used) for the duration of the COVID-19 declaration under Section 564(b)(1) of the Act, 21 U.S.C. section  360bbb-3(b)(1), unless the authorization is terminated or revoked.     Resp Syncytial Virus by PCR NEGATIVE NEGATIVE    Comment: (NOTE) Fact Sheet for Patients: EntrepreneurPulse.com.au  Fact Sheet for Healthcare Providers: IncredibleEmployment.be  This test is not yet approved or cleared by the [place] FDA and has been authorized for detection and/or diagnosis of SARS-CoV-2 by FDA under an Emergency Use Authorization (EUA). This EUA will remain in effect (meaning this test can be used) for the duration of the COVID-19 declaration under Section 564(b)(1) of the Act, 21 U.S.C. section 360bbb-3(b)(1), unless the authorization is terminated or revoked.  Performed at [facility], [place] [address], [place], [place] [address]    CT Abdomen Pelvis W Contrast  Result Date: 08/05/2022 CLINICAL DATA:  Acute abdominal pain. EXAM: CT ABDOMEN AND PELVIS WITH CONTRAST TECHNIQUE: Multidetector CT imaging of the abdomen and pelvis was performed using the standard protocol following bolus administration of intravenous contrast. RADIATION DOSE REDUCTION: This exam was performed according to the departmental dose-optimization program which includes automated exposure control, adjustment of the mA and/or kV according to patient size and/or use of iterative reconstruction technique. CONTRAST:  58m OMNIPAQUE IOHEXOL 350 MG/ML SOLN COMPARISON:  None Available. FINDINGS: Patient's IV infiltrated during the contrast administration. As such, there is suboptimal contrast opacification of the intra-abdominal structures. Lower chest: No acute abnormality. Hepatobiliary: No focal liver abnormality is seen. Status post cholecystectomy. Pancreas: Unremarkable. No pancreatic ductal  dilatation or surrounding inflammatory changes. Spleen: Normal in size without focal abnormality. Adrenals/Urinary Tract: Adrenal glands are unremarkable. Solitary LEFT kidney is unremarkable without stone or hydronephrosis. No perinephric fluid. RIGHT kidney is absent. Stomach/Bowel: No dilated large or small bowel loops. Appendix is not convincingly seen but there are no inflammatory changes about the cecum to suggest acute appendicitis. Stomach is decompressed, limiting characterization. There is marked thickening of the walls of the descending and transverse portions of the duodenum, with associated inflammation/fluid stranding in the para duodenal fat. There is also at least mild thickening of the walls of the distal duodenum and proximal jejunum in the LEFT upper quadrant. Remainder of the small bowel appears normal, without additional site of bowel wall inflammation. Vascular/Lymphatic: Aortic atherosclerosis. No abdominal aortic aneurysm. No enlarged lymph nodes are seen in the abdomen or pelvis. Reproductive: Uterus and bilateral adnexa are unremarkable. Other: No abscess collection or free intraperitoneal air. Musculoskeletal: Osseous structures are unremarkable. IMPRESSION: 1. Marked thickening of the walls of the descending and transverse portions of the duodenum, with associated inflammation/fluid stranding in the adjacent paraduodenal fat. Findings are consistent with an acute duodenitis, moderate to severe in degree based on the appearance, most likely infectious or inflammatory in nature. Duodenal ulcer could also cause this appearance. 2. Solitary LEFT kidney is unremarkable without stone or hydronephrosis. RIGHT kidney is absent. Patient's LEFT upper arm IV infiltrated during the contrast administration. On physical exam of the LEFT upper extremity, there is no soft tissue tightness/firmness or skin redness identified. Patient denies significant pain. Patient's daughter was instructed to have her  mother keep her arm elevated and to use either cool or warm compresses at the infiltration site if patient was discharged from the ER tonight. Patient's daughter was also instructed to monitor the LEFT arm for swelling, tightness, redness, blistering or pain and to return the patient to the emergency room if any of these findings were noted. Emergency room physician was also informed of the contrast extravasation at the LEFT upper arm IV site. In case  patient will be admitted tonight, I entered the contrast extravasation order set via EPIC. Aortic Atherosclerosis (ICD10-I70.0). Electronically Signed   By: Franki Cabot M.D.   On: 08/05/2022 20:54    Pending Labs Unresulted Labs (From admission, onward)     Start     Ordered   08/06/22 2336  Basic metabolic panel  Tomorrow morning,   R        08/05/22 2152   08/06/22 0500  CBC  Tomorrow morning,   R        08/05/22 2152            Vitals/Pain Today's Vitals   08/05/22 1950 08/05/22 2130 08/05/22 2215 08/05/22 2224  BP:  (!) 150/69 (!) 154/69   Pulse: 84 86 84   Resp: (!) '21 17 17   '$ Temp:    97.8 F (36.6 C)  TempSrc:    Oral  SpO2: 100% 100% 100%   Weight:      Height:      PainSc:        Isolation Precautions Airborne and Contact precautions  Medications Medications  lactated ringers infusion ( Intravenous Not Given 08/05/22 1947)  0.9 %  sodium chloride infusion ( Intravenous New Bag/Given 08/05/22 2221)  acetaminophen (TYLENOL) tablet 650 mg (has no administration in time range)    Or  acetaminophen (TYLENOL) suppository 650 mg (has no administration in time range)  traZODone (DESYREL) tablet 25 mg (has no administration in time range)  magnesium hydroxide (MILK OF MAGNESIA) suspension 30 mL (has no administration in time range)  ondansetron (ZOFRAN) tablet 4 mg (has no administration in time range)    Or  ondansetron (ZOFRAN) injection 4 mg (has no administration in time range)  pantoprazole (PROTONIX) injection 40 mg  (40 mg Intravenous Given 08/05/22 2221)  sucralfate (CARAFATE) tablet 1 g (1 g Oral Given 08/05/22 2219)  ondansetron (ZOFRAN-ODT) disintegrating tablet 4 mg (4 mg Oral Given 08/05/22 1543)  lactated ringers bolus 1,000 mL (0 mLs Intravenous Stopped 08/05/22 2221)  insulin aspart (novoLOG) injection 10 Units (10 Units Intravenous Given 08/05/22 1946)  iohexol (OMNIPAQUE) 350 MG/ML injection 60 mL (60 mLs Intravenous Contrast Given 08/05/22 2024)  pantoprazole (PROTONIX) injection 40 mg (40 mg Intravenous Given 08/05/22 2117)    Mobility walks with person assist     Focused Assessments     R Recommendations: See Admitting Provider Note  Report given to:   Additional Notes:

## 2022-08-05 NOTE — ED Provider Notes (Signed)
[facility] Provider Note   CSN: [id number] Arrival date & time: 08/05/22  1437     History  No chief complaint on file.   Tina Phelps is a 77 y.o. female.  77 year old female presents with epigastric abdominal pain with associated nausea vomiting diarrhea.  Patient states that the emesis has been bloody at times.  Has noted small pieces of blood when she throws up.  Denies any black stools.  No fever or chills.  Abdominal pains are persistent and worse when she tries to eat.  Patient seen a doctor's office today and sent here.       Home Medications Prior to Admission medications   Medication Sig Start Date End Date Taking? Authorizing Provider  ACCU-CHEK FASTCLIX LANCETS MISC Use as directed 05/27/17   Alfonse Spruce, FNP  Accu-Chek Softclix Lancets lancets Use as instructed tid before meals 06/05/21   Charlott Rakes, MD  atorvastatin (LIPITOR) 80 MG tablet Take 1 tablet (80 mg total) by mouth daily. 05/22/22 05/22/23  Charlott Rakes, MD  Blood Glucose Monitoring Suppl (ACCU-CHEK GUIDE) w/Device KIT Use as directed tid 06/05/21   Charlott Rakes, MD  brimonidine (ALPHAGAN) 0.2 % ophthalmic solution Instill 1 drop into both eyes twice daily 06/04/21   Corey Harold, MD  brimonidine [facility]) 0.2 % ophthalmic solution Instill 1 drop into both eyes twice a day (Will send in additional refills after patient's yearly exam) 07/19/22     chlorthalidone (HYGROTON) 25 MG tablet Take 1 tablet (25 mg total) by mouth daily. 05/22/22   Charlott Rakes, MD  ciclopirox (PENLAC) 8 % solution Apply topically at bedtime. Apply over nail and surrounding skin. Apply daily over previous coat. After seven (7) days, may remove with alcohol and continue cycle. 12/21/19   Charlott Rakes, MD  Continuous Blood Gluc Receiver (DEXCOM G6 RECEIVER) DEVI Use to check blood sugar three times daily. 12/27/21   Charlott Rakes, MD  Continuous Blood Gluc Sensor (DEXCOM G6  SENSOR) MISC Use to check blood sugar three times daily. Change sensor once every 10 days. 12/27/21   Charlott Rakes, MD  Continuous Blood Gluc Transmit (DEXCOM G6 TRANSMITTER) MISC Use to check blood sugar three times daily. Change transmitter once every 90 days. 12/27/21   Charlott Rakes, MD  diclofenac sodium (VOLTAREN) 1 % GEL Apply 2 g topically 4 (four) times daily. 04/14/18   Charlott Rakes, MD  dorzolamide-timolol (COSOPT) 2-0.5 % ophthalmic solution Instill 1 drop into both eyes twice a day (Will send in additional refills after patient's yearly exam) 07/19/22     dorzolamide-timolol (COSOPT) 22.3-6.8 MG/ML ophthalmic solution instill 1 drop into both eyes twice daily 06/04/21   Corey Harold, MD  gabapentin (NEURONTIN) 300 MG capsule TAKE 1 CAPSULE (300 MG TOTAL) BY MOUTH AT BEDTIME. 12/27/21 12/27/22  Charlott Rakes, MD  glucose blood (ACCU-CHEK GUIDE) test strip Use as instructed tid 06/05/21   Charlott Rakes, MD  Insulin Glargine (BASAGLAR KWIKPEN) 100 UNIT/ML Inject 34 Units into the skin daily. 05/22/22 05/22/23  Charlott Rakes, MD  Insulin Pen Needle (B-D UF III MINI PEN NEEDLES) 31G X 5 MM MISC USE AS DIRECTED 03/01/21   Thereasa Solo, Dionne Bucy, PA-C  latanoprost (XALATAN) 0.005 % ophthalmic solution Instill 1 drop into both eyes every evening 06/04/21     lisinopril (ZESTRIL) 20 MG tablet TAKE 1 TABLET (20 MG TOTAL) BY MOUTH DAILY. 12/27/21 12/27/22  Charlott Rakes, MD  metFORMIN (GLUCOPHAGE-XR) 500 MG 24 hr tablet TAKE  2 TABLETS (1,000 MG TOTAL) BY MOUTH DAILY WITH BREAKFAST. 12/27/21 12/27/22  Charlott Rakes, MD  MURO 128 5 % ophthalmic ointment SMARTSIG:1 Inch(es) In Eye(s) Every Evening 11/19/19   [provider]  ondansetron (ZOFRAN-ODT) 8 MG disintegrating tablet Place 1 tablet twice a day by translingual route as needed for 3 days. 11/03/21     pantoprazole (PROTONIX) 40 MG tablet Take 1 tablet (40 mg total) by mouth daily. 05/22/22   Charlott Rakes, MD  terbinafine (LAMISIL) 250 MG  tablet Take 1 tablet (250 mg total) by mouth daily. 05/22/22   Charlott Rakes, MD  timolol (BETIMOL) 0.5 % ophthalmic solution Place 1 drop into both eyes at bedtime. 11/17/12   Rai, Vernelle Emerald, MD  trimethoprim-polymyxin b (POLYTRIM) ophthalmic solution Polytrim 10,000 unit-1 mg/mL eye drops  INSTILL 1 DROP INTO AFFECTED EYE(S) BY OPHTHALMIC ROUTE EVERY 6 HOURS 05/30/16   [provider]      Allergies    Patient has no known allergies.    Review of Systems   Review of Systems  All other systems reviewed and are negative.   Physical Exam Updated Vital Signs BP (!) 167/63   Pulse 84   Temp 98.4 F (36.9 C)   Resp (!) 21   Ht 1.6 m ('5\' 3"'$ )   Wt 64 kg   SpO2 100%   BMI 24.98 kg/m  Physical Exam Vitals and nursing note reviewed.  Constitutional:      General: She is not in acute distress.    Appearance: Normal appearance. She is well-developed. She is not toxic-appearing.  HENT:     Head: Normocephalic and atraumatic.  Eyes:     General: Lids are normal.     Conjunctiva/sclera: Conjunctivae normal.     Pupils: Pupils are equal, round, and reactive to light.  Neck:     Thyroid: No thyroid mass.     Trachea: No tracheal deviation.  Cardiovascular:     Rate and Rhythm: Normal rate and regular rhythm.     Heart sounds: Normal heart sounds. No murmur heard.    No gallop.  Pulmonary:     Effort: Pulmonary effort is normal. No respiratory distress.     Breath sounds: Normal breath sounds. No stridor. No decreased breath sounds, wheezing, rhonchi or rales.  Abdominal:     General: There is no distension.     Palpations: Abdomen is soft.     Tenderness: There is abdominal tenderness in the epigastric area. There is no rebound.    Musculoskeletal:        General: No tenderness. Normal range of motion.     Cervical back: Normal range of motion and neck supple.  Skin:    General: Skin is warm and dry.     Findings: No abrasion or rash.  Neurological:     Mental  Status: She is alert and oriented to person, place, and time. Mental status is at baseline.     GCS: GCS eye subscore is 4. GCS verbal subscore is 5. GCS motor subscore is 6.     Cranial Nerves: No cranial nerve deficit.     Sensory: No sensory deficit.     Motor: Motor function is intact.  Psychiatric:        Attention and Perception: Attention normal.        Speech: Speech normal.        Behavior: Behavior normal.     ED Results / Procedures / Treatments   Labs (all labs  ordered are listed, but only abnormal results are displayed) Labs Reviewed  COMPREHENSIVE METABOLIC PANEL - Abnormal; Notable for the following components:      Result Value   Sodium 131 (*)    Chloride 94 (*)    Glucose, Bld 407 (*)    BUN 43 (*)    Creatinine, Ser 1.33 (*)    Calcium 8.7 (*)    GFR, Estimated 42 (*)    All other components within normal limits  CBC - Abnormal; Notable for the following components:   WBC 21.4 (*)    All other components within normal limits  URINALYSIS, ROUTINE W REFLEX MICROSCOPIC - Abnormal; Notable for the following components:   APPearance HAZY (*)    Glucose, UA >=500 (*)    Ketones, ur 20 (*)    Leukocytes,Ua MODERATE (*)    Bacteria, UA RARE (*)    All other components within normal limits  CBG MONITORING, ED - Abnormal; Notable for the following components:   Glucose-Capillary 377 (*)    All other components within normal limits  CBG MONITORING, ED - Abnormal; Notable for the following components:   Glucose-Capillary 358 (*)    All other components within normal limits  RESP PANEL BY RT-PCR (RSV, FLU A&B, COVID)  [id number]  LIPASE, BLOOD    EKG None  Radiology No results found.  Procedures Procedures    Medications Ordered in ED Medications  lactated ringers infusion ( Intravenous Not Given 08/05/22 1947)  pantoprazole (PROTONIX) injection 40 mg (has no administration in time range)  ondansetron (ZOFRAN-ODT) disintegrating tablet 4 mg (4 mg Oral Given  08/05/22 1543)  lactated ringers bolus 1,000 mL (1,000 mLs Intravenous New Bag/Given 08/05/22 1946)  insulin aspart (novoLOG) injection 10 Units (10 Units Intravenous Given 08/05/22 1946)  iohexol (OMNIPAQUE) 350 MG/ML injection 60 mL (60 mLs Intravenous Contrast Given 08/05/22 2024)    ED Course/ Medical Decision Making/ A&P                             Medical Decision Making Amount and/or Complexity of Data Reviewed Labs: ordered. Radiology: ordered.  Risk Prescription drug management.   Patient given IV fluids here as well as Protonix after abdominal CT per my review interpretation shows evidence of severe duodenitis.  Significant leukocytosis noted on CBC white count 21,000.  These results were communicated to me by the radiologist.  Patient does appear to be dehydrated at this time as well to.  Patient's flecks of blood in her emesis likely from her duodenitis.  Patient remains uncomfortable at this time.  Will require admission.  Will consult hospitalist team       Final Clinical Impression(s) / ED Diagnoses Final diagnoses:  None    Rx / DC Orders ED Discharge Orders     None         Lacretia Leigh, MD 08/05/22 2132

## 2022-08-05 NOTE — ED Notes (Signed)
Pt called for MSE and no answer from pt.

## 2022-08-05 NOTE — ED Provider Triage Note (Signed)
Emergency Medicine Provider Triage Evaluation Note  Tina Phelps , a 77 y.o. female  was evaluated in triage.  Pt complains of 4 days of epigastric abdominal pain as well as nausea, vomiting, and diarrhea.  Patient states that at onset of symptoms she had several episodes of nonbloody diarrhea that day but diarrhea has since resolved.  She has continued to have multiple episodes of vomiting every time she attempts to eat or drink since onset and since last night she has had small pieces of bright red blood in her emesis.  No chest pain, shortness of breath, fever, chills, urinary symptoms.  Review of Systems  Positive: See HPI Negative: See HPI  Physical Exam  BP (!) 157/72 (BP Location: Right Arm)   Pulse 92   Temp 98.6 F (37 C)   Resp 16   Ht '5\' 3"'$  (1.6 m)   Wt 64 kg   SpO2 97%   BMI 24.98 kg/m  Gen:   Awake, no distress   Resp:  Normal effort lungs clear to auscultation MSK:   Moves extremities without difficulty no lower extremity edema Other:  Mild epigastric tenderness, remainder of abdomen is soft and nontender, patient neurologically intact, dry mucous membranes  Medical Decision Making  Medically screening exam initiated at 5:37 PM.  Appropriate orders placed.  Yunuen Bagg was informed that the remainder of the evaluation will be completed by another provider, this initial triage assessment does not replace that evaluation, and the importance of remaining in the ED until their evaluation is complete.     Suzzette Righter, PA-C 08/05/22 1738

## 2022-08-06 ENCOUNTER — Encounter (HOSPITAL_COMMUNITY): Payer: Self-pay | Admitting: Family Medicine

## 2022-08-06 DIAGNOSIS — E1165 Type 2 diabetes mellitus with hyperglycemia: Secondary | ICD-10-CM

## 2022-08-06 DIAGNOSIS — K2980 Duodenitis without bleeding: Secondary | ICD-10-CM | POA: Diagnosis not present

## 2022-08-06 DIAGNOSIS — E785 Hyperlipidemia, unspecified: Secondary | ICD-10-CM | POA: Insufficient documentation

## 2022-08-06 DIAGNOSIS — I10 Essential (primary) hypertension: Secondary | ICD-10-CM | POA: Insufficient documentation

## 2022-08-06 DIAGNOSIS — R112 Nausea with vomiting, unspecified: Secondary | ICD-10-CM

## 2022-08-06 DIAGNOSIS — N179 Acute kidney failure, unspecified: Secondary | ICD-10-CM

## 2022-08-06 DIAGNOSIS — R1013 Epigastric pain: Secondary | ICD-10-CM

## 2022-08-06 LAB — BASIC METABOLIC PANEL
Anion gap: 9 (ref 5–15)
BUN: 37 mg/dL — ABNORMAL HIGH (ref 8–23)
CO2: 22 mmol/L (ref 22–32)
Calcium: 8.5 mg/dL — ABNORMAL LOW (ref 8.9–10.3)
Chloride: 100 mmol/L (ref 98–111)
Creatinine, Ser: 1.2 mg/dL — ABNORMAL HIGH (ref 0.44–1.00)
GFR, Estimated: 48 mL/min — ABNORMAL LOW (ref >60)
Glucose, Bld: 227 mg/dL — ABNORMAL HIGH (ref 70–99)
Potassium: 3.5 mmol/L (ref 3.5–5.1)
Sodium: 131 mmol/L — ABNORMAL LOW (ref 135–145)

## 2022-08-06 LAB — CBC
HCT: 37.5 % (ref 36.0–46.0)
Hemoglobin: 13.2 g/dL (ref 12.0–15.0)
MCH: 29.5 pg (ref 26.0–34.0)
MCHC: 35.2 g/dL (ref 30.0–36.0)
MCV: 83.7 fL (ref 80.0–100.0)
Platelets: 269 10*3/uL (ref 150–400)
RBC: 4.48 MIL/uL (ref 3.87–5.11)
RDW: 12.2 % (ref 11.5–15.5)
WBC: 21.5 10*3/uL — ABNORMAL HIGH (ref 4.0–10.5)
nRBC: 0 % (ref 0.0–0.2)

## 2022-08-06 LAB — GLUCOSE, CAPILLARY
Glucose-Capillary: 213 mg/dL — ABNORMAL HIGH (ref 70–99)
Glucose-Capillary: 265 mg/dL — ABNORMAL HIGH (ref 70–99)
Glucose-Capillary: 211 mg/dL — ABNORMAL HIGH (ref 70–99)
Glucose-Capillary: 149 mg/dL — ABNORMAL HIGH (ref 70–99)
Glucose-Capillary: 241 mg/dL — ABNORMAL HIGH (ref 70–99)

## 2022-08-06 LAB — HEMOGLOBIN A1C
Hgb A1c MFr Bld: 9.5 % — ABNORMAL HIGH (ref 4.8–5.6)
Mean Plasma Glucose: 225.95 mg/dL

## 2022-08-06 MED ORDER — TIMOLOL HEMIHYDRATE 0.5 % OP SOLN: 1.0000 [drp] | Freq: Every day | OPHTHALMIC | Status: DC

## 2022-08-06 MED ORDER — INSULIN ASPART 100 UNIT/ML IJ SOLN
0.0000 [IU] | Freq: Three times a day (TID) | INTRAMUSCULAR | Status: DC
  Administered 2022-08-06: 3 [IU] via SUBCUTANEOUS
  Administered 2022-08-06 (×3): 7 [IU] via SUBCUTANEOUS
  Administered 2022-08-07 (×2): 4 [IU] via SUBCUTANEOUS
  Administered 2022-08-08: 3 [IU] via SUBCUTANEOUS

## 2022-08-06 MED ORDER — BRIMONIDINE TARTRATE 0.2 % OP SOLN
1.0000 [drp] | Freq: Two times a day (BID) | OPHTHALMIC | Status: DC
  Administered 2022-08-06 – 2022-08-08 (×5): 1 [drp] via OPHTHALMIC
  Filled 2022-08-06: qty 5

## 2022-08-06 MED ORDER — PROSOURCE PLUS PO LIQD: 30.0000 mL | Freq: Three times a day (TID) | ORAL | Status: DC

## 2022-08-06 MED ORDER — DORZOLAMIDE HCL-TIMOLOL MAL 2-0.5 % OP SOLN
1.0000 [drp] | Freq: Two times a day (BID) | OPHTHALMIC | Status: DC
  Filled 2022-08-06: qty 10

## 2022-08-06 MED ORDER — SODIUM CHLORIDE (HYPERTONIC) 5 % OP OINT
TOPICAL_OINTMENT | Freq: Every day | OPHTHALMIC | Status: DC
  Filled 2022-08-06 (×2): qty 3.5

## 2022-08-06 MED ORDER — DORZOLAMIDE HCL-TIMOLOL MAL 2-0.5 % OP SOLN
1.0000 [drp] | Freq: Two times a day (BID) | OPHTHALMIC | Status: DC
  Administered 2022-08-06 – 2022-08-08 (×5): 1 [drp] via OPHTHALMIC
  Filled 2022-08-06: qty 10

## 2022-08-06 MED ORDER — INSULIN GLARGINE-YFGN 100 UNIT/ML ~~LOC~~ SOLN
34.0000 [IU] | Freq: Every day | SUBCUTANEOUS | Status: DC
  Filled 2022-08-06: qty 0.34

## 2022-08-06 MED ORDER — ADULT MULTIVITAMIN W/MINERALS CH
1.0000 | ORAL_TABLET | Freq: Every day | ORAL | Status: DC
  Administered 2022-08-07 – 2022-08-08 (×2): 1 via ORAL
  Filled 2022-08-06 (×2): qty 1

## 2022-08-06 MED ORDER — BOOST / RESOURCE BREEZE PO LIQD CUSTOM
1.0000 | Freq: Three times a day (TID) | ORAL | Status: DC
  Administered 2022-08-06 – 2022-08-08 (×3): 1 via ORAL
  Filled 2022-08-06 (×2): qty 1

## 2022-08-06 MED ORDER — GABAPENTIN 300 MG PO CAPS
300.0000 mg | ORAL_CAPSULE | Freq: Every day | ORAL | Status: DC
  Administered 2022-08-07: 300 mg via ORAL
  Filled 2022-08-06: qty 1

## 2022-08-06 MED ORDER — LABETALOL HCL 5 MG/ML IV SOLN: 20.0000 mg | INTRAVENOUS | Status: DC | PRN

## 2022-08-06 MED ORDER — INSULIN GLARGINE-YFGN 100 UNIT/ML ~~LOC~~ SOLN
20.0000 [IU] | Freq: Every day | SUBCUTANEOUS | Status: DC
  Administered 2022-08-06 – 2022-08-08 (×3): 20 [IU] via SUBCUTANEOUS
  Filled 2022-08-06 (×3): qty 0.2

## 2022-08-06 MED ORDER — ATORVASTATIN CALCIUM 80 MG PO TABS
80.0000 mg | ORAL_TABLET | Freq: Every day | ORAL | Status: DC
  Administered 2022-08-07 – 2022-08-08 (×2): 80 mg via ORAL
  Filled 2022-08-06 (×3): qty 1

## 2022-08-06 MED ORDER — LATANOPROST 0.005 % OP SOLN
1.0000 [drp] | Freq: Every day | OPHTHALMIC | Status: DC
  Administered 2022-08-06 – 2022-08-07 (×2): 1 [drp] via OPHTHALMIC
  Filled 2022-08-06: qty 2.5

## 2022-08-06 NOTE — Progress Notes (Addendum)
PROGRESS NOTE    Tina Phelps  MIW:[id number] DOB: 1946/01/21 DOA: 08/05/2022 PCP: Charlott Rakes, MD   Brief Narrative:  Tina Brewbaker is a 77 y.o. [name] with medical history significant for type 2 diabetes mellitus, dyslipidemia, hypertension and osteoarthritis, who presented to the emergency room with acute onset of epigastric abdominal pain for the last 4 days which has been significantly worse with any type of food as well as recurrent vomiting.  She had associated diarrhea that lasted a couple of days.  She denies any melena or bright red bleeding per rectum.  She saw streaks of blood occasionally with vomitus.  No current bloody vomitus or hematemesis.  She denies any dysuria oliguria or hematuria or flank pain.  No chest pain or palpitations.  No cough or wheezing hemoptysis.  No other bleeding diathesis.   ED Course: When she came to the ER, BP was 157/72 with otherwise normal vital signs.  Labs revealed mild hyponatremia and hypochloremia, hyperglycemia 407, BUN of 43 and creatinine 1.33 compared to 17/1.19 on 05/22/2022 with otherwise unremarkable CMP.  CBC showed leukocytosis of 21.4.  Influenza antigens and COVID-19 PCR as well as RSV PCR came back negative.  UA showed more than 500 glucose 20 ketones and rare bacteria with 6-10 WBCs.  Assessment & Plan:   Principal Problem:   Acute duodenitis Active Problems:   Acute kidney injury superimposed on chronic kidney disease ([place])   Uncontrolled type 2 diabetes mellitus with hyperglycemia, with long-term current use of insulin (HCC)   Glaucoma   Dyslipidemia   Essential hypertension  Acute duodenitis/presumed duodenal ulcer: Feels better but still with epigastric tenderness.  GI consulted.  Plan for EGD either today or tomorrow based on the schedule.  Continue Protonix and Carafate in the meantime.  AKI on kidney stage IIIa: Baseline creatinine appears to be around 1.0, presented with 1.33.  Improved somewhat.  Continue IV  fluids.  Uncontrolled type 2 diabetes mellitus with hyperglycemia: Hemoglobin A1c 9.5.  She takes 34 units of Lantus at home.  Currently n.p.o., will give her 20 units now and continue SSI.  Essential hypertension: Blood pressure controlled, holding antihypertensives for now.  Dyslipidemia: Continue atorvastatin.  Acute hyponatremia: Very mild and asymptomatic.  Monitor for now.  DVT prophylaxis: SCDs Start: 08/05/22 2151   Code Status: Full Code  Family Communication: Daughter present at bedside.  Plan of care discussed with patient in length and he/she verbalized understanding and agreed with it.  Status is: Inpatient Remains inpatient appropriate because: Needs EGD   Estimated body mass index is 24.98 kg/m as calculated from the following:   Height as of this encounter: '5\' 3"'$  (1.6 m).   Weight as of this encounter: 64 kg.    Nutritional Assessment: Body mass index is 24.98 kg/m.Marland Kitchen Seen by dietician.  I agree with the assessment and plan as outlined below: Nutrition Status:        . Skin Assessment: I have examined the patient's skin and I agree with the wound assessment as performed by the wound care RN as outlined below:    Consultants:  GI  Procedures:  As above  Antimicrobials:  Anti-infectives (From admission, onward)    None         Subjective: Seen and examined, feels better than yesterday, still with some abdominal pain.  No nausea or any other complaint.  Objective: Vitals:   08/05/22 2224 08/06/22 0000 08/06/22 0500 08/06/22 0752  BP:  (!) 166/70 (!) 145/62 122/61  Pulse:  88 84 92  Resp:  '18 16 16  '$ Temp: 97.8 F (36.6 C) 98.4 F (36.9 C) 98 F (36.7 C) 98.2 F (36.8 C)  TempSrc: Oral Oral Oral Oral  SpO2:  100% 99% 94%  Weight:      Height:        Intake/Output Summary (Last 24 hours) at 08/06/2022 0939 Last data filed at 08/06/2022 0300 Gross per 24 hour  Intake 0 ml  Output --  Net 0 ml   Filed Weights   08/05/22 1531   Weight: 64 kg    Examination:  General exam: Appears calm and comfortable  Respiratory system: Clear to auscultation. Respiratory effort normal. Cardiovascular system: S1 & S2 heard, RRR. No JVD, murmurs, rubs, gallops or clicks. No pedal edema. Gastrointestinal system: Abdomen is nondistended, soft and mild to moderate epigastric tenderness. No organomegaly or masses felt. Normal bowel sounds heard. Central nervous system: Alert and oriented. No focal neurological deficits. Extremities: Symmetric 5 x 5 power. Skin: No rashes, lesions or ulcers Psychiatry: Judgement and insight appear normal. Mood & affect appropriate.    Data Reviewed: I have personally reviewed following labs and imaging studies  CBC: Recent Labs  Lab 08/05/22 1725 08/06/22 0308  WBC 21.4* 21.5*  HGB 14.1 13.2  HCT 41.8 37.5  MCV 86.0 83.7  PLT 282 710   Basic Metabolic Panel: Recent Labs  Lab 08/05/22 1725 08/06/22 0308  NA 131* 131*  K 3.9 3.5  CL 94* 100  CO2 22 22  GLUCOSE 407* 227*  BUN 43* 37*  CREATININE 1.33* 1.20*  CALCIUM 8.7* 8.5*   GFR: Estimated Creatinine Clearance: 37 mL/min (A) (by C-G formula based on SCr of 1.2 mg/dL (H)). Liver Function Tests: Recent Labs  Lab 08/05/22 1725  AST 19  ALT 18  ALKPHOS 99  BILITOT 1.1  PROT 7.0  ALBUMIN 3.5   Recent Labs  Lab 08/05/22 1725  LIPASE 37   No results for input(s): "AMMONIA" in the last 168 hours. Coagulation Profile: No results for input(s): "INR", "PROTIME" in the last 168 hours. Cardiac Enzymes: No results for input(s): "CKTOTAL", "CKMB", "CKMBINDEX", "TROPONINI" in the last 168 hours. BNP (last 3 results) No results for input(s): "PROBNP" in the last 8760 hours. HbA1C: Recent Labs    08/06/22 0308  HGBA1C 9.5*   CBG: Recent Labs  Lab 08/05/22 1534 08/05/22 2041 08/06/22 0000 08/06/22 0755  GLUCAP 377* 358* 265* 213*   Lipid Profile: No results for input(s): "CHOL", "HDL", "LDLCALC", "TRIG",  "CHOLHDL", "LDLDIRECT" in the last 72 hours. Thyroid Function Tests: No results for input(s): "TSH", "T4TOTAL", "FREET4", "T3FREE", "THYROIDAB" in the last 72 hours. Anemia Panel: No results for input(s): "VITAMINB12", "FOLATE", "FERRITIN", "TIBC", "IRON", "RETICCTPCT" in the last 72 hours. Sepsis Labs: No results for input(s): "PROCALCITON", "LATICACIDVEN" in the last 168 hours.  Recent Results (from the past 240 hour(s))  Resp panel by RT-PCR (RSV, Flu A&B, Covid) Anterior Nasal Swab     Status: None   Collection Time: 08/05/22  9:22 PM   Specimen: Anterior Nasal Swab  Result Value Ref Range Status   SARS Coronavirus 2 by RT PCR NEGATIVE NEGATIVE Final   Influenza A by PCR NEGATIVE NEGATIVE Final   Influenza B by PCR NEGATIVE NEGATIVE Final    Comment: (NOTE) The Xpert Xpress SARS-CoV-2/FLU/RSV plus assay is intended as an aid in the diagnosis of influenza from Nasopharyngeal swab specimens and should not be used as a sole basis for treatment. Nasal washings and aspirates  are unacceptable for Xpert Xpress SARS-CoV-2/FLU/RSV testing.  Fact Sheet for Patients: EntrepreneurPulse.com.au  Fact Sheet for Healthcare Providers: IncredibleEmployment.be  This test is not yet approved or cleared by the [place] FDA and has been authorized for detection and/or diagnosis of SARS-CoV-2 by FDA under an Emergency Use Authorization (EUA). This EUA will remain in effect (meaning this test can be used) for the duration of the COVID-19 declaration under Section 564(b)(1) of the Act, 21 U.S.C. section 360bbb-3(b)(1), unless the authorization is terminated or revoked.     Resp Syncytial Virus by PCR NEGATIVE NEGATIVE Final    Comment: (NOTE) Fact Sheet for Patients: EntrepreneurPulse.com.au  Fact Sheet for Healthcare Providers: IncredibleEmployment.be  This test is not yet approved or cleared by the [place] FDA  and has been authorized for detection and/or diagnosis of SARS-CoV-2 by FDA under an Emergency Use Authorization (EUA). This EUA will remain in effect (meaning this test can be used) for the duration of the COVID-19 declaration under Section 564(b)(1) of the Act, 21 U.S.C. section 360bbb-3(b)(1), unless the authorization is terminated or revoked.  Performed at [facility], [place] [address], [place], [place] [address]      Radiology Studies: CT Abdomen Pelvis W Contrast  Result Date: 08/05/2022 CLINICAL DATA:  Acute abdominal pain. EXAM: CT ABDOMEN AND PELVIS WITH CONTRAST TECHNIQUE: Multidetector CT imaging of the abdomen and pelvis was performed using the standard protocol following bolus administration of intravenous contrast. RADIATION DOSE REDUCTION: This exam was performed according to the departmental dose-optimization program which includes automated exposure control, adjustment of the mA and/or kV according to patient size and/or use of iterative reconstruction technique. CONTRAST:  47m OMNIPAQUE IOHEXOL 350 MG/ML SOLN COMPARISON:  None Available. FINDINGS: Patient's IV infiltrated during the contrast administration. As such, there is suboptimal contrast opacification of the intra-abdominal structures. Lower chest: No acute abnormality. Hepatobiliary: No focal liver abnormality is seen. Status post cholecystectomy. Pancreas: Unremarkable. No pancreatic ductal dilatation or surrounding inflammatory changes. Spleen: Normal in size without focal abnormality. Adrenals/Urinary Tract: Adrenal glands are unremarkable. Solitary LEFT kidney is unremarkable without stone or hydronephrosis. No perinephric fluid. RIGHT kidney is absent. Stomach/Bowel: No dilated large or small bowel loops. Appendix is not convincingly seen but there are no inflammatory changes about the cecum to suggest acute appendicitis. Stomach is decompressed, limiting characterization. There is marked thickening of the walls of  the descending and transverse portions of the duodenum, with associated inflammation/fluid stranding in the para duodenal fat. There is also at least mild thickening of the walls of the distal duodenum and proximal jejunum in the LEFT upper quadrant. Remainder of the small bowel appears normal, without additional site of bowel wall inflammation. Vascular/Lymphatic: Aortic atherosclerosis. No abdominal aortic aneurysm. No enlarged lymph nodes are seen in the abdomen or pelvis. Reproductive: Uterus and bilateral adnexa are unremarkable. Other: No abscess collection or free intraperitoneal air. Musculoskeletal: Osseous structures are unremarkable. IMPRESSION: 1. Marked thickening of the walls of the descending and transverse portions of the duodenum, with associated inflammation/fluid stranding in the adjacent paraduodenal fat. Findings are consistent with an acute duodenitis, moderate to severe in degree based on the appearance, most likely infectious or inflammatory in nature. Duodenal ulcer could also cause this appearance. 2. Solitary LEFT kidney is unremarkable without stone or hydronephrosis. RIGHT kidney is absent. Patient's LEFT upper arm IV infiltrated during the contrast administration. On physical exam of the LEFT upper extremity, there is no soft tissue tightness/firmness or skin redness identified. Patient denies significant  pain. Patient's daughter was instructed to have her mother keep her arm elevated and to use either cool or warm compresses at the infiltration site if patient was discharged from the ER tonight. Patient's daughter was also instructed to monitor the LEFT arm for swelling, tightness, redness, blistering or pain and to return the patient to the emergency room if any of these findings were noted. Emergency room physician was also informed of the contrast extravasation at the LEFT upper arm IV site. In case patient will be admitted tonight, I entered the contrast extravasation order set via  EPIC. Aortic Atherosclerosis (ICD10-I70.0). Electronically Signed   By: Franki Cabot M.D.   On: 08/05/2022 20:54    Scheduled Meds:  atorvastatin  80 mg Oral Daily   brimonidine  1 drop Both Eyes BID   dorzolamide-timolol  1 drop Both Eyes BID   gabapentin  300 mg Oral QHS   insulin aspart  0-20 Units Subcutaneous TID AC & HS   insulin glargine-yfgn  34 Units Subcutaneous Daily   latanoprost  1 drop Both Eyes QHS   pantoprazole (PROTONIX) IV  40 mg Intravenous Q12H   sodium chloride   Both Eyes QHS   sucralfate  1 g Oral TID WC & HS   Continuous Infusions:  sodium chloride 100 mL/hr at 08/05/22 2221     LOS: 1 day   Darliss Cheney, MD [facility]  08/06/2022, 9:39 AM   *Please note that this is a verbal dictation therefore any spelling or grammatical errors are due to the "[employer]" system interpretation.  Please page via [place] and do not message via secure chat for urgent patient care matters. Secure chat can be used for non urgent patient care matters.  How to contact the [facility] Attending or Consulting provider Section or covering provider during after hours [place], for this patient?  Check the care team in [facility] and look for a) attending/consulting [facility] provider listed and b) the [facility] team listed. Page or secure chat [phone]. Log into [email or web address] and use [place] universal password to access. If you do not have the password, please contact the hospital operator. Locate the [facility] provider you are looking for under [facility] and page to a number that you can be directly reached. If you still have difficulty reaching the provider, please page the [facility] (Director on Call) for the Hospitalists listed on amion for assistance.

## 2022-08-06 NOTE — Consult Note (Addendum)
Attending physician's note   I have taken a history, reviewed the chart, and examined the patient. I performed a substantive portion of this encounter, including complete performance of at least one of the key components, in conjunction with the APP. I agree with the APP's note, impression, and recommendations with my edits.   77 year old female with history of diabetes (A1c 9.5%), HTN, HLD, OA, GERD, admitted with epigastric pain and nausea/vomiting x 4 days.  Admission evaluation notable for the following:  - CT A/P: Marked thickening of the duodenum with associated inflammation/fluid stranding in the adjacent periduodenal fat consistent with acute moderate to severe duodenitis - WBC 21.5 otherwise normal CBC - Normal lipase - NA 131 - BUN/creatinine 37/1.2 (improving)  I discussed with her daughter by phone along with the patient and sister at bedside.  Overall feels like she is improving today.  We discussed the role/utility of EGD vs continued supportive care and medical management.  Given the clinical presentation, CT findings, and current clinical status, supportive care/medical management appropriate at this juncture.  If not improving tomorrow, can consider EGD to further evaluation for PUD, H. pylori, GOO, etc. - Continue high-dose PPI - Liquid diet for now and advance as tolerated - AKI management per primary inpatient service - GI service will continue to follow  Gerrit Heck, DO, FACG [phone] office           Consultation  Referring Provider:   [facility] Primary Care Physician:  Charlott Rakes, MD Primary Gastroenterologist:  Althia Forts       Reason for Consultation:     Epigastric abdominal pain   Impression    Acute duodenitis Associated leukocytosis, no anemia or evidence of GI bleeding. CT with moderate to severe degree based appearance most likely infectious versus inflammatory duodenal ulcer could also be this appearance. Daughter previously  diagnosed with H. pylori and treated. Patient with history of GERD for several years, no NSAIDs, no alcohol.  Uncontrolled type 2 diabetes mellitus  with hyperglycemia, with long-term current use of insulin ([facility]) Per primary  Acute kidney injury superimposed on chronic kidney disease ([place]) Per primary.   LOS: 1 day     Plan   With family history of H. pylori, strong suspicion for H. pylori duodenitis/peptic ulcer disease versus infectious process with leukocytosis. No need for endoscopy inpatient at this time unless patient does not improve, can consider outpatient. Continue supportive care with PPI IV twice daily Liquid diet Can do H pylori testing in our office with diathreix.   Thank you for your kind consultation, we will continue to follow.         HPI:   Tina Phelps is a 77 y.o. [name] with past medical history significant for type 2 diabetes, hyperlipidemia, hypertension presents to the ER with acute epigastric abdominal pain for 4 days worse with food with recurrent vomiting.  HD stable in the ER other than BP 157/72. Hyperglycemia 407, BUN 43, creatinine 1.33 compared to BUN of 17 and creatinine 1.19 on 05/22/2022. Leukocytosis 21.4, no anemia. Negative respiratory panel Urine with 500 glucose, 20 ketones rare bacteria and WBCs. CT abdomen pelvis with contrast showed marked thickening walls descending and transverse portion of the duodenum, associated inflammation, fluid stranding in the adjacent paraduodenal fat. Findings are consistent with an acute duodenitis, moderate to severe in degree based on the appearance, most likely infectious or inflammatory in nature. Duodenal ulcer could also cause this appearance. 2. Solitary LEFT kidney is unremarkable without  stone or hydronephrosis. RIGHT kidney is absent. 3.  Aortic atherosclerosis.  Patient with family at bedside, Daughter Tina Phelps, translated. Provided some of the history.  Daughter translates. Mother with  longstanding history of GERD for at least 2 to 3 years.  Attributed to the metformin which has been on for 2 to 3 years. Last 4 days worse epigastric pain associate with nausea vomiting decreased p.o. appetite. Last 2 days has had dark coffee appearing streaks in the blood, no hematemesis or coffee-ground emesis.  No melena or hematochezia. Patient has epigastric burning with water or food, no radiation to her back. Patient states her throats been hurting since vomiting but denies dysphagia. Denies fevers or chills. Normally has bowel movement daily, brown well-formed.  Had 1 day of loose stools 3 days ago, last bowel movement was 2 to 3 days ago. Patient status post cholecystectomy.  No family history of stomach cancer, stomach ulcers, colon cancer. Has never had colonoscopy. No alcohol, rare ibuprofen once a month for headaches, denies tobacco use or drug use. Daughter was diagnosed and treated with H. pylori several years ago after a trip to [place], patient has never been tested.   Abnormal ED labs: Abnormal Labs Reviewed  COMPREHENSIVE METABOLIC PANEL - Abnormal; Notable for the following components:      Result Value   Sodium 131 (*)    Chloride 94 (*)    Glucose, Bld 407 (*)    BUN 43 (*)    Creatinine, Ser 1.33 (*)    Calcium 8.7 (*)    GFR, Estimated 42 (*)    All other components within normal limits  CBC - Abnormal; Notable for the following components:   WBC 21.4 (*)    All other components within normal limits  URINALYSIS, ROUTINE W REFLEX MICROSCOPIC - Abnormal; Notable for the following components:   APPearance HAZY (*)    Glucose, UA >=500 (*)    Ketones, ur 20 (*)    Leukocytes,Ua MODERATE (*)    Bacteria, UA RARE (*)    All other components within normal limits  BASIC METABOLIC PANEL - Abnormal; Notable for the following components:   Sodium 131 (*)    Glucose, Bld 227 (*)    BUN 37 (*)    Creatinine, Ser 1.20 (*)    Calcium 8.5 (*)    GFR, Estimated 48 (*)     All other components within normal limits  CBC - Abnormal; Notable for the following components:   WBC 21.5 (*)    All other components within normal limits  HEMOGLOBIN A1C - Abnormal; Notable for the following components:   Hgb A1c MFr Bld 9.5 (*)    All other components within normal limits  GLUCOSE, CAPILLARY - Abnormal; Notable for the following components:   Glucose-Capillary 265 (*)    All other components within normal limits  GLUCOSE, CAPILLARY - Abnormal; Notable for the following components:   Glucose-Capillary 213 (*)    All other components within normal limits  CBG MONITORING, ED - Abnormal; Notable for the following components:   Glucose-Capillary 377 (*)    All other components within normal limits  CBG MONITORING, ED - Abnormal; Notable for the following components:   Glucose-Capillary 358 (*)    All other components within normal limits     Past Medical History:  Diagnosis Date   Arthritis    Diabetes mellitus without complication (HCC)    Glaucoma    Hypertension     Surgical History:  She  has a past surgical history that includes Cholecystectomy; Nephrectomy (Right); and Oophorectomy (Right). Family History:  Her family history includes Diabetes in her brother, daughter, and sister. Social History:   reports that she has never smoked. She has never used smokeless tobacco. She reports that she does not drink alcohol and does not use drugs.  Prior to Admission medications   Medication Sig Start Date End Date Taking? Authorizing Provider  atorvastatin (LIPITOR) 80 MG tablet Take 1 tablet (80 mg total) by mouth daily. 05/22/22 05/22/23 Yes Charlott Rakes, MD  brimonidine (ALPHAGAN) 0.2 % ophthalmic solution Instill 1 drop into both eyes twice a day (Will send in additional refills after patient's yearly exam) 07/19/22  Yes   chlorthalidone (HYGROTON) 25 MG tablet Take 1 tablet (25 mg total) by mouth daily. 05/22/22  Yes Charlott Rakes, MD  ciclopirox  (PENLAC) 8 % solution Apply topically at bedtime. Apply over nail and surrounding skin. Apply daily over previous coat. After seven (7) days, may remove with alcohol and continue cycle. 12/21/19  Yes Charlott Rakes, MD  diclofenac sodium (VOLTAREN) 1 % GEL Apply 2 g topically 4 (four) times daily. Patient taking differently: Apply 2 g topically daily as needed (for shoulder pain). 04/14/18  Yes Charlott Rakes, MD  dorzolamide-timolol (COSOPT) 2-0.5 % ophthalmic solution Instill 1 drop into both eyes twice a day (Will send in additional refills after patient's yearly exam) 07/19/22  Yes   gabapentin (NEURONTIN) 300 MG capsule TAKE 1 CAPSULE (300 MG TOTAL) BY MOUTH AT BEDTIME. Patient taking differently: Take 300 mg by mouth at bedtime. 12/27/21 12/27/22 Yes Charlott Rakes, MD  Insulin Glargine (BASAGLAR KWIKPEN) 100 UNIT/ML Inject 34 Units into the skin daily. 05/22/22 05/22/23 Yes Charlott Rakes, MD  latanoprost (XALATAN) 0.005 % ophthalmic solution Instill 1 drop into both eyes every evening 06/04/21  Yes   lisinopril (ZESTRIL) 20 MG tablet TAKE 1 TABLET (20 MG TOTAL) BY MOUTH DAILY. Patient taking differently: Take 20 mg by mouth daily. 12/27/21 12/27/22 Yes Newlin, Charlane Ferretti, MD  metFORMIN (GLUCOPHAGE-XR) 500 MG 24 hr tablet TAKE 2 TABLETS (1,000 MG TOTAL) BY MOUTH DAILY WITH BREAKFAST. 12/27/21 12/27/22 Yes Newlin, Charlane Ferretti, MD  MURO 128 5 % ophthalmic ointment Place 1 Application into both eyes every evening. 11/19/19  Yes [provider]  ondansetron (ZOFRAN-ODT) 8 MG disintegrating tablet Place 1 tablet twice a day by translingual route as needed for 3 days. 11/03/21  Yes   pantoprazole (PROTONIX) 40 MG tablet Take 1 tablet (40 mg total) by mouth daily. 05/22/22  Yes Charlott Rakes, MD  terbinafine (LAMISIL) 250 MG tablet Take 1 tablet (250 mg total) by mouth daily. 05/22/22  Yes Charlott Rakes, MD  ACCU-CHEK FASTCLIX LANCETS MISC Use as directed 05/27/17   Alfonse Spruce, FNP  Accu-Chek  Softclix Lancets lancets Use as instructed tid before meals 06/05/21   Charlott Rakes, MD  Blood Glucose Monitoring Suppl (ACCU-CHEK GUIDE) w/Device KIT Use as directed tid 06/05/21   Charlott Rakes, MD  Continuous Blood Gluc Receiver (DEXCOM G6 RECEIVER) DEVI Use to check blood sugar three times daily. 12/27/21   Charlott Rakes, MD  Continuous Blood Gluc Sensor (DEXCOM G6 SENSOR) MISC Use to check blood sugar three times daily. Change sensor once every 10 days. 12/27/21   Charlott Rakes, MD  Continuous Blood Gluc Transmit (DEXCOM G6 TRANSMITTER) MISC Use to check blood sugar three times daily. Change transmitter once every 90 days. 12/27/21   Charlott Rakes, MD  dorzolamide-timolol (COSOPT) 22.3-6.8 MG/ML ophthalmic solution instill  1 drop into both eyes twice daily Patient not taking: Reported on 08/06/2022 06/04/21   Corey Harold, MD  glucose blood (ACCU-CHEK GUIDE) test strip Use as instructed tid 06/05/21   Charlott Rakes, MD  Insulin Pen Needle (B-D UF III MINI PEN NEEDLES) 31G X 5 MM MISC USE AS DIRECTED 03/01/21   Argentina Donovan, PA-C  timolol (BETIMOL) 0.5 % ophthalmic solution Place 1 drop into both eyes at bedtime. Patient not taking: Reported on 08/06/2022 11/17/12   Mendel Corning, MD    Current Facility-Administered Medications  Medication Dose Route Frequency Provider Last Rate Last Admin   0.9 %  sodium chloride infusion   Intravenous Continuous Mansy, Jan A, MD 100 mL/hr at 08/05/22 2221 New Bag at 08/05/22 2221   acetaminophen (TYLENOL) tablet 650 mg  650 mg Oral Q6H PRN Mansy, Jan A, MD       Or   acetaminophen (TYLENOL) suppository 650 mg  650 mg Rectal Q6H PRN Mansy, Jan A, MD       atorvastatin (LIPITOR) tablet 80 mg  80 mg Oral Daily Mansy, Jan A, MD       brimonidine (ALPHAGAN) 0.2 % ophthalmic solution 1 drop  1 drop Both Eyes BID Mansy, Jan A, MD       dorzolamide-timolol (COSOPT) 2-0.5 % ophthalmic solution 1 drop  1 drop Both Eyes BID Mansy, Jan A, MD       gabapentin  (NEURONTIN) capsule 300 mg  300 mg Oral QHS Mansy, Jan A, MD       insulin aspart (novoLOG) injection 0-20 Units  0-20 Units Subcutaneous TID AC & HS Mansy, Jan A, MD       insulin glargine-yfgn [facility]) injection 34 Units  34 Units Subcutaneous Daily Mansy, Jan A, MD       labetalol (NORMODYNE) injection 20 mg  20 mg Intravenous Q3H PRN Mansy, Jan A, MD       latanoprost (XALATAN) 0.005 % ophthalmic solution 1 drop  1 drop Both Eyes QHS Mansy, Jan A, MD       magnesium hydroxide (MILK OF MAGNESIA) suspension 30 mL  30 mL Oral Daily PRN Mansy, Jan A, MD       ondansetron [facility]) tablet 4 mg  4 mg Oral Q6H PRN Mansy, Jan A, MD       Or   ondansetron [facility]) injection 4 mg  4 mg Intravenous Q6H PRN Mansy, Jan A, MD       pantoprazole (PROTONIX) injection 40 mg  40 mg Intravenous Q12H Mansy, Jan A, MD   40 mg at 08/05/22 2221   sodium chloride (MURO 128) 5 % ophthalmic ointment   Both Eyes QHS Mansy, Jan A, MD       sucralfate (CARAFATE) tablet 1 g  1 g Oral TID WC & HS Mansy, Jan A, MD   1 g at 08/05/22 2219   traZODone (DESYREL) tablet 25 mg  25 mg Oral QHS PRN Mansy, Jan A, MD        Allergies as of 08/05/2022   (No Known Allergies)    Review of Systems:    Constitutional: No weight loss, fever, chills, weakness or fatigue HEENT: Eyes: No change in vision               Ears, Nose, Throat:  No change in hearing or congestion Skin: No rash or itching Cardiovascular: No chest pain, chest pressure or palpitations   Respiratory: No SOB or cough Gastrointestinal: See  HPI and otherwise negative Genitourinary: No dysuria or change in urinary frequency Neurological: No headache, dizziness or syncope Musculoskeletal: No new muscle or joint pain Hematologic: No bleeding or bruising Psychiatric: No history of depression or anxiety     Physical Exam:  Vital signs in last 24 hours: Temp:  [97.8 F (36.6 C)-98.6 F (37 C)] 98.2 F (36.8 C) (02/06 0752) Pulse Rate:  [84-92] 92 (02/06  0752) Resp:  [16-21] 16 (02/06 0752) BP: (122-167)/(61-72) 122/61 (02/06 0752) SpO2:  [94 %-100 %] 94 % (02/06 0752) Weight:  [64 kg] 64 kg (02/05 1531) Last BM Date : 08/04/22 Last BM recorded by nurses in past 5 days No data recorded  General:   Pleasant, well developed female in no acute distress Head:  Normocephalic and atraumatic. Eyes: sclerae anicteric,conjunctive pink  Heart:  regular rate and rhythm Pulm: Clear anteriorly; no wheezing Abdomen:  Soft, Flat AB, Active bowel sounds. mild tenderness in the epigastrium. Without guarding and Without rebound, No organomegaly appreciated. Extremities:  Without edema. Msk:  Symmetrical without gross deformities. Peripheral pulses intact.  Neurologic:  Alert and  oriented x4;  No focal deficits.  Skin:   Dry and intact without significant lesions or rashes. Psychiatric:  Cooperative. Normal mood and affect.  LAB RESULTS: Recent Labs    08/05/22 1725 08/06/22 0308  WBC 21.4* 21.5*  HGB 14.1 13.2  HCT 41.8 37.5  PLT 282 269   BMET Recent Labs    08/05/22 1725 08/06/22 0308  NA 131* 131*  K 3.9 3.5  CL 94* 100  CO2 22 22  GLUCOSE 407* 227*  BUN 43* 37*  CREATININE 1.33* 1.20*  CALCIUM 8.7* 8.5*   LFT Recent Labs    08/05/22 1725  PROT 7.0  ALBUMIN 3.5  AST 19  ALT 18  ALKPHOS 99  BILITOT 1.1   PT/INR No results for input(s): "LABPROT", "INR" in the last 72 hours.  STUDIES: CT Abdomen Pelvis W Contrast  Result Date: 08/05/2022 CLINICAL DATA:  Acute abdominal pain. EXAM: CT ABDOMEN AND PELVIS WITH CONTRAST TECHNIQUE: Multidetector CT imaging of the abdomen and pelvis was performed using the standard protocol following bolus administration of intravenous contrast. RADIATION DOSE REDUCTION: This exam was performed according to the departmental dose-optimization program which includes automated exposure control, adjustment of the mA and/or kV according to patient size and/or use of iterative reconstruction  technique. CONTRAST:  59m OMNIPAQUE IOHEXOL 350 MG/ML SOLN COMPARISON:  None Available. FINDINGS: Patient's IV infiltrated during the contrast administration. As such, there is suboptimal contrast opacification of the intra-abdominal structures. Lower chest: No acute abnormality. Hepatobiliary: No focal liver abnormality is seen. Status post cholecystectomy. Pancreas: Unremarkable. No pancreatic ductal dilatation or surrounding inflammatory changes. Spleen: Normal in size without focal abnormality. Adrenals/Urinary Tract: Adrenal glands are unremarkable. Solitary LEFT kidney is unremarkable without stone or hydronephrosis. No perinephric fluid. RIGHT kidney is absent. Stomach/Bowel: No dilated large or small bowel loops. Appendix is not convincingly seen but there are no inflammatory changes about the cecum to suggest acute appendicitis. Stomach is decompressed, limiting characterization. There is marked thickening of the walls of the descending and transverse portions of the duodenum, with associated inflammation/fluid stranding in the para duodenal fat. There is also at least mild thickening of the walls of the distal duodenum and proximal jejunum in the LEFT upper quadrant. Remainder of the small bowel appears normal, without additional site of bowel wall inflammation. Vascular/Lymphatic: Aortic atherosclerosis. No abdominal aortic aneurysm. No enlarged lymph nodes are seen  in the abdomen or pelvis. Reproductive: Uterus and bilateral adnexa are unremarkable. Other: No abscess collection or free intraperitoneal air. Musculoskeletal: Osseous structures are unremarkable. IMPRESSION: 1. Marked thickening of the walls of the descending and transverse portions of the duodenum, with associated inflammation/fluid stranding in the adjacent paraduodenal fat. Findings are consistent with an acute duodenitis, moderate to severe in degree based on the appearance, most likely infectious or inflammatory in nature. Duodenal  ulcer could also cause this appearance. 2. Solitary LEFT kidney is unremarkable without stone or hydronephrosis. RIGHT kidney is absent. Patient's LEFT upper arm IV infiltrated during the contrast administration. On physical exam of the LEFT upper extremity, there is no soft tissue tightness/firmness or skin redness identified. Patient denies significant pain. Patient's daughter was instructed to have her mother keep her arm elevated and to use either cool or warm compresses at the infiltration site if patient was discharged from the ER tonight. Patient's daughter was also instructed to monitor the LEFT arm for swelling, tightness, redness, blistering or pain and to return the patient to the emergency room if any of these findings were noted. Emergency room physician was also informed of the contrast extravasation at the LEFT upper arm IV site. In case patient will be admitted tonight, I entered the contrast extravasation order set via EPIC. Aortic Atherosclerosis (ICD10-I70.0). Electronically Signed   By: Franki Cabot M.D.   On: 08/05/2022 20:54     Vladimir Crofts  08/06/2022, 8:36 AM

## 2022-08-06 NOTE — H&P (Signed)
[place]   PATIENT NAME: Tina Phelps    MR#:  [id number]  DATE OF BIRTH:  1946/02/10  DATE OF ADMISSION:  08/05/2022  PRIMARY CARE PHYSICIAN: Charlott Rakes, MD   Patient is coming from: Home  REQUESTING/REFERRING PHYSICIAN: Lacretia Leigh, MD   CHIEF COMPLAINT:  Epigastric abdominal pain.   HISTORY OF PRESENT ILLNESS:  Tina Phelps is a 77 y.o. [name] with medical history significant for type 2 diabetes mellitus, dyslipidemia, hypertension and osteoarthritis, who presented to the emergency room with acute onset of epigastric abdominal pain for the last 4 days which has been significantly worse with any type of food as well as recurrent vomiting.  She had associated diarrhea that lasted a couple of days.  She denies any melena or bright red bleeding per rectum.  She saw streaks of blood occasionally with vomitus.  No current bloody vomitus or hematemesis.  She denies any dysuria oliguria or hematuria or flank pain.  No chest pain or palpitations.  No cough or wheezing hemoptysis.  No other bleeding diathesis.  ED Course: When she came to the ER, BP was 157/72 with otherwise normal vital signs.  Labs revealed mild hyponatremia and hypochloremia, hyperglycemia 407, BUN of 43 and creatinine 1.33 compared to 17/1.19 on 05/22/2022 with otherwise unremarkable CMP.  CBC showed leukocytosis of 21.4.  Influenza antigens and COVID-19 PCR as well as RSV PCR came back negative.  UA showed more than 500 glucose 20 ketones and rare bacteria with 6-10 WBCs. EKG as reviewed by me : Pending. Imaging: Abdominal and pelvic CT scan showed the following: 1. Marked thickening of the walls of the descending and transverse portions of the duodenum, with associated inflammation/fluid stranding in the adjacent paraduodenal fat. Findings are consistent with an acute duodenitis, moderate to severe in degree based on the appearance, most likely infectious or inflammatory in nature. Duodenal ulcer  could also cause this appearance. 2. Solitary LEFT kidney is unremarkable without stone or hydronephrosis. RIGHT kidney is absent. 3.  Aortic atherosclerosis.  The patient was given IV Protonix.  She will be admitted to medical bed for further evaluation and management.  PAST MEDICAL HISTORY:   Past Medical History:  Diagnosis Date   Arthritis    Diabetes mellitus without complication (HCC)    Glaucoma    Hypertension   -Dyslipidemia  PAST SURGICAL HISTORY:   Past Surgical History:  Procedure Laterality Date   CHOLECYSTECTOMY     NEPHRECTOMY Right    OOPHORECTOMY Right     SOCIAL HISTORY:   Social History   Tobacco Use   Smoking status: Never   Smokeless tobacco: Never  Substance Use Topics   Alcohol use: No    FAMILY HISTORY:   Family History  Problem Relation Age of Onset   Diabetes Sister    Diabetes Brother    Diabetes Daughter     DRUG ALLERGIES:  No Known Allergies  REVIEW OF SYSTEMS:   ROS As per history of present illness. All pertinent systems were reviewed above. Constitutional, HEENT, cardiovascular, respiratory, GI, GU, musculoskeletal, neuro, psychiatric, endocrine, integumentary and hematologic systems were reviewed and are otherwise negative/unremarkable except for positive findings mentioned above in the HPI.   MEDICATIONS AT HOME:   Prior to Admission medications   Medication Sig Start Date End Date Taking? Authorizing Provider  ACCU-CHEK FASTCLIX LANCETS MISC Use as directed 05/27/17   Alfonse Spruce, FNP  Accu-Chek Softclix Lancets lancets Use as instructed tid before meals 06/05/21  Charlott Rakes, MD  atorvastatin (LIPITOR) 80 MG tablet Take 1 tablet (80 mg total) by mouth daily. 05/22/22 05/22/23  Charlott Rakes, MD  Blood Glucose Monitoring Suppl (ACCU-CHEK GUIDE) w/Device KIT Use as directed tid 06/05/21   Charlott Rakes, MD  brimonidine (ALPHAGAN) 0.2 % ophthalmic solution Instill 1 drop into both eyes twice daily 06/04/21    Corey Harold, MD  brimonidine [facility]) 0.2 % ophthalmic solution Instill 1 drop into both eyes twice a day (Will send in additional refills after patient's yearly exam) 07/19/22     chlorthalidone (HYGROTON) 25 MG tablet Take 1 tablet (25 mg total) by mouth daily. 05/22/22   Charlott Rakes, MD  ciclopirox (PENLAC) 8 % solution Apply topically at bedtime. Apply over nail and surrounding skin. Apply daily over previous coat. After seven (7) days, may remove with alcohol and continue cycle. 12/21/19   Charlott Rakes, MD  Continuous Blood Gluc Receiver (DEXCOM G6 RECEIVER) DEVI Use to check blood sugar three times daily. 12/27/21   Charlott Rakes, MD  Continuous Blood Gluc Sensor (DEXCOM G6 SENSOR) MISC Use to check blood sugar three times daily. Change sensor once every 10 days. 12/27/21   Charlott Rakes, MD  Continuous Blood Gluc Transmit (DEXCOM G6 TRANSMITTER) MISC Use to check blood sugar three times daily. Change transmitter once every 90 days. 12/27/21   Charlott Rakes, MD  diclofenac sodium (VOLTAREN) 1 % GEL Apply 2 g topically 4 (four) times daily. 04/14/18   Charlott Rakes, MD  dorzolamide-timolol (COSOPT) 2-0.5 % ophthalmic solution Instill 1 drop into both eyes twice a day (Will send in additional refills after patient's yearly exam) 07/19/22     dorzolamide-timolol (COSOPT) 22.3-6.8 MG/ML ophthalmic solution instill 1 drop into both eyes twice daily 06/04/21   Corey Harold, MD  gabapentin (NEURONTIN) 300 MG capsule TAKE 1 CAPSULE (300 MG TOTAL) BY MOUTH AT BEDTIME. 12/27/21 12/27/22  Charlott Rakes, MD  glucose blood (ACCU-CHEK GUIDE) test strip Use as instructed tid 06/05/21   Charlott Rakes, MD  Insulin Glargine (BASAGLAR KWIKPEN) 100 UNIT/ML Inject 34 Units into the skin daily. 05/22/22 05/22/23  Charlott Rakes, MD  Insulin Pen Needle (B-D UF III MINI PEN NEEDLES) 31G X 5 MM MISC USE AS DIRECTED 03/01/21   Thereasa Solo, Dionne Bucy, PA-C  latanoprost (XALATAN) 0.005 % ophthalmic solution Instill 1  drop into both eyes every evening 06/04/21     lisinopril (ZESTRIL) 20 MG tablet TAKE 1 TABLET (20 MG TOTAL) BY MOUTH DAILY. 12/27/21 12/27/22  Charlott Rakes, MD  metFORMIN (GLUCOPHAGE-XR) 500 MG 24 hr tablet TAKE 2 TABLETS (1,000 MG TOTAL) BY MOUTH DAILY WITH BREAKFAST. 12/27/21 12/27/22  Charlott Rakes, MD  MURO 128 5 % ophthalmic ointment SMARTSIG:1 Inch(es) In Eye(s) Every Evening 11/19/19   [provider]  ondansetron (ZOFRAN-ODT) 8 MG disintegrating tablet Place 1 tablet twice a day by translingual route as needed for 3 days. 11/03/21     pantoprazole (PROTONIX) 40 MG tablet Take 1 tablet (40 mg total) by mouth daily. 05/22/22   Charlott Rakes, MD  terbinafine (LAMISIL) 250 MG tablet Take 1 tablet (250 mg total) by mouth daily. 05/22/22   Charlott Rakes, MD  timolol (BETIMOL) 0.5 % ophthalmic solution Place 1 drop into both eyes at bedtime. 11/17/12   Rai, Vernelle Emerald, MD  trimethoprim-polymyxin b (POLYTRIM) ophthalmic solution Polytrim 10,000 unit-1 mg/mL eye drops  INSTILL 1 DROP INTO AFFECTED EYE(S) BY OPHTHALMIC ROUTE EVERY 6 HOURS 05/30/16   [provider]  VITAL SIGNS:  Blood pressure (!) 166/70, pulse 88, temperature 98.4 F (36.9 C), temperature source Oral, resp. rate 18, height '5\' 3"'$  (1.6 m), weight 64 kg, SpO2 100 %.  PHYSICAL EXAMINATION:  Physical Exam  GENERAL:  77 y.o.-year-old African American female patient lying in the bed with no acute distress.  EYES: Pupils equal, round, reactive to light and accommodation. No scleral icterus. Extraocular muscles intact.  HEENT: Head atraumatic, normocephalic. Oropharynx and nasopharynx clear.  NECK:  Supple, no jugular venous distention. No thyroid enlargement, no tenderness.  LUNGS: Normal breath sounds bilaterally, no wheezing, rales,rhonchi or crepitation. No use of accessory muscles of respiration.  CARDIOVASCULAR: Regular rate and rhythm, S1, S2 normal. No murmurs, rubs, or gallops.  ABDOMEN: Soft,  nondistended, with gastric tenderness without rebound tenderness guarding or rigidity.. Bowel sounds present. No organomegaly or mass.  EXTREMITIES: No pedal edema, cyanosis, or clubbing.  NEUROLOGIC: Cranial nerves II through XII are intact. Muscle strength 5/5 in all extremities. Sensation intact. Gait not checked.  PSYCHIATRIC: The patient is alert and oriented x 3.  Normal affect and good eye contact. SKIN: No obvious rash, lesion, or ulcer.   LABORATORY PANEL:   CBC Recent Labs  Lab 08/05/22 1725  WBC 21.4*  HGB 14.1  HCT 41.8  PLT 282   ------------------------------------------------------------------------------------------------------------------  Chemistries  Recent Labs  Lab 08/05/22 1725  NA 131*  K 3.9  CL 94*  CO2 22  GLUCOSE 407*  BUN 43*  CREATININE 1.33*  CALCIUM 8.7*  AST 19  ALT 18  ALKPHOS 99  BILITOT 1.1   ------------------------------------------------------------------------------------------------------------------  Cardiac Enzymes No results for input(s): "TROPONINI" in the last 168 hours. ------------------------------------------------------------------------------------------------------------------  RADIOLOGY:  CT Abdomen Pelvis W Contrast  Result Date: 08/05/2022 CLINICAL DATA:  Acute abdominal pain. EXAM: CT ABDOMEN AND PELVIS WITH CONTRAST TECHNIQUE: Multidetector CT imaging of the abdomen and pelvis was performed using the standard protocol following bolus administration of intravenous contrast. RADIATION DOSE REDUCTION: This exam was performed according to the departmental dose-optimization program which includes automated exposure control, adjustment of the mA and/or kV according to patient size and/or use of iterative reconstruction technique. CONTRAST:  78m OMNIPAQUE IOHEXOL 350 MG/ML SOLN COMPARISON:  None Available. FINDINGS: Patient's IV infiltrated during the contrast administration. As such, there is suboptimal contrast  opacification of the intra-abdominal structures. Lower chest: No acute abnormality. Hepatobiliary: No focal liver abnormality is seen. Status post cholecystectomy. Pancreas: Unremarkable. No pancreatic ductal dilatation or surrounding inflammatory changes. Spleen: Normal in size without focal abnormality. Adrenals/Urinary Tract: Adrenal glands are unremarkable. Solitary LEFT kidney is unremarkable without stone or hydronephrosis. No perinephric fluid. RIGHT kidney is absent. Stomach/Bowel: No dilated large or small bowel loops. Appendix is not convincingly seen but there are no inflammatory changes about the cecum to suggest acute appendicitis. Stomach is decompressed, limiting characterization. There is marked thickening of the walls of the descending and transverse portions of the duodenum, with associated inflammation/fluid stranding in the para duodenal fat. There is also at least mild thickening of the walls of the distal duodenum and proximal jejunum in the LEFT upper quadrant. Remainder of the small bowel appears normal, without additional site of bowel wall inflammation. Vascular/Lymphatic: Aortic atherosclerosis. No abdominal aortic aneurysm. No enlarged lymph nodes are seen in the abdomen or pelvis. Reproductive: Uterus and bilateral adnexa are unremarkable. Other: No abscess collection or free intraperitoneal air. Musculoskeletal: Osseous structures are unremarkable. IMPRESSION: 1. Marked thickening of the walls of the descending and transverse portions of the duodenum, with  associated inflammation/fluid stranding in the adjacent paraduodenal fat. Findings are consistent with an acute duodenitis, moderate to severe in degree based on the appearance, most likely infectious or inflammatory in nature. Duodenal ulcer could also cause this appearance. 2. Solitary LEFT kidney is unremarkable without stone or hydronephrosis. RIGHT kidney is absent. Patient's LEFT upper arm IV infiltrated during the contrast  administration. On physical exam of the LEFT upper extremity, there is no soft tissue tightness/firmness or skin redness identified. Patient denies significant pain. Patient's daughter was instructed to have her mother keep her arm elevated and to use either cool or warm compresses at the infiltration site if patient was discharged from the ER tonight. Patient's daughter was also instructed to monitor the LEFT arm for swelling, tightness, redness, blistering or pain and to return the patient to the emergency room if any of these findings were noted. Emergency room physician was also informed of the contrast extravasation at the LEFT upper arm IV site. In case patient will be admitted tonight, I entered the contrast extravasation order set via EPIC. Aortic Atherosclerosis (ICD10-I70.0). Electronically Signed   By: Franki Cabot M.D.   On: 08/05/2022 20:54      IMPRESSION AND PLAN:  Assessment and Plan: * Acute duodenitis - The patient will be admitted to a medical bed. - We will continue her on IV Protonix. - We will add p.o. Carafate. - GI consultation can be obtained in AM. - I sent a message to Dr. Candis Schatz.  Acute kidney injury superimposed on chronic kidney disease ([place]) - She will be hydrated with IV normal saline and will follow BMP. - We will hold off  metformin and lisinopril and avoid nephrotoxins.  Uncontrolled type 2 diabetes mellitus with hyperglycemia, with long-term current use of insulin ([place]) - She will be treated with IV normal saline. - She will be placed on supplemental resistant NovoLog coverage. - We will continue her basal coverage. - We will hold off her metformin.  Essential hypertension - She will be placed on as needed IV labetalol. - We will hold off lisinopril for now.  Dyslipidemia - We will continue statin therapy.  Glaucoma - We will continue her ophthalmic gtt.    DVT prophylaxis: SCDs.  Medical prophylaxis is held off given the risk of GI  bleeding. Advanced Care Planning:  Code Status: full code.  Family Communication:  The plan of care was discussed in details with the patient (and family). I answered all questions. The patient agreed to proceed with the above mentioned plan. Further management will depend upon hospital course. Disposition Plan: Back to previous home environment Consults called: Gastroenterology All the records are reviewed and case discussed with ED provider.  Status is: Inpatient    At the time of the admission, it appears that the appropriate admission status for this patient is inpatient.  This is judged to be reasonable and necessary in order to provide the required intensity of service to ensure the patient's safety given the presenting symptoms, physical exam findings and initial radiographic and laboratory data in the context of comorbid conditions.  The patient requires inpatient status due to high intensity of service, high risk of further deterioration and high frequency of surveillance required.  I certify that at the time of admission, it is my clinical judgment that the patient will require inpatient hospital care extending more than 2 midnights.  Dispo: The patient is from: Home              Anticipated d/c is to: Home              Patient currently is not medically stable to d/c.              Difficult to place patient: No  Christel Mormon M.D on 08/06/2022 at 2:27 AM  [facility]   From 7 PM-7 AM, contact night-coverage [email or web address]  CC: Primary care physician; Charlott Rakes, MD

## 2022-08-06 NOTE — Assessment & Plan Note (Signed)
-   She will be treated with IV normal saline. - She will be placed on supplemental resistant NovoLog coverage. - We will continue her basal coverage. - We will hold off her metformin.

## 2022-08-06 NOTE — Assessment & Plan Note (Addendum)
-   She will be hydrated with IV normal saline and will follow BMP. - We will hold off  metformin and lisinopril and avoid nephrotoxins.

## 2022-08-06 NOTE — Inpatient Diabetes Management (Signed)
Inpatient Diabetes Program Recommendations  AACE/ADA: New Consensus Statement on Inpatient Glycemic Control (2015)  Target Ranges:  Prepandial:   less than 140 mg/dL      Peak postprandial:   less than 180 mg/dL (1-2 hours)      Critically ill patients:  140 - 180 mg/dL   Lab Results  Component Value Date   GLUCAP 211 (H) 08/06/2022   HGBA1C 9.5 (H) 08/06/2022    Review of Glycemic Control  Latest Reference Range & Units 08/05/22 15:34 08/05/22 20:41 08/06/22 00:00 08/06/22 07:55 08/06/22 11:59  Glucose-Capillary 70 - 99 mg/dL 377 (H) 358 (H) 265 (H) 213 (H) 211 (H)   Diabetes history: DM 2 Outpatient Diabetes medications: Basaglar 34 units Daily, Metformin 1000 mg bid Current orders for Inpatient glycemic control:  Semglee 20 units Daily Novolog 0-20 units tid + hs  A1c 9.5% on 2/6  Inpatient Diabetes Program Recommendations:    Glucose trends >200 still. Not eating much at this time still.  -   Consider increasing Semglee to 25 units  Spoke with patient and daughter at bedside. Daughter translating. Pt lives with daughter. Daughter leaves out her mothers medication but says she does not know if pt takes them 100% of the time. She has good follow up with [facility]. Informed them of current A1c level of 9.5%. reviewed glucose and A1c goals. Encouraged follow up with PCP.   Thanks,  Tama Headings RN, MSN, BC-ADM Inpatient Diabetes Coordinator Team Pager [phone] (8a-5p)

## 2022-08-06 NOTE — Care Management (Addendum)
  Transition of Care (TOC) Screening Note   Patient Details  Name: Tina Phelps Date of Birth: 1946-01-04   Transition of Care [facility]) CM/SW Contact:    Carles Collet, RN Phone Number: 08/06/2022, 7:53 AM    Transition of Care Department [facility]) has reviewed patient and no TOC needs have been identified at this time. We will continue to monitor patient advancement through interdisciplinary progression rounds. If new patient transition needs arise, please place a TOC consult.   Low readmission risk. PCP [place] Medicare A&B, Medicaid Abd pain, pending GI consult. Tx IV protonix, oral carafate- tablet

## 2022-08-06 NOTE — Assessment & Plan Note (Signed)
-   She will be placed on as needed IV labetalol. - We will hold off lisinopril for now.

## 2022-08-06 NOTE — Progress Notes (Signed)
Initial Nutrition Assessment  DOCUMENTATION CODES:  Not applicable  INTERVENTION:  Boost Breeze po TID, each supplement provides 250 kcal and 9 grams of protein MVI with minerals daily Monitor for PO tolerance and adjust nutrition supplements as appropriate  NUTRITION DIAGNOSIS:  Inadequate oral intake related to nausea, vomiting as evidenced by per patient/family report.  GOAL:  Patient will meet greater than or equal to 90% of their needs  MONITOR:  PO intake, Supplement acceptance, Diet advancement, Labs, Weight trends  REASON FOR ASSESSMENT:  Malnutrition Screening Tool    ASSESSMENT:  Pt admitted with epigastric abdominal pain 2/2 acute duodenitis. PMH significant for T2DM, dyslipidemia, HTN, and osteoarthritis.  CT with moderate to severe degree based appearance most likely infectious versus inflammatory duodenal ulcer could also be this appearance. GI consulted. No recommendation for endoscopy inpatient unless pt does not improve with IV PPI's.   Pt's daughter present at bedside. Pt speaks Arabic but her daughter is able to provide history and translate as needed.   Pt's daughter states that she is a chronically light eater. She will usually have 1 large meal and a few small meals/snacks daily. She states that she will have tea with bread for one of those meals. She also really enjoys spicy foods. Her only dietary restriction is pork as they eat halal.   She has been unable to tolerate solids or liquids x4 days d/t emesis. She states that she continues with ongoing burning feeling in her chest which causes her to feel fearful of trying to take in any PO at this time.   Observed clear liquid tray on table at time of visit. She had not consumed any at that time but since it had cooled down by that time, encouraged her to trial a spoonful of broth to see if it would be better tolerated.   Pt's daughter states that her weight has remained stable to her knowledge.  Reviewed  documented weight history. Between 12/27/21-05/22/22 her weight was stable between 69-71 kg. Current admit weight noted to be 64 kg. Uncertain whether this is actual or stated weight. If weight change is accurate, this would be a weight loss of 10.7% which is clinically significant for time frame. Will continue to monitor weight trends throughout admission.   Medications: SSI 0-20 units TID, semglee, 34 units daily, protonix, carafate  Labs: sodium 131, BUN 37, Cr 1.20, GFR 48, HgbA1c 9.5%, CBG's 213-377 x24 hours  NUTRITION - FOCUSED PHYSICAL EXAM: Flowsheet Row Most Recent Value  Orbital Region No depletion  Upper Arm Region No depletion  Thoracic and Lumbar Region No depletion  Buccal Region No depletion  Temple Region No depletion  Clavicle Bone Region No depletion  Clavicle and Acromion Bone Region No depletion  Scapular Bone Region No depletion  Dorsal Hand No depletion  Patellar Region No depletion  Anterior Thigh Region No depletion  Posterior Calf Region No depletion  Edema (RD Assessment) None  Hair Reviewed  Eyes Reviewed  Mouth Reviewed  Skin Reviewed  Nails Reviewed       Diet Order:   Diet Order             Diet clear liquid Fluid consistency: Thin  Diet effective now                   EDUCATION NEEDS:  Education needs have been addressed  Skin:  Skin Assessment: Reviewed RN Assessment  Last BM:  2/4  Height:  Ht Readings from Last 1 Encounters:  08/05/22 '5\' 3"'$  (1.6 m)    Weight:  Wt Readings from Last 1 Encounters:  08/05/22 64 kg   BMI:  Body mass index is 24.98 kg/m.  Estimated Nutritional Needs:  Kcal:  1600-1800  Protein:  80-95g  Fluid:  >/=1.6L  Clayborne Dana, RDN, LDN Clinical Nutrition

## 2022-08-06 NOTE — Assessment & Plan Note (Signed)
-   The patient will be admitted to a medical bed. - We will continue her on IV Protonix. - We will add p.o. Carafate. - GI consultation can be obtained in AM. - I sent a message to Dr. Candis Schatz.

## 2022-08-06 NOTE — Assessment & Plan Note (Signed)
-   We will continue her ophthalmic gtt.

## 2022-08-06 NOTE — Assessment & Plan Note (Signed)
-   We will continue statin therapy. 

## 2022-08-07 DIAGNOSIS — K2980 Duodenitis without bleeding: Secondary | ICD-10-CM | POA: Diagnosis not present

## 2022-08-07 LAB — CBC WITH DIFFERENTIAL/PLATELET
Abs Immature Granulocytes: 0.17 10*3/uL — ABNORMAL HIGH (ref 0.00–0.07)
Basophils Absolute: 0.1 10*3/uL (ref 0.0–0.1)
Basophils Relative: 0 %
Eosinophils Absolute: 0 10*3/uL (ref 0.0–0.5)
Eosinophils Relative: 0 %
HCT: 36.1 % (ref 36.0–46.0)
Hemoglobin: 11.9 g/dL — ABNORMAL LOW (ref 12.0–15.0)
Immature Granulocytes: 1 %
Lymphocytes Relative: 19 %
Lymphs Abs: 2.8 10*3/uL (ref 0.7–4.0)
MCH: 28.6 pg (ref 26.0–34.0)
MCHC: 33 g/dL (ref 30.0–36.0)
MCV: 86.8 fL (ref 80.0–100.0)
Monocytes Absolute: 1.2 10*3/uL — ABNORMAL HIGH (ref 0.1–1.0)
Monocytes Relative: 8 %
Neutro Abs: 10.6 10*3/uL — ABNORMAL HIGH (ref 1.7–7.7)
Neutrophils Relative %: 72 %
Platelets: 231 10*3/uL (ref 150–400)
RBC: 4.16 MIL/uL (ref 3.87–5.11)
RDW: 12.2 % (ref 11.5–15.5)
WBC: 14.8 10*3/uL — ABNORMAL HIGH (ref 4.0–10.5)
nRBC: 0 % (ref 0.0–0.2)

## 2022-08-07 LAB — IRON AND TIBC
Iron: 43 ug/dL (ref 28–170)
Saturation Ratios: 16 % (ref 10.4–31.8)
TIBC: 267 ug/dL (ref 250–450)
UIBC: 224 ug/dL

## 2022-08-07 LAB — BASIC METABOLIC PANEL
Anion gap: 10 (ref 5–15)
BUN: 24 mg/dL — ABNORMAL HIGH (ref 8–23)
CO2: 24 mmol/L (ref 22–32)
Calcium: 8.3 mg/dL — ABNORMAL LOW (ref 8.9–10.3)
Chloride: 101 mmol/L (ref 98–111)
Creatinine, Ser: 1.05 mg/dL — ABNORMAL HIGH (ref 0.44–1.00)
GFR, Estimated: 56 mL/min — ABNORMAL LOW (ref >60)
Glucose, Bld: 285 mg/dL — ABNORMAL HIGH (ref 70–99)
Potassium: 3.2 mmol/L — ABNORMAL LOW (ref 3.5–5.1)
Sodium: 135 mmol/L (ref 135–145)

## 2022-08-07 LAB — GLUCOSE, CAPILLARY
Glucose-Capillary: 188 mg/dL — ABNORMAL HIGH (ref 70–99)
Glucose-Capillary: 107 mg/dL — ABNORMAL HIGH (ref 70–99)
Glucose-Capillary: 160 mg/dL — ABNORMAL HIGH (ref 70–99)
Glucose-Capillary: 103 mg/dL — ABNORMAL HIGH (ref 70–99)

## 2022-08-07 LAB — FERRITIN: Ferritin: 75 ng/mL (ref 11–307)

## 2022-08-07 MED ORDER — HYDRALAZINE HCL 20 MG/ML IJ SOLN: 10.0000 mg | INTRAMUSCULAR | Status: DC | PRN

## 2022-08-07 MED ORDER — POTASSIUM CHLORIDE CRYS ER 20 MEQ PO TBCR
40.0000 meq | EXTENDED_RELEASE_TABLET | ORAL | Status: AC
  Administered 2022-08-07 (×2): 40 meq via ORAL
  Filled 2022-08-07 (×2): qty 2

## 2022-08-07 MED ORDER — ARTIFICIAL TEARS OPHTHALMIC OINT
1.0000 | TOPICAL_OINTMENT | Freq: Every day | OPHTHALMIC | Status: DC
  Administered 2022-08-07: 1 via OPHTHALMIC
  Filled 2022-08-07: qty 3.5

## 2022-08-07 NOTE — Plan of Care (Signed)

## 2022-08-07 NOTE — Hospital Course (Signed)
77 y.o. [name] with medical history significant for type 2 diabetes mellitus, dyslipidemia, hypertension and osteoarthritis, who presented to the emergency room with acute onset of epigastric abdominal pain for the last 4 days which has been significantly worse with any type of food as well as recurrent vomiting.  She had associated diarrhea that lasted a couple of days.  She denies any melena or bright red bleeding per rectum.  She saw streaks of blood occasionally with vomitus.  No current bloody vomitus or hematemesis.  She denies any dysuria oliguria or hematuria or flank pain.  No chest pain or palpitations.  No cough or wheezing hemoptysis.  No other bleeding diathesis.

## 2022-08-07 NOTE — Plan of Care (Signed)
  Problem: Education: Goal: Knowledge of General Education information will improve Description: Including pain rating scale, medication(s)/side effects and non-pharmacologic comfort measures Outcome: Progressing   Problem: Clinical Measurements: Goal: Will remain free from infection Outcome: Progressing   Problem: Activity: Goal: Risk for activity intolerance will decrease Outcome: Progressing   Problem: Nutrition: Goal: Adequate nutrition will be maintained Outcome: Progressing   Problem: Pain Managment: Goal: General experience of comfort will improve Outcome: Progressing   Problem: Safety: Goal: Ability to remain free from injury will improve Outcome: Progressing   Problem: Skin Integrity: Goal: Risk for impaired skin integrity will decrease Outcome: Progressing   Problem: Education: Goal: Ability to describe self-care measures that may prevent or decrease complications (Diabetes Survival Skills Education) will improve Outcome: Progressing   Problem: Metabolic: Goal: Ability to maintain appropriate glucose levels will improve Outcome: Progressing

## 2022-08-07 NOTE — Progress Notes (Signed)
  Progress Note   Patient: Tina Phelps EXN:[id number] DOB: June 06, 1946 DOA: 08/05/2022     2 DOS: the patient was seen and examined on 08/07/2022   Brief hospital course: 77 y.o. [name] with medical history significant for type 2 diabetes mellitus, dyslipidemia, hypertension and osteoarthritis, who presented to the emergency room with acute onset of epigastric abdominal pain for the last 4 days which has been significantly worse with any type of food as well as recurrent vomiting.  She had associated diarrhea that lasted a couple of days.  She denies any melena or bright red bleeding per rectum.  She saw streaks of blood occasionally with vomitus.  No current bloody vomitus or hematemesis.  She denies any dysuria oliguria or hematuria or flank pain.  No chest pain or palpitations.  No cough or wheezing hemoptysis.  No other bleeding diathesis.  Assessment and Plan: Acute duodenitis/presumed duodenal ulcer:  -Feels better but still with epigastric tenderness.   -GI consulted.  Per GI, no role for endoscopy at this time with plan to continue to evaluate -Continued on Protonix and Carafate   AKI on kidney stage IIIa:  -Baseline creatinine appears to be around 1.0 -Cr presented with 1.33 -Cr down to 1.05 this AM   Uncontrolled type 2 diabetes mellitus with hyperglycemia:  -Hemoglobin A1c 9.5.  She takes 34 units of Lantus at home.   -Currently on 20 units lantus, continue SSI.   Essential hypertension:  -Blood pressure stable, albeit suboptimally controlled -ACEI and diuretic on hold secondary to ARF - Will cont on PRN hydralazine   Dyslipidemia:  -Continue atorvastatin.   Acute hyponatremia:  -normalized  Hypokalemia -Replace      Subjective: Reports feeling better today  Physical Exam: Vitals:   08/06/22 1749 08/06/22 2040 08/07/22 0342 08/07/22 0741  BP: (!) 151/52 (!) 151/60 (!) 159/54 (!) 163/75  Pulse: 68 71 71 87  Resp: '16 16 16 18  '$ Temp: 97.7 F (36.5 C) 98.4 F  (36.9 C) 99.6 F (37.6 C) 98 F (36.7 C)  TempSrc: Oral Oral Oral Oral  SpO2: 98% 100% 100% 100%  Weight:      Height:       General exam: Awake, laying in bed, in nad Respiratory system: Normal respiratory effort, no wheezing Cardiovascular system: regular rate, s1, s2 Gastrointestinal system: Soft, nondistended, positive BS Central nervous system: CN2-12 grossly intact, strength intact Extremities: Perfused, no clubbing Skin: Normal skin turgor, no notable skin lesions seen Psychiatry: Mood normal // no visual hallucinations   Data Reviewed:  Labs reviewed: Na 135, K 3.2, Cr 1.05, WBC 14.8  Family Communication: Pt in room, family not at bedside  Disposition: Status is: Inpatient Remains inpatient appropriate because: Severity of illness  Planned Discharge Destination: Home    Author: Marylu Lund, MD 08/07/2022 2:31 PM  For on call review [email or web address].

## 2022-08-07 NOTE — Progress Notes (Addendum)
Attending physician's note   I have taken a history, reviewed the chart, and examined the patient. I performed a substantive portion of this encounter, including complete performance of at least one of the key components, in conjunction with the APP. I agree with the APP's note, impression, and recommendations with my edits.   Tina Berland, DO, FACG [phone] office          Progress Note  Primary GI: Unassigned    Subjective  Chief Complaint:Epigastric abdominal pain   Patient with family at bedside, helped translate. Provided some of the history.  Improving WBC, tolerating liquid diet well. Decreased abdominal pain, no nausea or vomiting. Patient still is not had a bowel movement but she is passing gas. Denies fever or chills.    Objective   Vital signs in last 24 hours: Temp:  [97.7 F (36.5 C)-99.6 F (37.6 C)] 98 F (36.7 C) (02/07 0741) Pulse Rate:  [68-87] 87 (02/07 0741) Resp:  [16-18] 18 (02/07 0741) BP: (151-163)/(52-75) 163/75 (02/07 0741) SpO2:  [98 %-100 %] 100 % (02/07 0741) Last BM Date : 08/05/22 Last BM recorded by nurses in past 5 days No data recorded  General:   Pleasant, well developed female in no acute distress Heart:  regular rate and rhythm Pulm: Clear anteriorly; no wheezing Abdomen:  Soft, Flat AB, Active bowel sounds. mild tenderness in the epigastrium. Without guarding and Without rebound. Extremities:  Without edema. Msk:  Symmetrical without gross deformities. Peripheral pulses intact.  Neurologic:  Alert and  oriented x4;  No focal deficits.  Skin:   Dry and intact without significant lesions or rashes. Psychiatric:  Cooperative. Normal mood and affect.  Intake/Output from previous day: 02/06 0701 - 02/07 0700 In: 2239.2 [P.O.:180; I.V.:2059.2] Out: -  Intake/Output this shift: No intake/output data recorded.  Studies/Results: CT Abdomen Pelvis W Contrast  Result Date: 08/05/2022 CLINICAL DATA:  Acute abdominal  pain. EXAM: CT ABDOMEN AND PELVIS WITH CONTRAST TECHNIQUE: Multidetector CT imaging of the abdomen and pelvis was performed using the standard protocol following bolus administration of intravenous contrast. RADIATION DOSE REDUCTION: This exam was performed according to the departmental dose-optimization program which includes automated exposure control, adjustment of the mA and/or kV according to patient size and/or use of iterative reconstruction technique. CONTRAST:  35m OMNIPAQUE IOHEXOL 350 MG/ML SOLN COMPARISON:  None Available. FINDINGS: Patient's IV infiltrated during the contrast administration. As such, there is suboptimal contrast opacification of the intra-abdominal structures. Lower chest: No acute abnormality. Hepatobiliary: No focal liver abnormality is seen. Status post cholecystectomy. Pancreas: Unremarkable. No pancreatic ductal dilatation or surrounding inflammatory changes. Spleen: Normal in size without focal abnormality. Adrenals/Urinary Tract: Adrenal glands are unremarkable. Solitary LEFT kidney is unremarkable without stone or hydronephrosis. No perinephric fluid. RIGHT kidney is absent. Stomach/Bowel: No dilated large or small bowel loops. Appendix is not convincingly seen but there are no inflammatory changes about the cecum to suggest acute appendicitis. Stomach is decompressed, limiting characterization. There is marked thickening of the walls of the descending and transverse portions of the duodenum, with associated inflammation/fluid stranding in the para duodenal fat. There is also at least mild thickening of the walls of the distal duodenum and proximal jejunum in the LEFT upper quadrant. Remainder of the small bowel appears normal, without additional site of bowel wall inflammation. Vascular/Lymphatic: Aortic atherosclerosis. No abdominal aortic aneurysm. No enlarged lymph nodes are seen in the abdomen or pelvis. Reproductive: Uterus and bilateral adnexa are unremarkable. Other: No  abscess  collection or free intraperitoneal air. Musculoskeletal: Osseous structures are unremarkable. IMPRESSION: 1. Marked thickening of the walls of the descending and transverse portions of the duodenum, with associated inflammation/fluid stranding in the adjacent paraduodenal fat. Findings are consistent with an acute duodenitis, moderate to severe in degree based on the appearance, most likely infectious or inflammatory in nature. Duodenal ulcer could also cause this appearance. 2. Solitary LEFT kidney is unremarkable without stone or hydronephrosis. RIGHT kidney is absent. Patient's LEFT upper arm IV infiltrated during the contrast administration. On physical exam of the LEFT upper extremity, there is no soft tissue tightness/firmness or skin redness identified. Patient denies significant pain. Patient's daughter was instructed to have her mother keep her arm elevated and to use either cool or warm compresses at the infiltration site if patient was discharged from the ER tonight. Patient's daughter was also instructed to monitor the LEFT arm for swelling, tightness, redness, blistering or pain and to return the patient to the emergency room if any of these findings were noted. Emergency room physician was also informed of the contrast extravasation at the LEFT upper arm IV site. In case patient will be admitted tonight, I entered the contrast extravasation order set via EPIC. Aortic Atherosclerosis (ICD10-I70.0). Electronically Signed   By: Franki Cabot M.D.   On: 08/05/2022 20:54    Lab Results: Recent Labs    08/05/22 1725 08/06/22 0308 08/07/22 0329  WBC 21.4* 21.5* 14.8*  HGB 14.1 13.2 11.9*  HCT 41.8 37.5 36.1  PLT 282 269 231   BMET Recent Labs    08/05/22 1725 08/06/22 0308 08/07/22 0329  NA 131* 131* 135  K 3.9 3.5 3.2*  CL 94* 100 101  CO2 '22 22 24  '$ GLUCOSE 407* 227* 285*  BUN 43* 37* 24*  CREATININE 1.33* 1.20* 1.05*  CALCIUM 8.7* 8.5* 8.3*   LFT Recent Labs     08/05/22 1725  PROT 7.0  ALBUMIN 3.5  AST 19  ALT 18  ALKPHOS 99  BILITOT 1.1   PT/INR No results for input(s): "LABPROT", "INR" in the last 72 hours.   Scheduled Meds:  atorvastatin  80 mg Oral Daily   brimonidine  1 drop Both Eyes BID   dorzolamide-timolol  1 drop Both Eyes BID   feeding supplement  1 Container Oral TID BM   gabapentin  300 mg Oral QHS   insulin aspart  0-20 Units Subcutaneous TID AC & HS   insulin glargine-yfgn  20 Units Subcutaneous Daily   latanoprost  1 drop Both Eyes QHS   multivitamin with minerals  1 tablet Oral Daily   pantoprazole (PROTONIX) IV  40 mg Intravenous Q12H   potassium chloride  40 mEq Oral Q4H   sodium chloride   Both Eyes QHS   sucralfate  1 g Oral TID WC & HS   Continuous Infusions:  sodium chloride 100 mL/hr at 08/07/22 0120      Patient profile:   77 year old female with history of diabetes (A1c 9.5%), HTN, HLD, OA, GERD, admitted with epigastric pain and nausea/vomiting x 4 days    Impression/Plan:   Acute duodenitis Associated leukocytosis which is improving, no evidence of GI bleeding. CT with moderate to severe degree based appearance most likely infectious versus inflammatory duodenal ulcer could also be this appearance. Daughter previously diagnosed with H. pylori and treated. No role for endoscopy at this time, continue to evaluate Continue high dose PPI BID --Tolerating liquid diet, will advance to full liquid, advance as tolerated.  Anemia HGB 11.9, MCV 36.1 No acute GI bleed Possible dilutional, wil get Iron/Ferritin  Uncontrolled type 2 diabetes mellitus  with hyperglycemia, with long-term current use of insulin ([place]) Per primary   Acute kidney injury superimposed on chronic kidney disease ([place]) Per primary.  Principal Problem:   Acute duodenitis Active Problems:   Glaucoma   Uncontrolled type 2 diabetes mellitus with hyperglycemia, with long-term current use of insulin ([facility])   Dyslipidemia   Acute  kidney injury superimposed on chronic kidney disease ([place])   Essential hypertension   Abdominal pain, epigastric   Nausea and vomiting    LOS: 2 days   Tina Phelps  08/07/2022, 11:27 AM

## 2022-08-07 NOTE — Inpatient Diabetes Management (Signed)
Inpatient Diabetes Program Recommendations  AACE/ADA: New Consensus Statement on Inpatient Glycemic Control (2015)  Target Ranges:  Prepandial:   less than 140 mg/dL      Peak postprandial:   less than 180 mg/dL (1-2 hours)      Critically ill patients:  140 - 180 mg/dL   Lab Results  Component Value Date   GLUCAP 188 (H) 08/07/2022   HGBA1C 9.5 (H) 08/06/2022    Review of Glycemic Control  Latest Reference Range & Units 08/06/22 07:55 08/06/22 11:59 08/06/22 17:46 08/06/22 22:22 08/07/22 07:49  Glucose-Capillary 70 - 99 mg/dL 213 (H) 211 (H) 149 (H) 241 (H) 188 (H)  (H): Data is abnormally high  Diabetes history: DM 2 Outpatient Diabetes medications: Basaglar 34 units Daily, Metformin 1000 mg bid Current orders for Inpatient glycemic control:  Semglee 20 units Daily Novolog 0-20 units tid + hs  Inpatient Diabetes Program Recommendations:    Please consider:  Semglee 24 units QD  Will continue to follow while inpatient.  Thank you, Reche Dixon, MSN, [place] Diabetes Coordinator Inpatient Diabetes Program [phone] (team pager from 8a-5p)

## 2022-08-08 ENCOUNTER — Telehealth: Payer: Self-pay

## 2022-08-08 ENCOUNTER — Other Ambulatory Visit: Payer: Self-pay

## 2022-08-08 ENCOUNTER — Other Ambulatory Visit (HOSPITAL_COMMUNITY): Payer: Self-pay

## 2022-08-08 DIAGNOSIS — K219 Gastro-esophageal reflux disease without esophagitis: Secondary | ICD-10-CM

## 2022-08-08 DIAGNOSIS — K2980 Duodenitis without bleeding: Secondary | ICD-10-CM | POA: Diagnosis not present

## 2022-08-08 LAB — CBC
HCT: 33.5 % — ABNORMAL LOW (ref 36.0–46.0)
Hemoglobin: 11 g/dL — ABNORMAL LOW (ref 12.0–15.0)
MCH: 28.7 pg (ref 26.0–34.0)
MCHC: 32.8 g/dL (ref 30.0–36.0)
MCV: 87.5 fL (ref 80.0–100.0)
Platelets: 226 10*3/uL (ref 150–400)
RBC: 3.83 MIL/uL — ABNORMAL LOW (ref 3.87–5.11)
RDW: 12.4 % (ref 11.5–15.5)
WBC: 10.3 10*3/uL (ref 4.0–10.5)
nRBC: 0 % (ref 0.0–0.2)

## 2022-08-08 LAB — COMPREHENSIVE METABOLIC PANEL
ALT: 12 U/L (ref 0–44)
AST: 18 U/L (ref 15–41)
Albumin: 2.5 g/dL — ABNORMAL LOW (ref 3.5–5.0)
Alkaline Phosphatase: 69 U/L (ref 38–126)
Anion gap: 6 (ref 5–15)
BUN: 10 mg/dL (ref 8–23)
CO2: 28 mmol/L (ref 22–32)
Calcium: 8.4 mg/dL — ABNORMAL LOW (ref 8.9–10.3)
Chloride: 103 mmol/L (ref 98–111)
Creatinine, Ser: 0.92 mg/dL (ref 0.44–1.00)
GFR, Estimated: >60 mL/min (ref >60)
Glucose, Bld: 136 mg/dL — ABNORMAL HIGH (ref 70–99)
Potassium: 3.5 mmol/L (ref 3.5–5.1)
Sodium: 137 mmol/L (ref 135–145)
Total Bilirubin: 0.7 mg/dL (ref 0.3–1.2)
Total Protein: 5.2 g/dL — ABNORMAL LOW (ref 6.5–8.1)

## 2022-08-08 LAB — GLUCOSE, CAPILLARY
Glucose-Capillary: 118 mg/dL — ABNORMAL HIGH (ref 70–99)
Glucose-Capillary: 130 mg/dL — ABNORMAL HIGH (ref 70–99)

## 2022-08-08 MED ORDER — PANTOPRAZOLE SODIUM 40 MG PO TBEC
40.0000 mg | DELAYED_RELEASE_TABLET | Freq: Every day | ORAL | 0 refills | Status: DC
  Filled 2022-08-08 – 2022-11-07 (×2): qty 90, 90d supply, fill #0

## 2022-08-08 MED ORDER — SUCRALFATE 1 G PO TABS
1.0000 g | ORAL_TABLET | Freq: Four times a day (QID) | ORAL | 0 refills | Status: DC
  Filled 2022-08-08: qty 112, 28d supply, fill #0

## 2022-08-08 MED ORDER — BASAGLAR KWIKPEN 100 UNIT/ML ~~LOC~~ SOPN: 25.0000 [IU] | PEN_INJECTOR | Freq: Every day | SUBCUTANEOUS | 6 refills | Status: DC

## 2022-08-08 MED ORDER — PANTOPRAZOLE SODIUM 40 MG PO TBEC
40.0000 mg | DELAYED_RELEASE_TABLET | Freq: Two times a day (BID) | ORAL | Status: DC
  Administered 2022-08-08: 40 mg via ORAL
  Filled 2022-08-08: qty 1

## 2022-08-08 MED ORDER — LISINOPRIL 20 MG PO TABS
20.0000 mg | ORAL_TABLET | Freq: Every day | ORAL | Status: DC
  Administered 2022-08-08: 20 mg via ORAL
  Filled 2022-08-08: qty 1

## 2022-08-08 MED ORDER — PANTOPRAZOLE SODIUM 40 MG PO TBEC: 40.0000 mg | DELAYED_RELEASE_TABLET | Freq: Every day | ORAL | Status: DC

## 2022-08-08 NOTE — Telephone Encounter (Signed)
-----   Message from Vladimir Crofts, [place] sent at 08/08/2022 12:54 PM EST ----- Regarding: Please set up a hospital follow up! Thanks! Patient of Dr. Bryan Lemma, hospital follow up for duodenitis.  Needs 3-4 weeks OV in the office with Dr. Bryan Lemma or an APP.  Thanks!

## 2022-08-08 NOTE — Progress Notes (Signed)
IV site to right [facility] D/C'd with catheter intact. Site w/o redness or swelling. Pt tolerated well.

## 2022-08-08 NOTE — Progress Notes (Addendum)
Attending physician's note   I have taken a history, reviewed the chart, and examined the patient. I performed a substantive portion of this encounter, including complete performance of at least one of the key components, in conjunction with the APP. I agree with the APP's note, impression, and recommendations with my edits.  Continues to improve clinically and tolerating increasing amounts of p.o. intake.  Nausea resolved.  Discussed with patient, family member at bedside, and daughter by phone.  Patient is eager for discharge to home.  Do not feel that she needs inpatient endoscopy.  If continued symptoms as an outpatient, could consider outpatient EGD.  Otherwise, ok for DC home with plan for medication management as outlined. - Discussed slowly advancing diet as an outpatient. - Repeat CBC check at [facility] office 7-10 days after hospital discharge to ensure returning towards normal/baseline. - Follow-up in the GI clinic prn  [place], DO, FACG [phone] office          Progress Note  Primary GI: Unassigned    Subjective  Chief Complaint:Epigastric abdominal pain   No family at bedside to translate.  Leukocytosis resolved, tolerating full liquid diet well but she is afraid to eat. New anemia, normal iron/ferritin. Decreased abdominal pain, no nausea or vomiting. Patient still is not had a bowel movement but she is passing gas. Denies fever or chills.    Objective   Vital signs in last 24 hours: Temp:  [97.5 F (36.4 C)-98.3 F (36.8 C)] 98.2 F (36.8 C) (02/08 0726) Pulse Rate:  [68-76] 68 (02/08 0726) Resp:  [18] 18 (02/08 0354) BP: (155-167)/(55-73) 167/73 (02/08 0726) SpO2:  [100 %] 100 % (02/08 0726) Last BM Date : 08/05/22 Last BM recorded by nurses in past 5 days No data recorded  General:   Pleasant, well developed female in no acute distress Heart:  regular rate and rhythm Pulm: Clear anteriorly; no wheezing Abdomen:  Soft, Flat AB, Active  bowel sounds. mild tenderness in the epigastrium but improved. Without guarding and Without rebound. Extremities:  Without edema. Msk:  Symmetrical without gross deformities. Peripheral pulses intact.  Neurologic:  Alert and  oriented x4;  No focal deficits.  Skin:   Dry and intact without significant lesions or rashes. Psychiatric:  Cooperative. Normal mood and affect.  Intake/Output from previous day: 02/07 0701 - 02/08 0700 In: 2873.2 [P.O.:240; I.V.:2633.2] Out: -  Intake/Output this shift: No intake/output data recorded.  Studies/Results: No results found.  Lab Results: Recent Labs    08/06/22 0308 08/07/22 0329 08/08/22 0331  WBC 21.5* 14.8* 10.3  HGB 13.2 11.9* 11.0*  HCT 37.5 36.1 33.5*  PLT 269 231 226   BMET Recent Labs    08/06/22 0308 08/07/22 0329 08/08/22 0331  NA 131* 135 137  K 3.5 3.2* 3.5  CL 100 101 103  CO2 '22 24 28  '$ GLUCOSE 227* 285* 136*  BUN 37* 24* 10  CREATININE 1.20* 1.05* 0.92  CALCIUM 8.5* 8.3* 8.4*   LFT Recent Labs    08/08/22 0331  PROT 5.2*  ALBUMIN 2.5*  AST 18  ALT 12  ALKPHOS 69  BILITOT 0.7   PT/INR No results for input(s): "LABPROT", "INR" in the last 72 hours.   Scheduled Meds:  artificial tears  1 Application Both Eyes QHS   atorvastatin  80 mg Oral Daily   brimonidine  1 drop Both Eyes BID   dorzolamide-timolol  1 drop Both Eyes BID   feeding supplement  1  Container Oral TID BM   gabapentin  300 mg Oral QHS   insulin aspart  0-20 Units Subcutaneous TID AC & HS   insulin glargine-yfgn  20 Units Subcutaneous Daily   latanoprost  1 drop Both Eyes QHS   multivitamin with minerals  1 tablet Oral Daily   pantoprazole (PROTONIX) IV  40 mg Intravenous Q12H   sucralfate  1 g Oral TID WC & HS   Continuous Infusions:  sodium chloride 100 mL/hr at 08/08/22 0545      Patient profile:   77 year old female with history of diabetes (A1c 9.5%), HTN, HLD, OA, GERD, admitted with epigastric pain and nausea/vomiting x  4 days    Impression/Plan:   Acute duodenitis Associated leukocytosis which is resolved, no evidence of GI bleeding. CT with moderate to severe degree based appearance most likely infectious versus inflammatory duodenal ulcer could also be this appearance. Daughter previously diagnosed with H. pylori and treated. Continue high dose PPI BID with carafate Tolerating full liquid diet, advance as tolerated. Patient is making some improvement with tolerating liquid diet, still has abdominal discomfort and and some fear of eating.  If she can continue to advance diet, can discharge home on protonix 40 mg BID for at least 3 months, carafate QID for 4 weeks, and close follow up in our office.  If she is unable to advance her diet will discuss about EGD during admission.   Anemia No acute GI bleed HGB 11.0 MCV 87.5 Platelets 226 Iron 43 Ferritin 75 normal range, consider folate/B12  Uncontrolled type 2 diabetes mellitus  with hyperglycemia, with long-term current use of insulin ([facility]) Per primary   Acute kidney injury superimposed on chronic kidney disease ([place]) Per primary.  Principal Problem:   Acute duodenitis Active Problems:   Glaucoma   Uncontrolled type 2 diabetes mellitus with hyperglycemia, with long-term current use of insulin ([facility])   Dyslipidemia   Acute kidney injury superimposed on chronic kidney disease ([place])   Essential hypertension   Abdominal pain, epigastric   Nausea and vomiting    LOS: 3 days   Tina Phelps  08/08/2022, 9:18 AM

## 2022-08-08 NOTE — Telephone Encounter (Signed)
Spoke with pt's daughter and scheduled pt for a hospital follow up with Vicie Mutters on 09/12/22. Pt and pt's daughter were unable to do an appointment on 3/8 because pt needed a Thursday appointment.

## 2022-08-08 NOTE — Discharge Summary (Addendum)
Physician Discharge Summary   Patient: Tina Phelps MRN: [id number] DOB: 1945-07-04  Admit date:     08/05/2022  Discharge date: 08/08/22  Discharge Physician: Marylu Lund   PCP: Charlott Rakes, MD   Recommendations at discharge:    Follow up with PCP in 1-2 weeks Follow up with GI as scheduled  Please titrate insulin with goal of euglycemia. Home lantus was decreased to 25 units on d/c given limited PO intake  Discharge Diagnoses: Principal Problem:   Acute duodenitis Active Problems:   Acute kidney injury superimposed on chronic kidney disease ([place])   Uncontrolled type 2 diabetes mellitus with hyperglycemia, with long-term current use of insulin (HCC)   Glaucoma   Dyslipidemia   Essential hypertension   Abdominal pain, epigastric   Nausea and vomiting  Resolved Problems:   * No resolved hospital problems. [facility] Course: 77 y.o. [name] with medical history significant for type 2 diabetes mellitus, dyslipidemia, hypertension and osteoarthritis, who presented to the emergency room with acute onset of epigastric abdominal pain for the last 4 days which has been significantly worse with any type of food as well as recurrent vomiting.  She had associated diarrhea that lasted a couple of days.  She denies any melena or bright red bleeding per rectum.  She saw streaks of blood occasionally with vomitus.  No current bloody vomitus or hematemesis.  She denies any dysuria oliguria or hematuria or flank pain.  No chest pain or palpitations.  No cough or wheezing hemoptysis.  No other bleeding diathesis.   Assessment and Plan: Acute duodenitis/presumed duodenal ulcer:  -GI consulted.  -Symptoms improved and pt was able to tolerate PO - Per GI, no role for endoscopy at this time. GI recs for 3 months PPI BID and 4 weeks of carafate QID -Pt to follow up with GI as scheduled   AKI on kidney stage IIIa:  -Baseline creatinine appears to be around 1.0 -Cr normalized with IVF    Uncontrolled type 2 diabetes mellitus with hyperglycemia:  -Hemoglobin A1c 9.5.  She takes 34 units of Lantus at home.   -Had been on 20 units lantus, continue SSI while with limited PO intake -Given limited PO, have decreased home lantus to 25 units. Would have pt f/u with PCP to further titrate insulin   Essential hypertension:  -Blood pressure stable, albeit suboptimally controlled -ACEI and diuretic initially on hold secondary to ARF -resume home meds   Dyslipidemia:  -Continue atorvastatin.   Acute hyponatremia:  -normalized   Hypokalemia -Replaced       Consultants: GI Procedures performed:   Disposition: Home Diet recommendation:  Carb modified diet DISCHARGE MEDICATION: Allergies as of 08/08/2022       Reactions   Pork-derived Products    Religious preference- non halal        Medication List     STOP taking these medications    terbinafine 250 MG tablet Commonly known as: LAMISIL   timolol 0.5 % ophthalmic solution Commonly known as: BETIMOL       TAKE these medications    Accu-Chek FastClix Lancets Misc Use as directed   Accu-Chek Softclix Lancets lancets Use as instructed tid before meals   Accu-Chek Guide test strip Generic drug: glucose blood Use as instructed tid   Accu-Chek Guide w/Device Kit Use as directed tid   atorvastatin 80 MG tablet Commonly known as: LIPITOR Take 1 tablet (80 mg total) by mouth daily.   B-D UF III MINI PEN NEEDLES 31G  X 5 MM Misc Generic drug: Insulin Pen Needle USE AS DIRECTED   Basaglar KwikPen 100 UNIT/ML Inject 25 Units into the skin daily. What changed: how much to take   brimonidine 0.2 % ophthalmic solution Commonly known as: ALPHAGAN Instill 1 drop into both eyes twice a day (Will send in additional refills after patient's yearly exam)   chlorthalidone 25 MG tablet Commonly known as: HYGROTON Take 1 tablet (25 mg total) by mouth daily.   ciclopirox 8 % solution Commonly known as:  Penlac Apply topically at bedtime. Apply over nail and surrounding skin. Apply daily over previous coat. After seven (7) days, may remove with alcohol and continue cycle.   Dexcom G6 Receiver Devi Use to check blood sugar three times daily.   Dexcom G6 Sensor Misc Use to check blood sugar three times daily. Change sensor once every 10 days.   Dexcom G6 Transmitter Misc Use to check blood sugar three times daily. Change transmitter once every 90 days.   diclofenac sodium 1 % Gel Commonly known as: VOLTAREN Apply 2 g topically 4 (four) times daily. What changed:  when to take this reasons to take this   dorzolamide-timolol 2-0.5 % ophthalmic solution Commonly known as: COSOPT Instill 1 drop into both eyes twice a day (Will send in additional refills after patient's yearly exam) What changed: Another medication with the same name was removed. Continue taking this medication, and follow the directions you see here.   gabapentin 300 MG capsule Commonly known as: NEURONTIN TAKE 1 CAPSULE (300 MG TOTAL) BY MOUTH AT BEDTIME. What changed: how much to take   latanoprost 0.005 % ophthalmic solution Commonly known as: XALATAN Instill 1 drop into both eyes every evening   lisinopril 20 MG tablet Commonly known as: ZESTRIL TAKE 1 TABLET (20 MG TOTAL) BY MOUTH DAILY. What changed: how much to take   metFORMIN 500 MG 24 hr tablet Commonly known as: GLUCOPHAGE-XR TAKE 2 TABLETS (1,000 MG TOTAL) BY MOUTH DAILY WITH BREAKFAST.   Muro 128 5 % ophthalmic ointment Generic drug: sodium chloride Place 1 Application into both eyes every evening.   ondansetron 8 MG disintegrating tablet Commonly known as: ZOFRAN-ODT Place 1 tablet twice a day by translingual route as needed for 3 days.   pantoprazole 40 MG tablet Commonly known as: PROTONIX Take 1 tablet (40 mg total) by mouth daily.   sucralfate 1 g tablet Commonly known as: CARAFATE Take 1 tablet (1 g total) by mouth 4 (four) times  daily for 28 days.        Follow-up Information     Charlott Rakes, MD Follow up in 2 week(s).   Specialty: Family Medicine Why: Hospital follow up Contact information: [place] [address] [phone]         Vladimir Crofts, PA-C Follow up on 09/12/2022.   Specialty: Gastroenterology Why: Hospital follow up, as scheduled Contact information: [address] [address] [phone]                Discharge Exam: Filed Weights   08/05/22 1531  Weight: 64 kg   General exam: Awake, laying in bed, in nad Respiratory system: Normal respiratory effort, no wheezing Cardiovascular system: regular rate, s1, s2 Gastrointestinal system: Soft, nondistended, positive BS Central nervous system: CN2-12 grossly intact, strength intact Extremities: Perfused, no clubbing Skin: Normal skin turgor, no notable skin lesions seen Psychiatry: Mood normal // no visual hallucinations   Condition at discharge:  fair  The results of significant diagnostics from this hospitalization (including imaging, microbiology, ancillary and laboratory) are listed below for reference.   Imaging Studies: CT Abdomen Pelvis W Contrast  Result Date: 08/05/2022 CLINICAL DATA:  Acute abdominal pain. EXAM: CT ABDOMEN AND PELVIS WITH CONTRAST TECHNIQUE: Multidetector CT imaging of the abdomen and pelvis was performed using the standard protocol following bolus administration of intravenous contrast. RADIATION DOSE REDUCTION: This exam was performed according to the departmental dose-optimization program which includes automated exposure control, adjustment of the mA and/or kV according to patient size and/or use of iterative reconstruction technique. CONTRAST:  64m OMNIPAQUE IOHEXOL 350 MG/ML SOLN COMPARISON:  None Available. FINDINGS: Patient's IV infiltrated during the contrast administration. As such, there is suboptimal contrast opacification of the  intra-abdominal structures. Lower chest: No acute abnormality. Hepatobiliary: No focal liver abnormality is seen. Status post cholecystectomy. Pancreas: Unremarkable. No pancreatic ductal dilatation or surrounding inflammatory changes. Spleen: Normal in size without focal abnormality. Adrenals/Urinary Tract: Adrenal glands are unremarkable. Solitary LEFT kidney is unremarkable without stone or hydronephrosis. No perinephric fluid. RIGHT kidney is absent. Stomach/Bowel: No dilated large or small bowel loops. Appendix is not convincingly seen but there are no inflammatory changes about the cecum to suggest acute appendicitis. Stomach is decompressed, limiting characterization. There is marked thickening of the walls of the descending and transverse portions of the duodenum, with associated inflammation/fluid stranding in the para duodenal fat. There is also at least mild thickening of the walls of the distal duodenum and proximal jejunum in the LEFT upper quadrant. Remainder of the small bowel appears normal, without additional site of bowel wall inflammation. Vascular/Lymphatic: Aortic atherosclerosis. No abdominal aortic aneurysm. No enlarged lymph nodes are seen in the abdomen or pelvis. Reproductive: Uterus and bilateral adnexa are unremarkable. Other: No abscess collection or free intraperitoneal air. Musculoskeletal: Osseous structures are unremarkable. IMPRESSION: 1. Marked thickening of the walls of the descending and transverse portions of the duodenum, with associated inflammation/fluid stranding in the adjacent paraduodenal fat. Findings are consistent with an acute duodenitis, moderate to severe in degree based on the appearance, most likely infectious or inflammatory in nature. Duodenal ulcer could also cause this appearance. 2. Solitary LEFT kidney is unremarkable without stone or hydronephrosis. RIGHT kidney is absent. Patient's LEFT upper arm IV infiltrated during the contrast administration. On  physical exam of the LEFT upper extremity, there is no soft tissue tightness/firmness or skin redness identified. Patient denies significant pain. Patient's daughter was instructed to have her mother keep her arm elevated and to use either cool or warm compresses at the infiltration site if patient was discharged from the ER tonight. Patient's daughter was also instructed to monitor the LEFT arm for swelling, tightness, redness, blistering or pain and to return the patient to the emergency room if any of these findings were noted. Emergency room physician was also informed of the contrast extravasation at the LEFT upper arm IV site. In case patient will be admitted tonight, I entered the contrast extravasation order set via EPIC. Aortic Atherosclerosis (ICD10-I70.0). Electronically Signed   By: SFranki CabotM.D.   On: 08/05/2022 20:54    Microbiology: Results for orders placed or performed during the hospital encounter of 08/05/22  Resp panel by RT-PCR (RSV, Flu A&B, Covid) Anterior Nasal Swab     Status: None   Collection Time: 08/05/22  9:22 PM   Specimen: Anterior Nasal Swab  Result Value Ref Range Status   SARS Coronavirus 2 by RT PCR NEGATIVE NEGATIVE Final  Influenza A by PCR NEGATIVE NEGATIVE Final   Influenza B by PCR NEGATIVE NEGATIVE Final    Comment: (NOTE) The Xpert Xpress SARS-CoV-2/FLU/RSV plus assay is intended as an aid in the diagnosis of influenza from Nasopharyngeal swab specimens and should not be used as a sole basis for treatment. Nasal washings and aspirates are unacceptable for Xpert Xpress SARS-CoV-2/FLU/RSV testing.  Fact Sheet for Patients: EntrepreneurPulse.com.au  Fact Sheet for Healthcare Providers: IncredibleEmployment.be  This test is not yet approved or cleared by the [place] FDA and has been authorized for detection and/or diagnosis of SARS-CoV-2 by FDA under an Emergency Use Authorization (EUA). This EUA will  remain in effect (meaning this test can be used) for the duration of the COVID-19 declaration under Section 564(b)(1) of the Act, 21 U.S.C. section 360bbb-3(b)(1), unless the authorization is terminated or revoked.     Resp Syncytial Virus by PCR NEGATIVE NEGATIVE Final    Comment: (NOTE) Fact Sheet for Patients: EntrepreneurPulse.com.au  Fact Sheet for Healthcare Providers: IncredibleEmployment.be  This test is not yet approved or cleared by the [place] FDA and has been authorized for detection and/or diagnosis of SARS-CoV-2 by FDA under an Emergency Use Authorization (EUA). This EUA will remain in effect (meaning this test can be used) for the duration of the COVID-19 declaration under Section 564(b)(1) of the Act, 21 U.S.C. section 360bbb-3(b)(1), unless the authorization is terminated or revoked.  Performed at [facility], [place] [address], [place], [place] [address]     Labs: CBC: Recent Labs  Lab 08/05/22 1725 08/06/22 0308 08/07/22 0329 08/08/22 0331  WBC 21.4* 21.5* 14.8* 10.3  NEUTROABS  --   --  10.6*  --   HGB 14.1 13.2 11.9* 11.0*  HCT 41.8 37.5 36.1 33.5*  MCV 86.0 83.7 86.8 87.5  PLT 282 269 231 932   Basic Metabolic Panel: Recent Labs  Lab 08/05/22 1725 08/06/22 0308 08/07/22 0329 08/08/22 0331  NA 131* 131* 135 137  K 3.9 3.5 3.2* 3.5  CL 94* 100 101 103  CO2 '22 22 24 28  '$ GLUCOSE 407* 227* 285* 136*  BUN 43* 37* 24* 10  CREATININE 1.33* 1.20* 1.05* 0.92  CALCIUM 8.7* 8.5* 8.3* 8.4*   Liver Function Tests: Recent Labs  Lab 08/05/22 1725 08/08/22 0331  AST 19 18  ALT 18 12  ALKPHOS 99 69  BILITOT 1.1 0.7  PROT 7.0 5.2*  ALBUMIN 3.5 2.5*   CBG: Recent Labs  Lab 08/07/22 1145 08/07/22 1700 08/07/22 2125 08/08/22 0817 08/08/22 1226  GLUCAP 107* 160* 103* 118* 130*    Discharge time spent: less than 30 minutes.  Signed: Marylu Lund, MD [facility] 08/08/2022

## 2022-08-08 NOTE — Progress Notes (Signed)
Discharge instruction given to patient and daughter. Encouraged questions. Questions answered.

## 2022-08-08 NOTE — Plan of Care (Signed)

## 2022-08-08 NOTE — Plan of Care (Signed)
Problem: Education: Goal: Knowledge of General Education information will improve Description: Including pain rating scale, medication(s)/side effects and non-pharmacologic comfort measures 08/08/2022 1503 by Andrey Campanile, RN Outcome: Completed/Met 08/08/2022 1000 by Andrey Campanile, RN Outcome: Progressing   Problem: Health Behavior/Discharge Planning: Goal: Ability to manage health-related needs will improve 08/08/2022 1503 by Andrey Campanile, RN Outcome: Completed/Met 08/08/2022 1000 by Andrey Campanile, RN Outcome: Progressing   Problem: Clinical Measurements: Goal: Ability to maintain clinical measurements within normal limits will improve 08/08/2022 1503 by Andrey Campanile, RN Outcome: Completed/Met 08/08/2022 1000 by Andrey Campanile, RN Outcome: Progressing Goal: Will remain free from infection 08/08/2022 1503 by Andrey Campanile, RN Outcome: Completed/Met 08/08/2022 1000 by Andrey Campanile, RN Outcome: Progressing Goal: Diagnostic test results will improve 08/08/2022 1503 by Andrey Campanile, RN Outcome: Completed/Met 08/08/2022 1000 by Andrey Campanile, RN Outcome: Progressing Goal: Respiratory complications will improve 08/08/2022 1503 by Andrey Campanile, RN Outcome: Completed/Met 08/08/2022 1000 by Andrey Campanile, RN Outcome: Progressing Goal: Cardiovascular complication will be avoided 08/08/2022 1503 by Andrey Campanile, RN Outcome: Completed/Met 08/08/2022 1000 by Andrey Campanile, RN Outcome: Progressing   Problem: Activity: Goal: Risk for activity intolerance will decrease 08/08/2022 1503 by Andrey Campanile, RN Outcome: Completed/Met 08/08/2022 1000 by Andrey Campanile, RN Outcome: Progressing   Problem: Nutrition: Goal: Adequate nutrition will be maintained 08/08/2022 1503 by Andrey Campanile, RN Outcome: Completed/Met 08/08/2022 1000 by Andrey Campanile, RN Outcome: Progressing   Problem: Coping: Goal: Level of anxiety will decrease 08/08/2022  1503 by Andrey Campanile, RN Outcome: Completed/Met 08/08/2022 1000 by Andrey Campanile, RN Outcome: Progressing   Problem: Elimination: Goal: Will not experience complications related to bowel motility 08/08/2022 1503 by Andrey Campanile, RN Outcome: Completed/Met 08/08/2022 1000 by Andrey Campanile, RN Outcome: Progressing Goal: Will not experience complications related to urinary retention 08/08/2022 1503 by Andrey Campanile, RN Outcome: Completed/Met 08/08/2022 1000 by Andrey Campanile, RN Outcome: Progressing   Problem: Pain Managment: Goal: General experience of comfort will improve 08/08/2022 1503 by Andrey Campanile, RN Outcome: Completed/Met 08/08/2022 1000 by Andrey Campanile, RN Outcome: Progressing   Problem: Safety: Goal: Ability to remain free from injury will improve 08/08/2022 1503 by Andrey Campanile, RN Outcome: Completed/Met 08/08/2022 1000 by Andrey Campanile, RN Outcome: Progressing   Problem: Skin Integrity: Goal: Risk for impaired skin integrity will decrease 08/08/2022 1503 by Andrey Campanile, RN Outcome: Completed/Met 08/08/2022 1000 by Andrey Campanile, RN Outcome: Progressing   Problem: Education: Goal: Ability to describe self-care measures that may prevent or decrease complications (Diabetes Survival Skills Education) will improve 08/08/2022 1503 by Andrey Campanile, RN Outcome: Completed/Met 08/08/2022 1000 by Andrey Campanile, RN Outcome: Progressing Goal: Individualized Educational Video(s) 08/08/2022 1503 by Andrey Campanile, RN Outcome: Completed/Met 08/08/2022 1000 by Andrey Campanile, RN Outcome: Progressing   Problem: Coping: Goal: Ability to adjust to condition or change in health will improve 08/08/2022 1503 by Andrey Campanile, RN Outcome: Completed/Met 08/08/2022 1000 by Andrey Campanile, RN Outcome: Progressing   Problem: Fluid Volume: Goal: Ability to maintain a balanced intake and output will improve 08/08/2022 1503 by Andrey Campanile, RN Outcome: Completed/Met 08/08/2022 1000 by Andrey Campanile, RN Outcome: Progressing   Problem: Health Behavior/Discharge Planning: Goal: Ability to identify and utilize available resources and services will improve 08/08/2022 1503 by Andrey Campanile, RN Outcome: Completed/Met 08/08/2022 1000 by Andrey Campanile, RN Outcome: Progressing Goal: Ability to manage health-related needs will improve 08/08/2022 1503 by Andrey Campanile, RN Outcome: Completed/Met 08/08/2022 1000 by Andrey Campanile, RN Outcome: Progressing   Problem: Metabolic: Goal: Ability to maintain appropriate glucose levels will improve  08/08/2022 1503 by Andrey Campanile, RN Outcome: Completed/Met 08/08/2022 1000 by Andrey Campanile, RN Outcome: Progressing   Problem: Nutritional: Goal: Maintenance of adequate nutrition will improve 08/08/2022 1503 by Andrey Campanile, RN Outcome: Completed/Met 08/08/2022 1000 by Andrey Campanile, RN Outcome: Progressing Goal: Progress toward achieving an optimal weight will improve 08/08/2022 1503 by Andrey Campanile, RN Outcome: Completed/Met 08/08/2022 1000 by Andrey Campanile, RN Outcome: Progressing   Problem: Skin Integrity: Goal: Risk for impaired skin integrity will decrease 08/08/2022 1503 by Andrey Campanile, RN Outcome: Completed/Met 08/08/2022 1000 by Andrey Campanile, RN Outcome: Progressing   Problem: Tissue Perfusion: Goal: Adequacy of tissue perfusion will improve 08/08/2022 1503 by Andrey Campanile, RN Outcome: Completed/Met 08/08/2022 1000 by Andrey Campanile, RN Outcome: Progressing

## 2022-08-09 ENCOUNTER — Telehealth: Payer: Self-pay

## 2022-08-09 NOTE — Patient Outreach (Signed)
  Care Coordination TOC Note Transition Care Management Unsuccessful Follow-up Telephone Call  Date of discharge and from where:  08/08/22-[place]   Attempts:  1st Attempt  Reason for unsuccessful TCM follow-up call:  Left voice message     [facility] [place] [facility] Telephonic Care Management Coordinator Direct Phone: [phone] Toll Free: [phone] Fax: [phone]

## 2022-08-09 NOTE — Patient Outreach (Signed)
  Care Coordination TOC Note Transition Care Management Unsuccessful Follow-up Telephone Call  Date of discharge and from where:  08/08/22-[place]   Attempts:  2nd Attempt  Reason for unsuccessful TCM follow-up call:  No answer/busy      Enzo Montgomery, RN,BSN,CCM [place] [facility] Telephonic Care Management Coordinator Direct Phone: [phone] Toll Free: [phone] Fax: [phone]

## 2022-08-12 ENCOUNTER — Telehealth: Payer: Self-pay

## 2022-08-12 NOTE — Transitions of Care (Post Inpatient/ED Visit) (Signed)
   08/12/2022  Name: Tina Phelps MRN: [id number] DOB: 1946-04-09  Today's TOC FU Call Status: Today's TOC FU Call Status:: Successful TOC FU Call Competed TOC FU Call Complete Date: 08/12/22  Transition Care Management Follow-up Telephone Call Date of Discharge: 08/08/22 Discharge Facility: [facility] Type of Discharge: Inpatient Admission Primary Inpatient Discharge Diagnosis:: "duodenitis" How have you been since you were released from the hospital?: Better Any questions or concerns?: No  Items Reviewed: Did you receive and understand the discharge instructions provided?: Yes Medications obtained and verified?: No Medications Not Reviewed Reasons:: Other: (daughter not at home with meds to review them) Any new allergies since your discharge?: No Dietary orders reviewed?: Yes Type of Diet Ordered:: daughter states she still has patient on clear liquid diet and plans to slowly advance diet as patient is tolerating diet with no issues so far Do you have support at home?: Yes People in Home: child(ren), adult Name of Support/Comfort Primary Source: [place] and Equipment/Supplies: [place] Ordered?: No Any new equipment or medical supplies ordered?: No  Functional Questionnaire: Do you need assistance with bathing/showering or dressing?: No Do you need assistance with meal preparation?: Yes Do you need assistance with eating?: No Do you have difficulty maintaining continence: No Do you need assistance with getting out of bed/getting out of a chair/moving?: No Do you have difficulty managing or taking your medications?: Yes (daughter assists with med mgmt)  Folllow up appointments reviewed: PCP Follow-up appointment confirmed?: No (daughter prefers to call PCP herself today to make appt) [facility] Follow-up appointment confirmed?: Yes Date of Specialist follow-up appointment?: 09/12/22 Follow-Up Specialty Provider:: Vicie Mutters Do you need  transportation to your follow-up appointment?: No Do you understand care options if your condition(s) worsen?: Yes-patient verbalized understanding  SDOH Interventions Today    Flowsheet Row Most Recent Value  SDOH Interventions   Food Insecurity Interventions Intervention Not Indicated  Transportation Interventions Intervention Not Indicated       Interventions Today    Flowsheet Row Most Recent Value  Education Interventions   Education Provided Provided Verbal Education  Provided Verbal Education On Nutrition, When to see the doctor  Nutrition Interventions   Nutrition Discussed/Reviewed Nutrition Discussed  Safety Interventions   Safety Discussed/Reviewed Safety Discussed       TOC Interventions Today    Flowsheet Row Most Recent Value  TOC Interventions   TOC Interventions Discussed/Reviewed TOC Interventions Discussed        Enzo Montgomery, RN,BSN,CCM [place] [facility] Telephonic Care Management Coordinator Direct Phone: [phone] Toll Free: [phone] Fax: [phone]

## 2022-08-30 ENCOUNTER — Telehealth: Payer: Self-pay | Admitting: Licensed Clinical Social Worker

## 2022-08-30 NOTE — Patient Outreach (Deleted)
  Care Coordination   08/30/2022 Name: Tina Phelps MRN: [id number] DOB: 23-Jun-1946   Care Coordination Outreach Attempts:  An unsuccessful telephone outreach was attempted today to offer the patient information about available care coordination services as a benefit of their health plan.   Follow Up Plan:  Additional outreach attempts will be made to offer the patient care coordination information and services.   Encounter Outcome:  No Answer   Care Coordination Interventions:  No, not indicated    Christa See, MSW, [email or web address] Phone [phone] 12:58 PM

## 2022-08-30 NOTE — Patient Instructions (Signed)
Visit Information  Thank you for taking time to visit with me today. Please don't hesitate to contact me if I can be of assistance to you.   Following are the goals we discussed today:   Goals Addressed   None      If you are experiencing a Mental Health or [place] or need someone to talk to, please call the Suicide and Crisis Lifeline: 988 call 911   Patient verbalizes understanding of instructions and care plan provided today and agrees to view in [place]. Active MyChart status and patient understanding of how to access instructions and care plan via MyChart confirmed with patient.     No further follow up required:    Christa See, MSW, [email or web address] Phone [phone] 1:26 PM

## 2022-08-30 NOTE — Patient Outreach (Signed)
  Care Coordination   Initial Visit Note   08/30/2022 Name: Ahri Stegenga MRN: [id number] DOB: 17-Apr-1946  Madelin Reis is a 77 y.o. year old female who sees Charlott Rakes, MD for primary care. I spoke with  [name] Favela, daughter by phone today.  What matters to the patients health and wellness today? Patient reports no concerns or needs from Care Coordination team with health and wellness related to physical or mental heath.     SDOH assessments and interventions completed:  No     Care Coordination Interventions:  Yes, provided  Interventions Today    Flowsheet Row Most Recent Value  Chronic Disease   Chronic disease during today's visit Hypertension (HTN), Diabetes  General Interventions   General Interventions Discussed/Reviewed General Interventions Discussed, Communication with  [LCSW introduced self and discussed Care Coordination services. Pt is not in need of services at this time, per daughter. Agreed to contact PCP should anything change. LCSW reviewed upcoming appts]  Communication with --  [LCSW collaborated with Nelva Bush McGill to r/s HFU appt. Preferred days are Tues/Thurs mornings]       Follow up plan: No further intervention required.   Encounter Outcome:  Pt. Visit Completed   Christa See, MSW, [email or web address] Phone [phone] 1:25 PM

## 2022-09-06 ENCOUNTER — Ambulatory Visit: Payer: Medicare Other | Admitting: Physician Assistant

## 2022-09-09 NOTE — Progress Notes (Signed)
09/12/2022 Tina Phelps [id number] [birth year]  Referring provider: Charlott Rakes, MD Primary GI doctor: Dr. Bryan Lemma  ASSESSMENT AND PLAN:   Acute duodenitis with GERD and leukocytosis in the hospital Symptoms have improved with ABX and PPI BID No iron def in the hospital Continue PPI BID, GERD lifestyle discussed Patient's daughter with history of Hpylori, do not have diatherix in the office at this time, will get H pylori IGG and set up for EGD with colonoscopy to evaluate for PUD, occult malignancy I discussed risks of EGD with patient today, including risk of sedation, bleeding or perforation.  Patient provides understanding and gave verbal consent to proceed.  Screening colonoscopy We had a long discussion about the purpose of colon cancer screening and the different methods, including FIT test, Cologuard, virtual colonoscopy, and colonoscopy, including the benefits and their inherent limitations.  With shared decision making we have decided to proceed with the colonoscopy since patient appears to be in good health at this time.  We have discussed the risks of bleeding, infection, perforation, medication reactions,  and remote risk of death associated with colonoscopy.  All questions were answered and the patient acknowledges these risk and wishes to proceed.  Anemia, unspecified type Recheck CBC, consider folate/B12 Iron/ferritin normal in the hospital  Uncontrolled type 2 diabetes mellitus with hyperglycemia, with long-term current use of insulin ([place]) Monitor sugars  Patient Care Team: Charlott Rakes, MD as PCP - General (Family Medicine)  HISTORY OF PRESENT ILLNESS: 77 y.o. Tina Phelps female with a past medical history of  type 2 diabetes, hyperlipidemia, hypertension and others listed below, presents for hospital follow up.   Patient was in the hospital from 08/05/22 to 08/08/22, was in the hospital for acute duodenitis. 4 days of acute epigastric abdominal pain  worse with food and recurrent vomiting. Found to have leukocytosis 21.4, CT abdomen pelvis with contrast showed marked thickening walls descending and transverse portion of the duodenum, associated inflammation, fluid stranding in the adjacent paraduodenal fat. Findings are consistent with an acute duodenitis, moderate to severe in degree based on the appearance, most likely infectious or inflammatory in nature.  Duodenal ulcer could also cause this appearance. 2. Solitary LEFT kidney is unremarkable without stone or hydronephrosis. RIGHT kidney is absent. 3.  Aortic atherosclerosis.  Patient treated conservatively with Protonix 40 mg twice daily and Carafate 4 times daily with clinical improvement.  Supposed remain on Protonix 40 mg twice daily for 3 months. Patient did develop anemia Hgb 11.0.  MCV 87.5 Platelets 226 Iron 43 Ferritin 75 normal range, consider folate/B12. Here to discuss outpatient endoscopy and/or colonoscopy.   Can also do Diatherix for H. pylori.  Daughter is here to translate, states she is doing much better She has not had any nausea or vomiting. She has been eating well, has symptoms of uneasy stomach with tomato sauce or spicy foods.  No AB pain pain. No fever, chills.  No weight loss, she has gained weight since being out of the hospital. .  She has BM once daily, no black stool, no red stool.   Patient status post cholecystectomy.   No family history of stomach cancer, stomach ulcers, colon cancer. Has never had colonoscopy. No alcohol, rare ibuprofen once a month for headaches, denies tobacco use or drug use. Daughter was diagnosed and treated with H. pylori several years ago after a trip to [place], patient has never been tested.    She  reports that she has never smoked. She has never used  smokeless tobacco. She reports that she does not drink alcohol and does not use drugs.  RELEVANT LABS AND IMAGING: CBC    Component Value Date/Time   WBC 10.3 08/08/2022  0331   RBC 3.83 (L) 08/08/2022 0331   HGB 11.0 (L) 08/08/2022 0331   HGB 12.4 03/01/2021 0902   HCT 33.5 (L) 08/08/2022 0331   HCT 38.2 03/01/2021 0902   PLT 226 08/08/2022 0331   PLT 258 03/01/2021 0902   MCV 87.5 08/08/2022 0331   MCV 89 03/01/2021 0902   MCH 28.7 08/08/2022 0331   MCHC 32.8 08/08/2022 0331   RDW 12.4 08/08/2022 0331   RDW 12.1 03/01/2021 0902   LYMPHSABS 2.8 08/07/2022 0329   LYMPHSABS 2.6 03/01/2021 0902   MONOABS 1.2 (H) 08/07/2022 0329   EOSABS 0.0 08/07/2022 0329   EOSABS 0.1 03/01/2021 0902   BASOSABS 0.1 08/07/2022 0329   BASOSABS 0.1 03/01/2021 0902   Recent Labs    08/05/22 1725 08/06/22 0308 08/07/22 0329 08/08/22 0331  HGB 14.1 13.2 11.9* 11.0*     CMP     Component Value Date/Time   NA 140 09/11/2022 1146   K 4.5 09/11/2022 1146   CL 103 09/11/2022 1146   CO2 22 09/11/2022 1146   GLUCOSE 120 (H) 09/11/2022 1146   GLUCOSE 136 (H) 08/08/2022 0331   BUN 19 09/11/2022 1146   CREATININE 1.09 (H) 09/11/2022 1146   CREATININE 0.81 09/30/2014 1749   CALCIUM 10.2 09/11/2022 1146   PROT 6.8 09/11/2022 1146   ALBUMIN 4.2 09/11/2022 1146   AST 18 09/11/2022 1146   ALT 12 09/11/2022 1146   ALKPHOS 117 09/11/2022 1146   BILITOT 0.4 09/11/2022 1146   GFRNONAA >60 08/08/2022 0331   GFRNONAA 76 09/30/2014 1749   GFRAA 68 06/07/2020 1041   GFRAA 88 09/30/2014 1749      Latest Ref Rng & Units 09/11/2022   11:46 AM 08/08/2022    3:31 AM 08/05/2022    5:25 PM  Hepatic Function  Total Protein 6.0 - 8.5 g/dL 6.8  5.2  7.0   Albumin 3.8 - 4.8 g/dL 4.2  2.5  3.5   AST 0 - 40 IU/L '18  18  19   '$ ALT 0 - 32 IU/L '12  12  18   '$ Alk Phosphatase 44 - 121 IU/L 117  69  99   Total Bilirubin 0.0 - 1.2 mg/dL 0.4  0.7  1.1       Current Medications:   Current Outpatient Medications (Endocrine & Metabolic):    Insulin Glargine (BASAGLAR KWIKPEN) 100 UNIT/ML, Inject 32 Units into the skin daily.   metFORMIN (GLUCOPHAGE-XR) 500 MG 24 hr tablet, TAKE 2  TABLETS (1,000 MG TOTAL) BY MOUTH DAILY WITH BREAKFAST.  Current Outpatient Medications (Cardiovascular):    atorvastatin (LIPITOR) 80 MG tablet, Take 1 tablet (80 mg total) by mouth daily.   chlorthalidone (HYGROTON) 25 MG tablet, Take 1 tablet (25 mg total) by mouth daily.   lisinopril (ZESTRIL) 20 MG tablet, TAKE 1 TABLET (20 MG TOTAL) BY MOUTH DAILY.     Current Outpatient Medications (Other):    ACCU-CHEK FASTCLIX LANCETS MISC, Use as directed   Accu-Chek Softclix Lancets lancets, Use as instructed tid before meals   Blood Glucose Monitoring Suppl (ACCU-CHEK GUIDE) w/Device KIT, Use as directed tid   brimonidine (ALPHAGAN) 0.2 % ophthalmic solution, Instill 1 drop into both eyes twice a day (Will send in additional refills after patient's yearly exam)   ciclopirox (PENLAC) 8 %  solution, Apply topically at bedtime. Apply over nail and surrounding skin. Apply daily over previous coat. After seven (7) days, may remove with alcohol and continue cycle.   Continuous Blood Gluc Receiver (DEXCOM G6 RECEIVER) DEVI, Use to check blood sugar three times daily.   Continuous Blood Gluc Sensor (DEXCOM G6 SENSOR) MISC, Use to check blood sugar three times daily. Change sensor once every 10 days.   Continuous Blood Gluc Transmit (DEXCOM G6 TRANSMITTER) MISC, Use to check blood sugar three times daily. Change transmitter once every 90 days.   diclofenac sodium (VOLTAREN) 1 % GEL, Apply 2 g topically 4 (four) times daily. (Patient taking differently: Apply 2 g topically daily as needed (for shoulder pain).)   dorzolamide-timolol (COSOPT) 2-0.5 % ophthalmic solution, Instill 1 drop into both eyes twice a day (Will send in additional refills after patient's yearly exam)   gabapentin (NEURONTIN) 300 MG capsule, TAKE 1 CAPSULE (300 MG TOTAL) BY MOUTH AT BEDTIME. (Patient taking differently: Take 300 mg by mouth at bedtime.)   glucose blood (ACCU-CHEK GUIDE) test strip, USE AS DIRECTED : TEST BLOOD SUGARS THREE  TIMES A DAY   Insulin Pen Needle (B-D UF III MINI PEN NEEDLES) 31G X 5 MM MISC, USE AS DIRECTED   latanoprost (XALATAN) 0.005 % ophthalmic solution, Instill 1 drop into both eyes every evening   MURO 128 5 % ophthalmic ointment, Place 1 Application into both eyes every evening.   ondansetron (ZOFRAN-ODT) 8 MG disintegrating tablet, Place 1 tablet twice a day by translingual route as needed for 3 days.   pantoprazole (PROTONIX) 40 MG tablet, Take 1 tablet (40 mg total) by mouth daily.   sucralfate (CARAFATE) 1 g tablet, Take 1 tablet (1 g total) by mouth 4 (four) times daily for 28 days.  Medical History:  Past Medical History:  Diagnosis Date   Arthritis    Diabetes mellitus without complication ([place])    Glaucoma    Hypertension    Allergies:  Allergies  Allergen Reactions   Pork-Derived Products     Religious preference- non halal     Surgical History:  She  has a past surgical history that includes Cholecystectomy; Nephrectomy (Right); and Oophorectomy (Right). Family History:  Her family history includes Diabetes in her brother, daughter, and sister.  REVIEW OF SYSTEMS  : All other systems reviewed and negative except where noted in the History of Present Illness.  PHYSICAL EXAM: BP 139/78 (BP Location: Left Arm, Patient Position: Sitting, Cuff Size: Normal)   Pulse 75   Ht '5\' 3"'$  (1.6 m)   Wt 152 lb 8 oz (69.2 kg)   SpO2 99%   BMI 27.01 kg/m  General Appearance: Well nourished, in no apparent distress. Head:   Normocephalic and atraumatic. Eyes:  sclerae anicteric,conjunctive pink  Respiratory: Respiratory effort normal, BS equal bilaterally without rales, rhonchi, wheezing. Cardio: RRR with no MRGs. Peripheral pulses intact.  Abdomen: Soft,  Non-distended ,active bowel sounds. No tenderness . No masses. Rectal: Not evaluated Musculoskeletal: Full ROM, Normal gait. Without edema. Skin:  Dry and intact without significant lesions or rashes Neuro: Alert and  oriented  x4;  No focal deficits. Psych:  Cooperative. Normal mood and affect.    Vladimir Crofts, PA-C 8:57 AM

## 2022-09-11 ENCOUNTER — Other Ambulatory Visit: Payer: Self-pay

## 2022-09-11 ENCOUNTER — Inpatient Hospital Stay: Payer: Medicare Other | Admitting: Family Medicine

## 2022-09-11 ENCOUNTER — Encounter: Payer: Self-pay | Admitting: Family Medicine

## 2022-09-11 ENCOUNTER — Ambulatory Visit: Payer: Medicare Other | Attending: Family Medicine | Admitting: Family Medicine

## 2022-09-11 VITALS — BP 136/79 | HR 80 | Ht 63.0 in | Wt 153.0 lb

## 2022-09-11 DIAGNOSIS — K2980 Duodenitis without bleeding: Secondary | ICD-10-CM | POA: Diagnosis not present

## 2022-09-11 DIAGNOSIS — Z8719 Personal history of other diseases of the digestive system: Secondary | ICD-10-CM | POA: Diagnosis not present

## 2022-09-11 DIAGNOSIS — Z905 Acquired absence of kidney: Secondary | ICD-10-CM | POA: Diagnosis not present

## 2022-09-11 DIAGNOSIS — Z9049 Acquired absence of other specified parts of digestive tract: Secondary | ICD-10-CM | POA: Insufficient documentation

## 2022-09-11 DIAGNOSIS — I152 Hypertension secondary to endocrine disorders: Secondary | ICD-10-CM

## 2022-09-11 DIAGNOSIS — I10 Essential (primary) hypertension: Secondary | ICD-10-CM | POA: Insufficient documentation

## 2022-09-11 DIAGNOSIS — Z794 Long term (current) use of insulin: Secondary | ICD-10-CM

## 2022-09-11 DIAGNOSIS — E785 Hyperlipidemia, unspecified: Secondary | ICD-10-CM | POA: Diagnosis not present

## 2022-09-11 DIAGNOSIS — M1990 Unspecified osteoarthritis, unspecified site: Secondary | ICD-10-CM | POA: Diagnosis not present

## 2022-09-11 DIAGNOSIS — E1169 Type 2 diabetes mellitus with other specified complication: Secondary | ICD-10-CM | POA: Diagnosis not present

## 2022-09-11 DIAGNOSIS — E1159 Type 2 diabetes mellitus with other circulatory complications: Secondary | ICD-10-CM

## 2022-09-11 DIAGNOSIS — Z713 Dietary counseling and surveillance: Secondary | ICD-10-CM | POA: Diagnosis not present

## 2022-09-11 DIAGNOSIS — K219 Gastro-esophageal reflux disease without esophagitis: Secondary | ICD-10-CM | POA: Diagnosis not present

## 2022-09-11 DIAGNOSIS — E119 Type 2 diabetes mellitus without complications: Secondary | ICD-10-CM | POA: Diagnosis present

## 2022-09-11 MED ORDER — METFORMIN HCL ER 500 MG PO TB24
1000.0000 mg | ORAL_TABLET | Freq: Every day | ORAL | 1 refills | Status: DC
  Filled 2022-09-11: qty 180, fill #0
  Filled 2022-11-07: qty 180, 90d supply, fill #0

## 2022-09-11 MED ORDER — BASAGLAR KWIKPEN 100 UNIT/ML ~~LOC~~ SOPN
32.0000 [IU] | PEN_INJECTOR | Freq: Every day | SUBCUTANEOUS | 6 refills | Status: DC
  Filled 2022-09-11 – 2022-11-07 (×2): qty 30, 93d supply, fill #0

## 2022-09-11 MED ORDER — LISINOPRIL 20 MG PO TABS
20.0000 mg | ORAL_TABLET | Freq: Every day | ORAL | 1 refills | Status: DC
  Filled 2022-09-11: qty 90, fill #0
  Filled 2022-11-07: qty 90, 90d supply, fill #0

## 2022-09-11 MED ORDER — ACCU-CHEK SOFTCLIX LANCETS MISC
12 refills | Status: DC
  Filled 2022-09-11: qty 100, fill #0

## 2022-09-11 MED ORDER — ACCU-CHEK GUIDE W/DEVICE KIT
PACK | 0 refills | Status: DC
  Filled 2022-09-11: qty 1, fill #0

## 2022-09-11 MED ORDER — ACCU-CHEK GUIDE VI STRP
ORAL_STRIP | 12 refills | Status: DC
  Filled 2022-09-11: qty 100, 25d supply, fill #0

## 2022-09-11 NOTE — Progress Notes (Signed)
Subjective:  Patient ID: [name], female    DOB: 1945-10-14  Age: 77 y.o. MRN: [id number]  CC: Hospitalization Follow-up   HPI Tina Phelps is a 77 y.o. year old female with a history of  type 2 diabetes mellitus (A1c 9.5), hypertension, hyperlipidemia, osteoarthritis, GERD, s/p R nephrectomy (In [place] secondary to kidney cyst) who presents today accompanied by her daughter for a follow-up visit    Interval History:  Hospitalized for duodenitis from 08/05/2022 through 08/08/2022 after presenting with blood-streaked vomiting.  Hemoglobin was 14.1 on presentation and 11.0 at discharge, no endoscopy per GI.  Placed on PPI, IV fluids, Sucralfate and Lantus dose decreased due to decreased oral intake with recommendation to follow-up with GI outpatient. Procedures performed GI appointment comes up tomorrow. She denies presence of of hematochezia, hematemesis, abdominal pain.  Her A1c is 9.5 and she has not been checking her blood sugar as her glucometer has broken.  She is currently in the month of Ramadan but endorses adherence with her insulin and her medications.  She is currently administering 30 units of Lantus. Past Medical History:  Diagnosis Date   Arthritis    Diabetes mellitus without complication ([place])    Glaucoma    Hypertension     Past Surgical History:  Procedure Laterality Date   CHOLECYSTECTOMY     NEPHRECTOMY Right    OOPHORECTOMY Right     Family History  Problem Relation Age of Onset   Diabetes Sister    Diabetes Brother    Diabetes Daughter     Social History   Socioeconomic History   Marital status: Single    Spouse name: Not on file   Number of children: Not on file   Years of education: Not on file   Highest education level: Not on file  Occupational History   Not on file  Tobacco Use   Smoking status: Never   Smokeless tobacco: Never  Substance and Sexual Activity   Alcohol use: No   Drug use: No   Sexual activity: Not Currently  Other  Topics Concern   Not on file  Social History Narrative   Not on file   Social Determinants of Health   Financial Resource Strain: Not on file  Food Insecurity: No Food Insecurity (08/12/2022)   Hunger Vital Sign    Worried About Running Out of Food in the Last Year: Never true    Ran Out of Food in the Last Year: Never true  Transportation Needs: No Transportation Needs (08/12/2022)   PRAPARE - Hydrologist (Medical): No    Lack of Transportation (Non-Medical): No  Physical Activity: Not on file  Stress: Not on file  Social Connections: Not on file    Allergies  Allergen Reactions   Pork-Derived Products     Religious preference- non halal    Outpatient Medications Prior to Visit  Medication Sig Dispense Refill   ACCU-CHEK FASTCLIX LANCETS MISC Use as directed 100 each 3   atorvastatin (LIPITOR) 80 MG tablet Take 1 tablet (80 mg total) by mouth daily. 90 tablet 1   brimonidine (ALPHAGAN) 0.2 % ophthalmic solution Instill 1 drop into both eyes twice a day (Will send in additional refills after patient's yearly exam) 5 mL 0   chlorthalidone (HYGROTON) 25 MG tablet Take 1 tablet (25 mg total) by mouth daily. 90 tablet 1   ciclopirox (PENLAC) 8 % solution Apply topically at bedtime. Apply over nail and surrounding skin. Apply daily  over previous coat. After seven (7) days, may remove with alcohol and continue cycle. 6.6 mL 0   Continuous Blood Gluc Receiver (DEXCOM G6 RECEIVER) DEVI Use to check blood sugar three times daily. 1 each 0   Continuous Blood Gluc Sensor (DEXCOM G6 SENSOR) MISC Use to check blood sugar three times daily. Change sensor once every 10 days. 3 each 2   Continuous Blood Gluc Transmit (DEXCOM G6 TRANSMITTER) MISC Use to check blood sugar three times daily. Change transmitter once every 90 days. 1 each 1   diclofenac sodium (VOLTAREN) 1 % GEL Apply 2 g topically 4 (four) times daily. (Patient taking differently: Apply 2 g topically daily  as needed (for shoulder pain).) 300 g 3   dorzolamide-timolol (COSOPT) 2-0.5 % ophthalmic solution Instill 1 drop into both eyes twice a day (Will send in additional refills after patient's yearly exam) 10 mL 0   gabapentin (NEURONTIN) 300 MG capsule TAKE 1 CAPSULE (300 MG TOTAL) BY MOUTH AT BEDTIME. (Patient taking differently: Take 300 mg by mouth at bedtime.) 90 capsule 1   Insulin Pen Needle (B-D UF III MINI PEN NEEDLES) 31G X 5 MM MISC USE AS DIRECTED 100 each 1   latanoprost (XALATAN) 0.005 % ophthalmic solution Instill 1 drop into both eyes every evening 7.5 mL 12   MURO 128 5 % ophthalmic ointment Place 1 Application into both eyes every evening.     ondansetron (ZOFRAN-ODT) 8 MG disintegrating tablet Place 1 tablet twice a day by translingual route as needed for 3 days. 6 tablet 0   pantoprazole (PROTONIX) 40 MG tablet Take 1 tablet (40 mg total) by mouth daily. 90 tablet 0   Accu-Chek Softclix Lancets lancets Use as instructed tid before meals 100 each 12   Blood Glucose Monitoring Suppl (ACCU-CHEK GUIDE) w/Device KIT Use as directed tid 1 kit 0   glucose blood (ACCU-CHEK GUIDE) test strip Use as instructed tid 100 each 12   Insulin Glargine (BASAGLAR KWIKPEN) 100 UNIT/ML Inject 25 Units into the skin daily. 30 mL 6   lisinopril (ZESTRIL) 20 MG tablet TAKE 1 TABLET (20 MG TOTAL) BY MOUTH DAILY. (Patient taking differently: Take 20 mg by mouth daily.) 90 tablet 1   metFORMIN (GLUCOPHAGE-XR) 500 MG 24 hr tablet TAKE 2 TABLETS (1,000 MG TOTAL) BY MOUTH DAILY WITH BREAKFAST. 180 tablet 1   sucralfate (CARAFATE) 1 g tablet Take 1 tablet (1 g total) by mouth 4 (four) times daily for 28 days. 112 tablet 0   No facility-administered medications prior to visit.     ROS Review of Systems  Constitutional:  Negative for activity change and appetite change.  HENT:  Negative for sinus pressure and sore throat.   Respiratory:  Negative for chest tightness, shortness of breath and wheezing.    Cardiovascular:  Negative for chest pain and palpitations.  Gastrointestinal:  Negative for abdominal distention, abdominal pain and constipation.  Genitourinary: Negative.   Musculoskeletal: Negative.   Psychiatric/Behavioral:  Negative for behavioral problems and dysphoric mood.     Objective:  BP 136/79   Pulse 80   Ht '5\' 3"'$  (1.6 m)   Wt 153 lb (69.4 kg)   SpO2 100%   BMI 27.10 kg/m      09/11/2022   11:33 AM 09/11/2022   10:53 AM 08/08/2022    7:26 AM  BP/Weight  Systolic BP [id number]  Diastolic BP 79 87 73  Wt. (Lbs)  153   BMI  27.1 kg/m2  Physical Exam Constitutional:      Appearance: She is well-developed.  Cardiovascular:     Rate and Rhythm: Normal rate.     Heart sounds: Normal heart sounds. No murmur heard. Pulmonary:     Effort: Pulmonary effort is normal.     Breath sounds: Normal breath sounds. No wheezing or rales.  Chest:     Chest wall: No tenderness.  Abdominal:     General: Bowel sounds are normal. There is no distension.     Palpations: Abdomen is soft. There is no mass.     Tenderness: There is no abdominal tenderness.  Musculoskeletal:        General: Normal range of motion.     Right lower leg: No edema.     Left lower leg: No edema.  Neurological:     Mental Status: She is alert and oriented to person, place, and time.  Psychiatric:        Mood and Affect: Mood normal.        Latest Ref Rng & Units 08/08/2022    3:31 AM 08/07/2022    3:29 AM 08/06/2022    3:08 AM  CMP  Glucose 70 - 99 mg/dL 136  285  227   BUN 8 - 23 mg/dL 10  24  37   Creatinine 0.44 - 1.00 mg/dL 0.92  1.05  1.20   Sodium 135 - 145 mmol/L 137  135  131   Potassium 3.5 - 5.1 mmol/L 3.5  3.2  3.5   Chloride 98 - 111 mmol/L 103  101  100   CO2 22 - 32 mmol/L '28  24  22   '$ Calcium 8.9 - 10.3 mg/dL 8.4  8.3  8.5   Total Protein 6.5 - 8.1 g/dL 5.2     Total Bilirubin 0.3 - 1.2 mg/dL 0.7     Alkaline Phos 38 - 126 U/L 69     AST 15 - 41 U/L 18     ALT 0 - 44  U/L 12       Lipid Panel     Component Value Date/Time   CHOL 183 12/27/2021 1120   TRIG 82 12/27/2021 1120   HDL 62 12/27/2021 1120   CHOLHDL 3.6 03/01/2021 0902   CHOLHDL 3.2 12/15/2013 1011   VLDL 26 12/15/2013 1011   LDLCALC 106 (H) 12/27/2021 1120    CBC    Component Value Date/Time   WBC 10.3 08/08/2022 0331   RBC 3.83 (L) 08/08/2022 0331   HGB 11.0 (L) 08/08/2022 0331   HGB 12.4 03/01/2021 0902   HCT 33.5 (L) 08/08/2022 0331   HCT 38.2 03/01/2021 0902   PLT 226 08/08/2022 0331   PLT 258 03/01/2021 0902   MCV 87.5 08/08/2022 0331   MCV 89 03/01/2021 0902   MCH 28.7 08/08/2022 0331   MCHC 32.8 08/08/2022 0331   RDW 12.4 08/08/2022 0331   RDW 12.1 03/01/2021 0902   LYMPHSABS 2.8 08/07/2022 0329   LYMPHSABS 2.6 03/01/2021 0902   MONOABS 1.2 (H) 08/07/2022 0329   EOSABS 0.0 08/07/2022 0329   EOSABS 0.1 03/01/2021 0902   BASOSABS 0.1 08/07/2022 0329   BASOSABS 0.1 03/01/2021 0902    Lab Results  Component Value Date   HGBA1C 9.5 (H) 08/06/2022    Assessment & Plan:  1. Type 2 diabetes mellitus with other specified complication, with long-term current use of insulin (HCC) Controlled with A1c of 9.5 goal is less than 7.0 She is currently in the month  of Ramadan fast and I am concerned about hypoglycemia hence hesitant to increase her Lantus dose especially since she has no glucometer I have written a prescription for glucometer and advised on titrating up her Lantus dose by 2 units once she has her glucometer until blood sugars are at goal Counseled on Diabetic diet, my plate method, [id number] minutes of moderate intensity exercise/week Blood sugar logs with fasting goals of 80-120 mg/dl, random of less than 180 and in the event of sugars less than 60 mg/dl or greater than 400 mg/dl encouraged to notify the clinic. Advised on the need for annual eye exams, annual foot exams, Pneumonia vaccine. - Accu-Chek Softclix Lancets lancets; Use as instructed tid before meals   Dispense: 100 each; Refill: 12 - Blood Glucose Monitoring Suppl (ACCU-CHEK GUIDE) w/Device KIT; Use as directed tid  Dispense: 1 kit; Refill: 0 - glucose blood (ACCU-CHEK GUIDE) test strip; USE AS DIRECTED : TEST BLOOD SUGARS THREE TIMES A DAY  Dispense: 100 each; Refill: 12 - CMP14+EGFR - LP+Non-HDL Cholesterol - Insulin Glargine (BASAGLAR KWIKPEN) 100 UNIT/ML; Inject 32 Units into the skin daily.  Dispense: 30 mL; Refill: 6 - lisinopril (ZESTRIL) 20 MG tablet; TAKE 1 TABLET (20 MG TOTAL) BY MOUTH DAILY.  Dispense: 90 tablet; Refill: 1 - metFORMIN (GLUCOPHAGE-XR) 500 MG 24 hr tablet; TAKE 2 TABLETS (1,000 MG TOTAL) BY MOUTH DAILY WITH BREAKFAST.  Dispense: 180 tablet; Refill: 1  2. Hypertension associated with diabetes ([place]) Controlled Counseled on blood pressure goal of less than 130/80, low-sodium, DASH diet, medication compliance, 150 minutes of moderate intensity exercise per week. Discussed medication compliance, adverse effects. - lisinopril (ZESTRIL) 20 MG tablet; TAKE 1 TABLET (20 MG TOTAL) BY MOUTH DAILY.  Dispense: 90 tablet; Refill: 1  3. Duodenitis Asymptomatic Continue PPI and sucralfate Keep appointment with GI  4. Hyperlipidemia associated with type 2 diabetes mellitus (HCC) Controlled Low-cholesterol diet Continue statin    Meds ordered this encounter  Medications   Accu-Chek Softclix Lancets lancets    Sig: Use as instructed tid before meals    Dispense:  100 each    Refill:  12   Blood Glucose Monitoring Suppl (ACCU-CHEK GUIDE) w/Device KIT    Sig: Use as directed tid    Dispense:  1 kit    Refill:  0   glucose blood (ACCU-CHEK GUIDE) test strip    Sig: USE AS DIRECTED : TEST BLOOD SUGARS THREE TIMES A DAY    Dispense:  100 each    Refill:  12   Insulin Glargine (BASAGLAR KWIKPEN) 100 UNIT/ML    Sig: Inject 32 Units into the skin daily.    Dispense:  30 mL    Refill:  6   lisinopril (ZESTRIL) 20 MG tablet    Sig: TAKE 1 TABLET (20 MG TOTAL) BY MOUTH  DAILY.    Dispense:  90 tablet    Refill:  1   metFORMIN (GLUCOPHAGE-XR) 500 MG 24 hr tablet    Sig: TAKE 2 TABLETS (1,000 MG TOTAL) BY MOUTH DAILY WITH BREAKFAST.    Dispense:  180 tablet    Refill:  1    Follow-up: Return in about 3 months (around 12/12/2022) for Chronic medical conditions.       Charlott Rakes, MD, FAAFP. [facility] [place], [place]   09/11/2022, 11:52 AM

## 2022-09-11 NOTE — Patient Instructions (Signed)
Duodenitis  Duodenitis is when the lining of the first part of your small intestine (duodenum) becomes inflamed. It is often caused by a bacterial infection. You may also have open sores in your intestine called ulcers. Duodenitis may start all of a sudden and last for a short time (acute). It may also develop over time and last for months or years (chronic). What are the causes? The most common cause of this condition is an infection from a type of bacteria called Helicobacter pylori (H. pylori). Other causes include: Long-term use of NSAIDs, such as ibuprofen. Using too much alcohol. An infection of the small intestine caused by the Giardia parasite (giardiasis). Crohn's disease. Certain diseases of the body's defense system (immune system). Certain treatments for cancer. What increases the risk? You may be more likely to develop this condition if: You smoke cigarettes. You drink alcohol. You have a family history of duodenitis. You take NSAIDs. You eat a high-fat diet. What are the signs or symptoms? Symptoms of this condition may include: Epigastric pain. This is a gnawing or burning pain in the upper center of your abdomen. It may get worse when your stomach is empty and may get better after you eat. Cramps in your abdomen. Nausea and vomiting. Bloody vomit. Stools that are bloody, dark, or look like tar. Diarrhea. Weight loss. Feeling tired (fatigue). How is this diagnosed? This condition may be diagnosed based on your medical history and a physical exam. You may also have tests, such as: Blood tests. Stool tests. A test that checks the gases in your breath. An X-ray that is done after you swallow a liquid called barium. The barium makes your digestive tract easier to see. Endoscopy. This is an exam that is done by putting a thin tube with a camera on the end (endoscope) down your throat. A biopsy may be taken. This is when a sample of tissue from your duodenum is removed  with the scope and looked at under a microscope to check if the tissue is infected or inflamed. How is this treated? Treatment depends on the cause of your condition. Treatment may include: Antibiotics to treat H. pylori infection. Stopping your intake of NSAIDs. Medicine to reduce the amount of acid your stomach makes. Medicine to treat other conditions, such as Crohn's disease or giardiasis. Surgery to treat severe inflammation that causes scarring or severe bleeding. Follow these instructions at home: Medicines Take over-the-counter and prescription medicines only as told by your health care provider. If you were prescribed antibiotics, take them as told by your health care provider. Do not stop using the antibiotic even if you start to feel better. Eating and drinking  Eat small, frequent meals. Do not drink alcohol. Drink enough water to keep your urine pale yellow. Follow instructions from your health care provider about what you may eat and drink. You may be asked to avoid: Drinks with caffeine. Chocolate. Peppermint or mint-flavored food or drinks. Garlic or onions. Spicy foods. Citrus fruits. Tomato-based foods. Fatty or fried foods. General instructions Do not use any products that contain nicotine or tobacco. These products include cigarettes, chewing tobacco, and vaping devices, such as e-cigarettes. If you need help quitting, ask your health care provider. Contact a health care provider if: You have a fever. Your symptoms come back, get worse, or do not get better with treatment. You feel dizzy or light-headed. You vomit blood. You have a lot of blood in your stool. You have severe pain in your abdomen. Your  abdomen swells and is painful. This information is not intended to replace advice given to you by your health care provider. Make sure you discuss any questions you have with your health care provider. Document Revised: 12/31/2021 Document Reviewed:  12/31/2021 Elsevier Patient Education  [place].

## 2022-09-12 ENCOUNTER — Other Ambulatory Visit (INDEPENDENT_AMBULATORY_CARE_PROVIDER_SITE_OTHER): Payer: Medicare Other

## 2022-09-12 ENCOUNTER — Encounter: Payer: Self-pay | Admitting: Physician Assistant

## 2022-09-12 ENCOUNTER — Other Ambulatory Visit: Payer: Self-pay

## 2022-09-12 ENCOUNTER — Other Ambulatory Visit: Payer: Self-pay | Admitting: Family Medicine

## 2022-09-12 ENCOUNTER — Ambulatory Visit (INDEPENDENT_AMBULATORY_CARE_PROVIDER_SITE_OTHER): Payer: Medicare Other | Admitting: Physician Assistant

## 2022-09-12 VITALS — BP 139/78 | HR 75 | Ht 63.0 in | Wt 152.5 lb

## 2022-09-12 DIAGNOSIS — Z1211 Encounter for screening for malignant neoplasm of colon: Secondary | ICD-10-CM

## 2022-09-12 DIAGNOSIS — K2980 Duodenitis without bleeding: Secondary | ICD-10-CM

## 2022-09-12 DIAGNOSIS — D649 Anemia, unspecified: Secondary | ICD-10-CM | POA: Diagnosis not present

## 2022-09-12 DIAGNOSIS — K219 Gastro-esophageal reflux disease without esophagitis: Secondary | ICD-10-CM

## 2022-09-12 DIAGNOSIS — Z794 Long term (current) use of insulin: Secondary | ICD-10-CM

## 2022-09-12 DIAGNOSIS — E1165 Type 2 diabetes mellitus with hyperglycemia: Secondary | ICD-10-CM

## 2022-09-12 LAB — CBC WITH DIFFERENTIAL/PLATELET
Basophils Absolute: 0 10*3/uL (ref 0.0–0.1)
Basophils Relative: 0.6 % (ref 0.0–3.0)
Eosinophils Absolute: 0.1 10*3/uL (ref 0.0–0.7)
Eosinophils Relative: 0.9 % (ref 0.0–5.0)
HCT: 37.8 % (ref 36.0–46.0)
Hemoglobin: 12.5 g/dL (ref 12.0–15.0)
Lymphocytes Relative: 30.3 % (ref 12.0–46.0)
Lymphs Abs: 2.5 10*3/uL (ref 0.7–4.0)
MCHC: 33 g/dL (ref 30.0–36.0)
MCV: 86.9 fl (ref 78.0–100.0)
Monocytes Absolute: 0.5 10*3/uL (ref 0.1–1.0)
Monocytes Relative: 6.1 % (ref 3.0–12.0)
Neutro Abs: 5 10*3/uL (ref 1.4–7.7)
Neutrophils Relative %: 62.1 % (ref 43.0–77.0)
Platelets: 264 10*3/uL (ref 150.0–400.0)
RBC: 4.34 Mil/uL (ref 3.87–5.11)
RDW: 13.9 % (ref 11.5–15.5)
WBC: 8.1 10*3/uL (ref 4.0–10.5)

## 2022-09-12 LAB — CMP14+EGFR
ALT: 12 IU/L (ref 0–32)
AST: 18 IU/L (ref 0–40)
Albumin/Globulin Ratio: 1.6 (ref 1.2–2.2)
Albumin: 4.2 g/dL (ref 3.8–4.8)
Alkaline Phosphatase: 117 IU/L (ref 44–121)
BUN/Creatinine Ratio: 17 (ref 12–28)
BUN: 19 mg/dL (ref 8–27)
Bilirubin Total: 0.4 mg/dL (ref 0.0–1.2)
CO2: 22 mmol/L (ref 20–29)
Calcium: 10.2 mg/dL (ref 8.7–10.3)
Chloride: 103 mmol/L (ref 96–106)
Creatinine, Ser: 1.09 mg/dL — ABNORMAL HIGH (ref 0.57–1.00)
Globulin, Total: 2.6 g/dL (ref 1.5–4.5)
Glucose: 120 mg/dL — ABNORMAL HIGH (ref 70–99)
Potassium: 4.5 mmol/L (ref 3.5–5.2)
Sodium: 140 mmol/L (ref 134–144)
Total Protein: 6.8 g/dL (ref 6.0–8.5)
eGFR: 53 mL/min/{1.73_m2} — ABNORMAL LOW (ref >59)

## 2022-09-12 LAB — LP+NON-HDL CHOLESTEROL
Cholesterol, Total: 219 mg/dL — ABNORMAL HIGH (ref 100–199)
HDL: 64 mg/dL (ref >39)
LDL Chol Calc (NIH): 138 mg/dL — ABNORMAL HIGH (ref 0–99)
Total Non-HDL-Chol (LDL+VLDL): 155 mg/dL — ABNORMAL HIGH (ref 0–129)
Triglycerides: 98 mg/dL (ref 0–149)
VLDL Cholesterol Cal: 17 mg/dL (ref 5–40)

## 2022-09-12 LAB — H. PYLORI ANTIBODY, IGG: H Pylori IgG: POSITIVE — AB

## 2022-09-12 MED ORDER — ROSUVASTATIN CALCIUM 20 MG PO TABS
20.0000 mg | ORAL_TABLET | Freq: Every day | ORAL | 1 refills | Status: DC
  Filled 2022-09-12: qty 90, 90d supply, fill #0

## 2022-09-12 MED ORDER — PLENVU 140 G PO SOLR
1.0000 | Freq: Once | ORAL | 0 refills | Status: AC
  Filled 2022-09-12: qty 3, 2d supply, fill #0

## 2022-09-12 NOTE — Patient Instructions (Addendum)
Your provider has requested that you go to the basement level for lab work before leaving today. Press "B" on the elevator. The lab is located at the first door on the left as you exit the elevator.  You have been scheduled for an endoscopy and colonoscopy. Please follow the written instructions given to you at your visit today. Please pick up your prep supplies at the pharmacy within the next 1-3 days. If you use inhalers (even only as needed), please bring them with you on the day of your procedure.   Due to recent changes in healthcare laws, you may see the results of your imaging and laboratory studies on MyChart before your provider has had a chance to review them.  We understand that in some cases there may be results that are confusing or concerning to you. Not all laboratory results come back in the same time frame and the provider may be waiting for multiple results in order to interpret others.  Please give [place] 48 hours in order for your provider to thoroughly review all the results before contacting the office for clarification of your results.    _______________________________________________________  If your blood pressure at your visit was 140/90 or greater, please contact your primary care physician to follow up on this.  _______________________________________________________  If you are age 42 or older, your body mass index should be between 23-30. Your Body mass index is 27.01 kg/m. If this is out of the aforementioned range listed, please consider follow up with your Primary Care Provider.  If you are age 59 or younger, your body mass index should be between 19-25. Your Body mass index is 27.01 kg/m. If this is out of the aformentioned range listed, please consider follow up with your Primary Care Provider.   ________________________________________________________  The [facility] providers would like to encourage you to use [facility] to communicate with providers for  non-urgent requests or questions.  Due to long hold times on the telephone, sending your provider a message by [facility] may be a faster and more efficient way to get a response.  Please allow 48 business hours for a response.  Please remember that this is for non-urgent requests.  _______________________________________________________   I appreciate the  opportunity to care for you  Thank You   [facility]

## 2022-09-13 ENCOUNTER — Other Ambulatory Visit: Payer: Self-pay

## 2022-09-16 NOTE — Progress Notes (Signed)
Agree with the assessment and plan as outlined by Amanda Collier, PA-C. ? ?Odalys Win, DO, FACG ? ?

## 2022-09-19 ENCOUNTER — Other Ambulatory Visit: Payer: Self-pay

## 2022-09-19 MED ORDER — METRONIDAZOLE 500 MG PO TABS
500.0000 mg | ORAL_TABLET | Freq: Two times a day (BID) | ORAL | 0 refills | Status: DC
  Filled 2022-09-19: qty 20, 10d supply, fill #0

## 2022-09-19 MED ORDER — DOXYCYCLINE HYCLATE 100 MG PO CAPS
100.0000 mg | ORAL_CAPSULE | Freq: Two times a day (BID) | ORAL | 0 refills | Status: DC
  Filled 2022-09-19: qty 28, 14d supply, fill #0

## 2022-09-19 MED ORDER — BISMUTH 262 MG PO CHEW
524.0000 mg | CHEWABLE_TABLET | Freq: Four times a day (QID) | ORAL | 0 refills | Status: DC
  Filled 2022-09-19: qty 112, fill #0

## 2022-09-19 MED ORDER — PANTOPRAZOLE SODIUM 40 MG PO TBEC
40.0000 mg | DELAYED_RELEASE_TABLET | Freq: Two times a day (BID) | ORAL | 0 refills | Status: DC
  Filled 2022-09-19 – 2023-02-28 (×2): qty 28, 14d supply, fill #0

## 2022-09-20 ENCOUNTER — Other Ambulatory Visit: Payer: Self-pay

## 2022-09-24 ENCOUNTER — Telehealth: Payer: Self-pay | Admitting: Family Medicine

## 2022-09-24 NOTE — Telephone Encounter (Signed)
Contacted Tina Phelps to schedule their annual wellness visit. Appointment made for 10/01/22.  Tina Phelps AWV direct phone # [phone]

## 2022-10-01 ENCOUNTER — Ambulatory Visit: Payer: Medicare Other | Attending: Family Medicine

## 2022-10-01 VITALS — Ht 63.0 in | Wt 150.0 lb

## 2022-10-01 DIAGNOSIS — Z0000 Encounter for general adult medical examination without abnormal findings: Secondary | ICD-10-CM

## 2022-10-01 NOTE — Progress Notes (Signed)
I connected with  Tina Phelps on 10/01/22 by a audio enabled telemedicine application and verified that I am speaking with the correct person using two identifiers. Daughter Tina Phelps also on call.  Patient Location: Home  Provider Location: Office/Clinic  I discussed the limitations of evaluation and management by telemedicine. The patient expressed understanding and agreed to proceed.  Subjective:   Tina Phelps is a 77 y.o. female who presents for an Initial Medicare Annual Wellness Visit.  Review of Systems     Cardiac Risk Factors include: advanced age (>56men, >24 women);diabetes mellitus;dyslipidemia;hypertension     Objective:    Today's Vitals   10/01/22 1331  Weight: 150 lb (68 kg)  Height: 5\' 3"  (1.6 m)   Body mass index is 26.57 kg/m.     10/01/2022    1:41 PM 08/06/2022   12:56 AM 08/05/2022    3:33 PM 12/21/2019    2:35 PM 11/15/2016   10:43 AM 01/26/2016    3:23 PM 05/02/2015   10:35 AM  Advanced Directives  Does Patient Have a Medical Advance Directive? No  No No No No No  Would patient like information on creating a medical advance directive?  No - Patient declined    No - patient declined information     Current Medications (verified) Outpatient Encounter Medications as of 10/01/2022  Medication Sig   ACCU-CHEK FASTCLIX LANCETS MISC Use as directed   Accu-Chek Softclix Lancets lancets Use as instructed tid before meals   Bismuth 262 MG CHEW Chew 524 mg by mouth in the morning, at noon, in the evening, and at bedtime.   Blood Glucose Monitoring Suppl (ACCU-CHEK GUIDE) w/Device KIT Use as directed tid   brimonidine (ALPHAGAN) 0.2 % ophthalmic solution Instill 1 drop into both eyes twice a day (Will send in additional refills after patient's yearly exam)   chlorthalidone (HYGROTON) 25 MG tablet Take 1 tablet (25 mg total) by mouth daily.   Continuous Blood Gluc Receiver (DEXCOM G6 RECEIVER) DEVI Use to check blood sugar three times daily.   Continuous Blood Gluc  Sensor (DEXCOM G6 SENSOR) MISC Use to check blood sugar three times daily. Change sensor once every 10 days.   Continuous Blood Gluc Transmit (DEXCOM G6 TRANSMITTER) MISC Use to check blood sugar three times daily. Change transmitter once every 90 days.   diclofenac sodium (VOLTAREN) 1 % GEL Apply 2 g topically 4 (four) times daily. (Patient taking differently: Apply 2 g topically daily as needed (for shoulder pain).)   dorzolamide-timolol (COSOPT) 2-0.5 % ophthalmic solution Instill 1 drop into both eyes twice a day (Will send in additional refills after patient's yearly exam)   doxycycline (VIBRAMYCIN) 100 MG capsule Take 1 capsule (100 mg total) by mouth 2 (two) times daily. (Patient not taking: Reported on 10/01/2022)   gabapentin (NEURONTIN) 300 MG capsule TAKE 1 CAPSULE (300 MG TOTAL) BY MOUTH AT BEDTIME. (Patient taking differently: Take 300 mg by mouth at bedtime.)   glucose blood (ACCU-CHEK GUIDE) test strip USE AS DIRECTED : TEST BLOOD SUGARS THREE TIMES A DAY   Insulin Glargine (BASAGLAR KWIKPEN) 100 UNIT/ML Inject 32 Units into the skin daily.   Insulin Pen Needle (B-D UF III MINI PEN NEEDLES) 31G X 5 MM MISC USE AS DIRECTED   latanoprost (XALATAN) 0.005 % ophthalmic solution Instill 1 drop into both eyes every evening   lisinopril (ZESTRIL) 20 MG tablet TAKE 1 TABLET (20 MG TOTAL) BY MOUTH DAILY.   metFORMIN (GLUCOPHAGE-XR) 500 MG 24 hr tablet TAKE  2 TABLETS (1,000 MG TOTAL) BY MOUTH DAILY WITH BREAKFAST.   metroNIDAZOLE (FLAGYL) 500 MG tablet Take 1 tablet (500 mg total) by mouth 2 (two) times daily. (Patient not taking: Reported on 10/01/2022)   MURO 128 5 % ophthalmic ointment Place 1 Application into both eyes every evening.   pantoprazole (PROTONIX) 40 MG tablet Take 1 tablet (40 mg total) by mouth daily.   pantoprazole (PROTONIX) 40 MG tablet Take 1 tablet (40 mg total) by mouth 2 (two) times daily. (Patient not taking: Reported on 10/01/2022)   rosuvastatin (CRESTOR) 20 MG tablet Take 1  tablet (20 mg total) by mouth daily.   ciclopirox (PENLAC) 8 % solution Apply topically at bedtime. Apply over nail and surrounding skin. Apply daily over previous coat. After seven (7) days, may remove with alcohol and continue cycle. (Patient not taking: Reported on 10/01/2022)   ondansetron (ZOFRAN-ODT) 8 MG disintegrating tablet Place 1 tablet twice a day by translingual route as needed for 3 days. (Patient not taking: Reported on 10/01/2022)   sucralfate (CARAFATE) 1 g tablet Take 1 tablet (1 g total) by mouth 4 (four) times daily for 28 days.   No facility-administered encounter medications on file as of 10/01/2022.    Allergies (verified) Pork-derived products   History: Past Medical History:  Diagnosis Date   Arthritis    Diabetes mellitus without complication    Glaucoma    Hypertension    Past Surgical History:  Procedure Laterality Date   CHOLECYSTECTOMY     NEPHRECTOMY Right    OOPHORECTOMY Right    Family History  Problem Relation Age of Onset   Diabetes Sister    Diabetes Brother    Diabetes Daughter    Social History   Socioeconomic History   Marital status: Single    Spouse name: Not on file   Number of children: Not on file   Years of education: Not on file   Highest education level: Not on file  Occupational History   Not on file  Tobacco Use   Smoking status: Never   Smokeless tobacco: Never  Vaping Use   Vaping Use: Never used  Substance and Sexual Activity   Alcohol use: No   Drug use: No   Sexual activity: Not Currently  Other Topics Concern   Not on file  Social History Narrative   Not on file   Social Determinants of Health   Financial Resource Strain: Low Risk  (10/01/2022)   Overall Financial Resource Strain (CARDIA)    Difficulty of Paying Living Expenses: Not hard at all  Food Insecurity: No Food Insecurity (10/01/2022)   Hunger Vital Sign    Worried About Running Out of Food in the Last Year: Never true    [place] in the Last  Year: Never true  Transportation Needs: No Transportation Needs (10/01/2022)   PRAPARE - Hydrologist (Medical): No    Lack of Transportation (Non-Medical): No  Physical Activity: Insufficiently Active (10/01/2022)   Exercise Vital Sign    Days of Exercise per Week: 5 days    Minutes of Exercise per Session: 20 min  Stress: No Stress Concern Present (10/01/2022)   [place]    Feeling of Stress : Not at all  Social Connections: Not on file    Tobacco Counseling Counseling given: Not Answered   Clinical Intake:  Pre-visit preparation completed: Yes  Pain : No/denies pain  Nutritional Status: BMI 25 -29 Overweight Nutritional Risks: None Diabetes: Yes  How often do you need to have someone help you when you read instructions, pamphlets, or other written materials from your doctor or pharmacy?: 4 - Often  Diabetic? Yes Nutrition Risk Assessment:  Has the patient had any N/V/D within the last 2 months?  No  Does the patient have any non-healing wounds?  No  Has the patient had any unintentional weight loss or weight gain?  No   Diabetes:  Is the patient diabetic?  Yes  If diabetic, was a CBG obtained today?  No  Did the patient bring in their glucometer from home?  No  How often do you monitor your CBG's? Twice daily.   Financial Strains and Diabetes Management:  Are you having any financial strains with the device, your supplies or your medication? No .  Does the patient want to be seen by Chronic Care Management for management of their diabetes?  No  Would the patient like to be referred to a Nutritionist or for Diabetic Management?  No   Diabetic Exams:  Diabetic Eye Exam: Overdue for diabetic eye exam. Pt has been advised about the importance in completing this exam. Patient advised to call and schedule an eye exam. Diabetic Foot Exam: Overdue, Pt has been advised about  the importance in completing this exam. Pt is scheduled for diabetic foot exam on next appointment.   Interpreter Needed?: No  Comments: daughter is interpreting Information entered by :: NAllen LPN   Activities of Daily Living    10/01/2022    1:42 PM 08/06/2022   12:53 AM  In your present state of health, do you have any difficulty performing the following activities:  Hearing? 0   Vision? 0   Difficulty concentrating or making decisions? 0   Walking or climbing stairs? 0   Dressing or bathing? 0   Doing errands, shopping? 0 1  Preparing Food and eating ? N   Using the Toilet? N   In the past six months, have you accidently leaked urine? N   Do you have problems with loss of bowel control? N   Managing your Medications? N   Managing your Finances? N   Housekeeping or managing your Housekeeping? N     Patient Care Team: Charlott Rakes, MD as PCP - General (Family Medicine)  Indicate any recent Medical Services you may have received from other than [facility] providers in the past year (date may be approximate).     Assessment:   This is a routine wellness examination for Tina.  Hearing/Vision screen Vision Screening - Comments:: Regular eye exams,   Dietary issues and exercise activities discussed: Current Exercise Habits: Home exercise routine, Type of exercise: walking, Time (Minutes): 20, Frequency (Times/Week): 5, Weekly Exercise (Minutes/Week): 100   Goals Addressed             This Visit's Progress    Patient Stated       10/01/2022, no goals       Depression Screen    10/01/2022    1:42 PM 02/11/2022    2:14 PM 12/27/2021   10:34 AM 06/05/2021   10:23 AM 06/07/2020    9:47 AM 12/21/2019    2:35 PM 10/29/2017   11:44 AM  PHQ 2/9 Scores  PHQ - 2 Score 0 0 0 0 0 0 0  PHQ- 9 Score  0 0 0 0 2 0    Fall Risk    10/01/2022  1:41 PM 12/27/2021   10:28 AM 06/05/2021    9:22 AM 06/07/2020    9:41 AM 12/21/2019    2:34 PM  Fall Risk   Falls in the past year? 0 0  0 0 0  Number falls in past yr: 0 0  0   Injury with Fall? 0 0  0   Risk for fall due to : Medication side effect    No Fall Risks  Follow up Falls prevention discussed;Education provided;Falls evaluation completed        FALL RISK PREVENTION PERTAINING TO THE HOME:  Any stairs in or around the home? Yes  If so, are there any without handrails? No  Home free of loose throw rugs in walkways, pet beds, electrical cords, etc? Yes  Adequate lighting in your home to reduce risk of falls? Yes   ASSISTIVE DEVICES UTILIZED TO PREVENT FALLS:  Life alert? No  Use of a cane, walker or w/c? No  Grab bars in the bathroom? No  Shower chair or bench in shower? No  Elevated toilet seat or a handicapped toilet? No   TIMED UP AND GO:  Was the test performed? No .      Cognitive Function:  6 CIT not administered per language barrier. Patient appeared cognitive per conversation through daughter.        Immunizations Immunization History  Administered Date(s) Administered   Influenza,inj,Quad PF,6+ Mos 05/02/2015, 06/05/2021   PFIZER(Purple Top)SARS-COV-2 Vaccination 12/21/2019, 01/11/2020, 08/20/2020   PNEUMOCOCCAL CONJUGATE-20 06/05/2021   Pneumococcal Conjugate-13 06/07/2020    TDAP status: Due, Education has been provided regarding the importance of this vaccine. Advised may receive this vaccine at local pharmacy or Health Dept. Aware to provide a copy of the vaccination record if obtained from local pharmacy or Health Dept. Verbalized acceptance and understanding.  Flu Vaccine status: Up to date  Pneumococcal vaccine status: Up to date  Covid-19 vaccine status: Completed vaccines  Qualifies for Shingles Vaccine? Yes   Zostavax completed No   Shingrix Completed?: No.    Education has been provided regarding the importance of this vaccine. Patient has been advised to call insurance company to determine out of pocket expense if they have not yet received this vaccine. Advised may  also receive vaccine at local pharmacy or Health Dept. Verbalized acceptance and understanding.  Screening Tests Health Maintenance  Topic Date Due   Medicare Annual Wellness (AWV)  Never done   DTaP/Tdap/Td (1 - Tdap) Never done   MAMMOGRAM  Never done   Zoster Vaccines- Shingrix (1 of 2) Never done   DEXA SCAN  Never done   FOOT EXAM  04/15/2019   COVID-19 Vaccine (4 - 2023-24 season) 03/01/2022   OPHTHALMOLOGY EXAM  06/04/2022   COLONOSCOPY (Pts 45-14yrs Insurance coverage will need to be confirmed)  11/16/2026 (Originally 02/25/1991)   Diabetic kidney evaluation - Urine ACR  12/28/2022   INFLUENZA VACCINE  01/30/2023   HEMOGLOBIN A1C  02/04/2023   Diabetic kidney evaluation - eGFR measurement  09/11/2023   Pneumonia Vaccine 71+ Years old  Completed   Hepatitis C Screening  Completed   HPV VACCINES  Aged Out    Health Maintenance  Health Maintenance Due  Topic Date Due   Medicare Annual Wellness (AWV)  Never done   DTaP/Tdap/Td (1 - Tdap) Never done   MAMMOGRAM  Never done   Zoster Vaccines- Shingrix (1 of 2) Never done   DEXA SCAN  Never done   FOOT EXAM  04/15/2019  COVID-19 Vaccine (4 - 2023-24 season) 03/01/2022   OPHTHALMOLOGY EXAM  06/04/2022    Colorectal cancer screening: scheduled for May  Mammogram status: decline  Bone Density status: due  Lung Cancer Screening: (Low Dose CT Chest recommended if Age 11-80 years, 30 pack-year currently smoking OR have quit w/in 15years.) does not qualify.   Lung Cancer Screening Referral: no  Additional Screening:  Hepatitis C Screening: does qualify; Completed 11/15/2016  Vision Screening: Recommended annual ophthalmology exams for early detection of glaucoma and other disorders of the eye. Is the patient up to date with their annual eye exam?  Yes  Who is the provider or what is the name of the office in which the patient attends annual eye exams? Can't remember name If pt is not established with a provider, would  they like to be referred to a provider to establish care? No .   Dental Screening: Recommended annual dental exams for proper oral hygiene  Community Resource Referral / Chronic Care Management: CRR required this visit?  No   CCM required this visit?  No      Plan:     I have personally reviewed and noted the following in the patient's chart:   Medical and social history Use of alcohol, tobacco or illicit drugs  Current medications and supplements including opioid prescriptions. Patient is not currently taking opioid prescriptions. Functional ability and status Nutritional status Physical activity Advanced directives List of other physicians Hospitalizations, surgeries, and ER visits in previous 12 months Vitals Screenings to include cognitive, depression, and falls Referrals and appointments  In addition, I have reviewed and discussed with patient certain preventive protocols, quality metrics, and best practice recommendations. A written personalized care plan for preventive services as well as general preventive health recommendations were provided to patient.     Kellie Simmering, LPN   [id number]   Nurse Notes: none  Due to this being a virtual visit, the after visit summary with patients personalized plan was offered to patient via mail or my-chart.to pick up at office at next visit

## 2022-10-01 NOTE — Patient Instructions (Signed)
Tina Phelps , Thank you for taking time to come for your Medicare Wellness Visit. I appreciate your ongoing commitment to your health goals. Please review the following plan we discussed and let me know if I can assist you in the future.   These are the goals we discussed:  Goals      Blood Pressure < 140/90     Exercise 150 minutes per week (moderate activity)     HEMOGLOBIN A1C < 7.5     Patient Stated     10/01/2022, no goals        This is a list of the screening recommended for you and due dates:  Health Maintenance  Topic Date Due   DTaP/Tdap/Td vaccine (1 - Tdap) Never done   Mammogram  Never done   Zoster (Shingles) Vaccine (1 of 2) Never done   DEXA scan (bone density measurement)  Never done   Complete foot exam   04/15/2019   COVID-19 Vaccine (4 - 2023-24 season) 03/01/2022   Eye exam for diabetics  06/04/2022   Colon Cancer Screening  11/16/2026*   Yearly kidney health urinalysis for diabetes  12/28/2022   Flu Shot  01/30/2023   Hemoglobin A1C  02/04/2023   Yearly kidney function blood test for diabetes  09/11/2023   Medicare Annual Wellness Visit  10/01/2023   Pneumonia Vaccine  Completed   Hepatitis C Screening: USPSTF Recommendation to screen - Ages 67-79 yo.  Completed   HPV Vaccine  Aged Out  *Topic was postponed. The date shown is not the original due date.    Advanced directives: Advance directive discussed with you today.   Conditions/risks identified: none  Next appointment: Follow up in one year for your annual wellness visit    Preventive Care 65 Years and Older, Female Preventive care refers to lifestyle choices and visits with your health care provider that can promote health and wellness. What does preventive care include? A yearly physical exam. This is also called an annual well check. Dental exams once or twice a year. Routine eye exams. Ask your health care provider how often you should have your eyes checked. Personal lifestyle choices,  including: Daily care of your teeth and gums. Regular physical activity. Eating a healthy diet. Avoiding tobacco and drug use. Limiting alcohol use. Practicing safe sex. Taking low-dose aspirin every day. Taking vitamin and mineral supplements as recommended by your health care provider. What happens during an annual well check? The services and screenings done by your health care provider during your annual well check will depend on your age, overall health, lifestyle risk factors, and family history of disease. Counseling  Your health care provider may ask you questions about your: Alcohol use. Tobacco use. Drug use. Emotional well-being. Home and relationship well-being. Sexual activity. Eating habits. History of falls. Memory and ability to understand (cognition). Work and work Statistician. Reproductive health. Screening  You may have the following tests or measurements: Height, weight, and BMI. Blood pressure. Lipid and cholesterol levels. These may be checked every 5 years, or more frequently if you are over 4 years old. Skin check. Lung cancer screening. You may have this screening every year starting at age 41 if you have a 30-pack-year history of smoking and currently smoke or have quit within the past 15 years. Fecal occult blood test (FOBT) of the stool. You may have this test every year starting at age 32. Flexible sigmoidoscopy or colonoscopy. You may have a sigmoidoscopy every 5 years or a  colonoscopy every 10 years starting at age 52. Hepatitis C blood test. Hepatitis B blood test. Sexually transmitted disease (STD) testing. Diabetes screening. This is done by checking your blood sugar (glucose) after you have not eaten for a while (fasting). You may have this done every 1-3 years. Bone density scan. This is done to screen for osteoporosis. You may have this done starting at age 12. Mammogram. This may be done every 1-2 years. Talk to your health care provider  about how often you should have regular mammograms. Talk with your health care provider about your test results, treatment options, and if necessary, the need for more tests. Vaccines  Your health care provider may recommend certain vaccines, such as: Influenza vaccine. This is recommended every year. Tetanus, diphtheria, and acellular pertussis (Tdap, Td) vaccine. You may need a Td booster every 10 years. Zoster vaccine. You may need this after age 31. Pneumococcal 13-valent conjugate (PCV13) vaccine. One dose is recommended after age 40. Pneumococcal polysaccharide (PPSV23) vaccine. One dose is recommended after age 1. Talk to your health care provider about which screenings and vaccines you need and how often you need them. This information is not intended to replace advice given to you by your health care provider. Make sure you discuss any questions you have with your health care provider. Document Released: 07/14/2015 Document Revised: 03/06/2016 Document Reviewed: 04/18/2015 Elsevier Interactive Patient Education  2017 [employer] Prevention in the Home Falls can cause injuries. They can happen to people of all ages. There are many things you can do to make your home safe and to help prevent falls. What can I do on the outside of my home? Regularly fix the edges of walkways and driveways and fix any cracks. Remove anything that might make you trip as you walk through a door, such as a raised step or threshold. Trim any bushes or trees on the path to your home. Use bright outdoor lighting. Clear any walking paths of anything that might make someone trip, such as rocks or tools. Regularly check to see if handrails are loose or broken. Make sure that both sides of any steps have handrails. Any raised decks and porches should have guardrails on the edges. Have any leaves, snow, or ice cleared regularly. Use sand or salt on walking paths during winter. Clean up any spills in  your garage right away. This includes oil or grease spills. What can I do in the bathroom? Use night lights. Install grab bars by the toilet and in the tub and shower. Do not use towel bars as grab bars. Use non-skid mats or decals in the tub or shower. If you need to sit down in the shower, use a plastic, non-slip stool. Keep the floor dry. Clean up any water that spills on the floor as soon as it happens. Remove soap buildup in the tub or shower regularly. Attach bath mats securely with double-sided non-slip rug tape. Do not have throw rugs and other things on the floor that can make you trip. What can I do in the bedroom? Use night lights. Make sure that you have a light by your bed that is easy to reach. Do not use any sheets or blankets that are too big for your bed. They should not hang down onto the floor. Have a firm chair that has side arms. You can use this for support while you get dressed. Do not have throw rugs and other things on the floor that can  make you trip. What can I do in the kitchen? Clean up any spills right away. Avoid walking on wet floors. Keep items that you use a lot in easy-to-reach places. If you need to reach something above you, use a strong step stool that has a grab bar. Keep electrical cords out of the way. Do not use floor polish or wax that makes floors slippery. If you must use wax, use non-skid floor wax. Do not have throw rugs and other things on the floor that can make you trip. What can I do with my stairs? Do not leave any items on the stairs. Make sure that there are handrails on both sides of the stairs and use them. Fix handrails that are broken or loose. Make sure that handrails are as long as the stairways. Check any carpeting to make sure that it is firmly attached to the stairs. Fix any carpet that is loose or worn. Avoid having throw rugs at the top or bottom of the stairs. If you do have throw rugs, attach them to the floor with carpet  tape. Make sure that you have a light switch at the top of the stairs and the bottom of the stairs. If you do not have them, ask someone to add them for you. What else can I do to help prevent falls? Wear shoes that: Do not have high heels. Have rubber bottoms. Are comfortable and fit you well. Are closed at the toe. Do not wear sandals. If you use a stepladder: Make sure that it is fully opened. Do not climb a closed stepladder. Make sure that both sides of the stepladder are locked into place. Ask someone to hold it for you, if possible. Clearly mark and make sure that you can see: Any grab bars or handrails. First and last steps. Where the edge of each step is. Use tools that help you move around (mobility aids) if they are needed. These include: Canes. Walkers. Scooters. Crutches. Turn on the lights when you go into a dark area. Replace any light bulbs as soon as they burn out. Set up your furniture so you have a clear path. Avoid moving your furniture around. If any of your floors are uneven, fix them. If there are any pets around you, be aware of where they are. Review your medicines with your doctor. Some medicines can make you feel dizzy. This can increase your chance of falling. Ask your doctor what other things that you can do to help prevent falls. This information is not intended to replace advice given to you by your health care provider. Make sure you discuss any questions you have with your health care provider. Document Released: 04/13/2009 Document Revised: 11/23/2015 Document Reviewed: 07/22/2014 Elsevier Interactive Patient Education  2017 [employer].

## 2022-10-29 ENCOUNTER — Encounter: Payer: Self-pay | Admitting: Certified Registered Nurse Anesthetist

## 2022-11-05 ENCOUNTER — Ambulatory Visit (AMBULATORY_SURGERY_CENTER): Payer: Medicare Other | Admitting: Gastroenterology

## 2022-11-05 ENCOUNTER — Encounter: Payer: Self-pay | Admitting: Gastroenterology

## 2022-11-05 VITALS — BP 128/92 | HR 78 | Temp 98.6°F | Resp 12 | Ht 63.0 in | Wt 152.0 lb

## 2022-11-05 DIAGNOSIS — K2970 Gastritis, unspecified, without bleeding: Secondary | ICD-10-CM

## 2022-11-05 DIAGNOSIS — K2950 Unspecified chronic gastritis without bleeding: Secondary | ICD-10-CM | POA: Diagnosis not present

## 2022-11-05 DIAGNOSIS — D124 Benign neoplasm of descending colon: Secondary | ICD-10-CM | POA: Diagnosis not present

## 2022-11-05 DIAGNOSIS — K635 Polyp of colon: Secondary | ICD-10-CM | POA: Diagnosis not present

## 2022-11-05 DIAGNOSIS — E119 Type 2 diabetes mellitus without complications: Secondary | ICD-10-CM | POA: Diagnosis not present

## 2022-11-05 DIAGNOSIS — K641 Second degree hemorrhoids: Secondary | ICD-10-CM | POA: Diagnosis not present

## 2022-11-05 DIAGNOSIS — D125 Benign neoplasm of sigmoid colon: Secondary | ICD-10-CM

## 2022-11-05 DIAGNOSIS — Z1211 Encounter for screening for malignant neoplasm of colon: Secondary | ICD-10-CM

## 2022-11-05 DIAGNOSIS — K219 Gastro-esophageal reflux disease without esophagitis: Secondary | ICD-10-CM | POA: Diagnosis not present

## 2022-11-05 DIAGNOSIS — K2980 Duodenitis without bleeding: Secondary | ICD-10-CM | POA: Diagnosis not present

## 2022-11-05 DIAGNOSIS — D649 Anemia, unspecified: Secondary | ICD-10-CM | POA: Diagnosis not present

## 2022-11-05 DIAGNOSIS — I10 Essential (primary) hypertension: Secondary | ICD-10-CM | POA: Diagnosis not present

## 2022-11-05 DIAGNOSIS — D128 Benign neoplasm of rectum: Secondary | ICD-10-CM | POA: Diagnosis not present

## 2022-11-05 MED ORDER — SODIUM CHLORIDE 0.9 % IV SOLN: 500.0000 mL | Freq: Once | INTRAVENOUS | Status: DC

## 2022-11-05 NOTE — Op Note (Signed)
[facility] Patient Name: Tina Phelps Procedure Date: 11/05/2022 2:15 PM MRN: [id number] Endoscopist: Doristine Locks , MD, [id number] Age: 77 Referring MD:  Date of Birth: 1945-07-24 Gender: Female Account #: [id number] Procedure:                Colonoscopy Indications:              Screening for colorectal malignant neoplasm, This                            is the patient's first colonoscopy Medicines:                Monitored Anesthesia Care Procedure:                Pre-Anesthesia Assessment:                           - Prior to the procedure, a History and Physical                            was performed, and patient medications and                            allergies were reviewed. The patient's tolerance of                            previous anesthesia was also reviewed. The risks                            and benefits of the procedure and the sedation                            options and risks were discussed with the patient                            ad her daughter at bedside. All questions were                            answered, and informed consent was obtained. Prior                            Anticoagulants: The patient has taken no                            anticoagulant or antiplatelet agents. ASA Grade                            Assessment: II - A patient with mild systemic                            disease. After reviewing the risks and benefits,                            the patient was deemed in satisfactory condition to  undergo the procedure.                           After obtaining informed consent, the colonoscope                            was passed under direct vision. Throughout the                            procedure, the patient's blood pressure, pulse, and                            oxygen saturations were monitored continuously. The                            Olympus CF-HQ190L ([id number]) Colonoscope was                             introduced through the anus and advanced to the the                            terminal ileum. The colonoscopy was technically                            difficult and complex due to significant looping.                            The patient tolerated the procedure well. The                            quality of the bowel preparation was good. The                            terminal ileum, ileocecal valve, appendiceal                            orifice, and rectum were photographed. Scope In: 2:45:20 PM Scope Out: 3:12:27 PM Scope Withdrawal Time: 0 hours 18 minutes 31 seconds  Total Procedure Duration: 0 hours 27 minutes 7 seconds  Findings:                 Skin tags were found on perianal exam.                           Five sessile polyps were found in the sigmoid colon                            (4) and descending colon (1). The polyps were 2 to                            4 mm in size. These polyps were removed with a cold                            snare. Resection and retrieval were complete.  Estimated blood loss was minimal.                           A 6 mm polyp was found in the rectum. The polyp was                            sessile. The polyp was removed with a cold snare.                            Resection and retrieval were complete. Estimated                            blood loss was minimal.                           Non-bleeding internal hemorrhoids were found during                            retroflexion. The hemorrhoids were small.                           The terminal ileum appeared normal. Complications:            No immediate complications. Estimated Blood Loss:     Estimated blood loss was minimal. Impression:               - Perianal skin tags found on perianal exam.                           - Five 2 to 4 mm polyps in the sigmoid colon and in                            the descending colon, removed with a  cold snare.                            Resected and retrieved.                           - One 6 mm polyp in the rectum, removed with a cold                            snare. Resected and retrieved.                           - Non-bleeding internal hemorrhoids.                           - The examined portion of the ileum was normal. Recommendation:           - Patient has a contact number available for                            emergencies. The signs and symptoms of potential  delayed complications were discussed with the                            patient. Return to normal activities tomorrow.                            Written discharge instructions were provided to the                            patient.                           - Resume previous diet.                           - Continue present medications.                           - Await pathology results.                           - Repeat colonoscopy for surveillance based on                            pathology results.                           - Return to GI office PRN. Doristine Locks, MD 11/05/2022 3:24:20 PM

## 2022-11-05 NOTE — Progress Notes (Signed)
Called to room to assist during endoscopic procedure.  Patient ID and intended procedure confirmed with present staff. Received instructions for my participation in the procedure from the performing physician.  

## 2022-11-05 NOTE — Progress Notes (Signed)
GASTROENTEROLOGY PROCEDURE H&P NOTE   Primary Care Physician: Hoy Register, MD    Reason for Procedure:   GERD, duodenitis, CRC screening, MEG pain, anemia  Plan:    EGD, colonoscopy  Patient is appropriate for endoscopic procedure(s) in the ambulatory ([facility]) setting.  The nature of the procedure, as well as the risks, benefits, and alternatives were carefully and thoroughly reviewed with the patient. Ample time for discussion and questions allowed. The patient understood, was satisfied, and agreed to proceed.     HPI: Tina Phelps is a 77 y.o. female who presents for EGD and colonoscopy for evaluation of GERD, duodenitis on recent CT, anemia, epigastric pain, and colon cancer screening.  Past Medical History:  Diagnosis Date   Arthritis    Diabetes mellitus without complication (HCC)    Glaucoma    Hypertension     Past Surgical History:  Procedure Laterality Date   CHOLECYSTECTOMY     NEPHRECTOMY Right    OOPHORECTOMY Right     Prior to Admission medications   Medication Sig Start Date End Date Taking? Authorizing Provider  ACCU-CHEK FASTCLIX LANCETS MISC Use as directed 05/27/17  Yes Arrie Senate R, FNP  Accu-Chek Softclix Lancets lancets Use as instructed tid before meals 09/11/22  Yes Newlin, Enobong, MD  Bismuth 262 MG CHEW Chew 524 mg by mouth in the morning, at noon, in the evening, and at bedtime. 09/19/22  Yes Quentin Mulling R, PA-C  Blood Glucose Monitoring Suppl (ACCU-CHEK GUIDE) w/Device KIT Use as directed tid 09/11/22  Yes Hoy Register, MD  brimonidine (ALPHAGAN) 0.2 % ophthalmic solution Instill 1 drop into both eyes twice a day (Will send in additional refills after patient's yearly exam) 07/19/22  Yes   chlorthalidone (HYGROTON) 25 MG tablet Take 1 tablet (25 mg total) by mouth daily. 05/22/22  Yes Hoy Register, MD  ciclopirox (PENLAC) 8 % solution Apply topically at bedtime. Apply over nail and surrounding skin. Apply daily over previous  coat. After seven (7) days, may remove with alcohol and continue cycle. 12/21/19  Yes Hoy Register, MD  Continuous Blood Gluc Transmit (DEXCOM G6 TRANSMITTER) MISC Use to check blood sugar three times daily. Change transmitter once every 90 days. 12/27/21  Yes Hoy Register, MD  diclofenac sodium (VOLTAREN) 1 % GEL Apply 2 g topically 4 (four) times daily. Patient taking differently: Apply 2 g topically daily as needed (for shoulder pain). 04/14/18  Yes Hoy Register, MD  dorzolamide-timolol (COSOPT) 2-0.5 % ophthalmic solution Instill 1 drop into both eyes twice a day (Will send in additional refills after patient's yearly exam) 07/19/22  Yes   doxycycline (VIBRAMYCIN) 100 MG capsule Take 1 capsule (100 mg total) by mouth 2 (two) times daily. Patient not taking: Reported on 10/01/2022 09/19/22   Doree Albee, PA-C  gabapentin (NEURONTIN) 300 MG capsule TAKE 1 CAPSULE (300 MG TOTAL) BY MOUTH AT BEDTIME. Patient taking differently: Take 300 mg by mouth at bedtime. 12/27/21 12/27/22 Yes Newlin, Enobong, MD  glucose blood (ACCU-CHEK GUIDE) test strip USE AS DIRECTED : TEST BLOOD SUGARS THREE TIMES A DAY 09/11/22  Yes Newlin, Odette Horns, MD  Insulin Glargine (BASAGLAR KWIKPEN) 100 UNIT/ML Inject 32 Units into the skin daily. 09/11/22  Yes Hoy Register, MD  Insulin Pen Needle (B-D UF III MINI PEN NEEDLES) 31G X 5 MM MISC USE AS DIRECTED 03/01/21  Yes McClung, Angela M, PA-C  latanoprost (XALATAN) 0.005 % ophthalmic solution Instill 1 drop into both eyes every evening 06/04/21  Yes  lisinopril (ZESTRIL) 20 MG tablet TAKE 1 TABLET (20 MG TOTAL) BY MOUTH DAILY. 09/11/22 09/11/23 Yes Newlin, Odette Horns, MD  metFORMIN (GLUCOPHAGE-XR) 500 MG 24 hr tablet TAKE 2 TABLETS (1,000 MG TOTAL) BY MOUTH DAILY WITH BREAKFAST. 09/11/22 09/11/23 Yes Hoy Register, MD  metroNIDAZOLE (FLAGYL) 500 MG tablet Take 1 tablet (500 mg total) by mouth 2 (two) times daily. Patient not taking: Reported on 10/01/2022 09/19/22   Doree Albee, PA-C  MURO 128 5 % ophthalmic ointment Place 1 Application into both eyes every evening. 11/19/19  Yes [provider]  pantoprazole (PROTONIX) 40 MG tablet Take 1 tablet (40 mg total) by mouth 2 (two) times daily. 09/19/22  Yes Quentin Mulling R, PA-C  rosuvastatin (CRESTOR) 20 MG tablet Take 1 tablet (20 mg total) by mouth daily. 09/12/22  Yes Hoy Register, MD  Continuous Blood Gluc Receiver (DEXCOM G6 RECEIVER) DEVI Use to check blood sugar three times daily. 12/27/21   Hoy Register, MD  Continuous Blood Gluc Sensor (DEXCOM G6 SENSOR) MISC Use to check blood sugar three times daily. Change sensor once every 10 days. 12/27/21   Hoy Register, MD  ondansetron (ZOFRAN-ODT) 8 MG disintegrating tablet Place 1 tablet twice a day by translingual route as needed for 3 days. Patient not taking: Reported on 10/01/2022 11/03/21     pantoprazole (PROTONIX) 40 MG tablet Take 1 tablet (40 mg total) by mouth daily. 08/08/22 11/06/22  Jerald Kief, MD  sucralfate (CARAFATE) 1 g tablet Take 1 tablet (1 g total) by mouth 4 (four) times daily for 28 days. 08/08/22 09/05/22  Jerald Kief, MD    Current Outpatient Medications  Medication Sig Dispense Refill   ACCU-CHEK FASTCLIX LANCETS MISC Use as directed 100 each 3   Accu-Chek Softclix Lancets lancets Use as instructed tid before meals 100 each 12   Bismuth 262 MG CHEW Chew 524 mg by mouth in the morning, at noon, in the evening, and at bedtime. 112 tablet 0   Blood Glucose Monitoring Suppl (ACCU-CHEK GUIDE) w/Device KIT Use as directed tid 1 kit 0   brimonidine (ALPHAGAN) 0.2 % ophthalmic solution Instill 1 drop into both eyes twice a day (Will send in additional refills after patient's yearly exam) 5 mL 0   chlorthalidone (HYGROTON) 25 MG tablet Take 1 tablet (25 mg total) by mouth daily. 90 tablet 1   ciclopirox (PENLAC) 8 % solution Apply topically at bedtime. Apply over nail and surrounding skin. Apply daily over previous coat. After seven (7)  days, may remove with alcohol and continue cycle. 6.6 mL 0   Continuous Blood Gluc Transmit (DEXCOM G6 TRANSMITTER) MISC Use to check blood sugar three times daily. Change transmitter once every 90 days. 1 each 1   diclofenac sodium (VOLTAREN) 1 % GEL Apply 2 g topically 4 (four) times daily. (Patient taking differently: Apply 2 g topically daily as needed (for shoulder pain).) 300 g 3   dorzolamide-timolol (COSOPT) 2-0.5 % ophthalmic solution Instill 1 drop into both eyes twice a day (Will send in additional refills after patient's yearly exam) 10 mL 0   doxycycline (VIBRAMYCIN) 100 MG capsule Take 1 capsule (100 mg total) by mouth 2 (two) times daily. (Patient not taking: Reported on 10/01/2022) 28 capsule 0   gabapentin (NEURONTIN) 300 MG capsule TAKE 1 CAPSULE (300 MG TOTAL) BY MOUTH AT BEDTIME. (Patient taking differently: Take 300 mg by mouth at bedtime.) 90 capsule 1   glucose blood (ACCU-CHEK GUIDE) test strip USE AS DIRECTED :  TEST BLOOD SUGARS THREE TIMES A DAY 100 each 12   Insulin Glargine (BASAGLAR KWIKPEN) 100 UNIT/ML Inject 32 Units into the skin daily. 30 mL 6   Insulin Pen Needle (B-D UF III MINI PEN NEEDLES) 31G X 5 MM MISC USE AS DIRECTED 100 each 1   latanoprost (XALATAN) 0.005 % ophthalmic solution Instill 1 drop into both eyes every evening 7.5 mL 12   lisinopril (ZESTRIL) 20 MG tablet TAKE 1 TABLET (20 MG TOTAL) BY MOUTH DAILY. 90 tablet 1   metFORMIN (GLUCOPHAGE-XR) 500 MG 24 hr tablet TAKE 2 TABLETS (1,000 MG TOTAL) BY MOUTH DAILY WITH BREAKFAST. 180 tablet 1   metroNIDAZOLE (FLAGYL) 500 MG tablet Take 1 tablet (500 mg total) by mouth 2 (two) times daily. (Patient not taking: Reported on 10/01/2022) 20 tablet 0   MURO 128 5 % ophthalmic ointment Place 1 Application into both eyes every evening.     pantoprazole (PROTONIX) 40 MG tablet Take 1 tablet (40 mg total) by mouth 2 (two) times daily. 28 tablet 0   rosuvastatin (CRESTOR) 20 MG tablet Take 1 tablet (20 mg total) by mouth  daily. 90 tablet 1   Continuous Blood Gluc Receiver (DEXCOM G6 RECEIVER) DEVI Use to check blood sugar three times daily. 1 each 0   Continuous Blood Gluc Sensor (DEXCOM G6 SENSOR) MISC Use to check blood sugar three times daily. Change sensor once every 10 days. 3 each 2   ondansetron (ZOFRAN-ODT) 8 MG disintegrating tablet Place 1 tablet twice a day by translingual route as needed for 3 days. (Patient not taking: Reported on 10/01/2022) 6 tablet 0   pantoprazole (PROTONIX) 40 MG tablet Take 1 tablet (40 mg total) by mouth daily. 90 tablet 0   sucralfate (CARAFATE) 1 g tablet Take 1 tablet (1 g total) by mouth 4 (four) times daily for 28 days. 112 tablet 0   Current Facility-Administered Medications  Medication Dose Route Frequency Provider Last Rate Last Admin   0.9 %  sodium chloride infusion  500 mL Intravenous Once Ronrico Dupin V, DO        Allergies as of 11/05/2022 - Review Complete 11/05/2022  Allergen Reaction Noted   Pork-derived products  08/06/2022    Family History  Problem Relation Age of Onset   Diabetes Sister    Diabetes Brother    Diabetes Daughter     Social History   Socioeconomic History   Marital status: Single    Spouse name: Not on file   Number of children: Not on file   Years of education: Not on file   Highest education level: Not on file  Occupational History   Not on file  Tobacco Use   Smoking status: Never   Smokeless tobacco: Never  Vaping Use   Vaping Use: Never used  Substance and Sexual Activity   Alcohol use: No   Drug use: No   Sexual activity: Not Currently  Other Topics Concern   Not on file  Social History Narrative   Not on file   Social Determinants of Health   Financial Resource Strain: Low Risk  (10/01/2022)   Overall Financial Resource Strain (CARDIA)    Difficulty of Paying Living Expenses: Not hard at all  Food Insecurity: No Food Insecurity (10/01/2022)   Hunger Vital Sign    Worried About Running Out of Food in the  Last Year: Never true    Ran Out of Food in the Last Year: Never true  Transportation Needs: No Transportation  Needs (10/01/2022)   PRAPARE - Administrator, Civil Service (Medical): No    Lack of Transportation (Non-Medical): No  Physical Activity: Insufficiently Active (10/01/2022)   Exercise Vital Sign    Days of Exercise per Week: 5 days    Minutes of Exercise per Session: 20 min  Stress: No Stress Concern Present (10/01/2022)   [employer] of Occupational Health - Occupational Stress Questionnaire    Feeling of Stress : Not at all  Social Connections: Not on file  Intimate Partner Violence: Not At Risk (08/06/2022)   Humiliation, Afraid, Rape, and Kick questionnaire    Fear of Current or Ex-Partner: No    Emotionally Abused: No    Physically Abused: No    Sexually Abused: No    Physical Exam: Vital signs in last 24 hours: @BP  (!) 201/87   Pulse 91   Temp 98.6 F (37 C) (Temporal)   Resp 17   Ht 5\' 3"  (1.6 m)   Wt 152 lb (68.9 kg)   SpO2 100%   BMI 26.93 kg/m  GEN: NAD EYE: Sclerae anicteric ENT: MMM CV: Non-tachycardic Pulm: CTA b/l GI: Soft, NT/ND NEURO:  Alert & Oriented x 3   Doristine Locks, DO [facility]   11/05/2022 2:24 PM

## 2022-11-05 NOTE — Op Note (Signed)
[facility] Patient Name: Tina Phelps Procedure Date: 11/05/2022 2:16 PM MRN: [id number] Endoscopist: Doristine Locks , MD, [id number] Age: 77 Referring MD:  Date of Birth: [birth year] Gender: Female Account #: [id number] Procedure:                Upper GI endoscopy Indications:              Epigastric abdominal pain, Heartburn, Suspected                            esophageal reflux, Abnormal CT of the GI tract Medicines:                Monitored Anesthesia Care Procedure:                Pre-Anesthesia Assessment:                           - Prior to the procedure, a History and Physical                            was performed, and patient medications and                            allergies were reviewed. The patient's tolerance of                            previous anesthesia was also reviewed. The risks                            and benefits of the procedure and the sedation                            options and risks were discussed with the patient                            and her daughter at bedside. All questions were                            answered, and informed consent was obtained. Prior                            Anticoagulants: The patient has taken no                            anticoagulant or antiplatelet agents. ASA Grade                            Assessment: II - A patient with mild systemic                            disease. After reviewing the risks and benefits,                            the patient was deemed in satisfactory condition to  undergo the procedure.                           After obtaining informed consent, the endoscope was                            passed under direct vision. Throughout the                            procedure, the patient's blood pressure, pulse, and                            oxygen saturations were monitored continuously. The                            Olympus scope [id number] was  introduced through the                            mouth, and advanced to the second part of duodenum.                            The upper GI endoscopy was accomplished without                            difficulty. The patient tolerated the procedure                            well. Scope In: Scope Out: Findings:                 The examined esophagus was normal.                           The Z-line was regular and was found 36 cm from the                            incisors.                           The entire examined stomach was normal. Biopsies                            were taken with a cold forceps for Helicobacter                            pylori testing. Estimated blood loss was minimal.                           There was a faint scar in the 2nd portion of the                            duodenum, possibly a healing site from the the                            suspected ulcer seen on the recetn  CT. Otherwise,                            the remainder of the duodenum was normal. Biopsies                            were taken with a cold forceps for histology.                            Estimated blood loss was minimal. Complications:            No immediate complications. Estimated Blood Loss:     Estimated blood loss was minimal. Impression:               - Normal esophagus.                           - Z-line regular, 36 cm from the incisors.                           - Normal stomach. Biopsied.                           - Normal examined duodenum. Biopsied. Recommendation:           - Patient has a contact number available for                            emergencies. The signs and symptoms of potential                            delayed complications were discussed with the                            patient. Return to normal activities tomorrow.                            Written discharge instructions were provided to the                            patient.                            - Resume previous diet.                           - Continue present medications.                           - Await pathology results.                           - Colonoscopy today. Doristine Locks, MD 11/05/2022 3:20:24 PM

## 2022-11-05 NOTE — Patient Instructions (Addendum)
Recommendation: Patient has a contact number available for                            emergencies. The signs and symptoms of potential                            delayed complications were discussed with the                            patient. Return to normal activities tomorrow.                            Written discharge instructions were provided to the                            patient.                           - Resume previous diet.                           - Continue present medications.                           - Await pathology results.                           - Repeat colonoscopy for surveillance based on                            pathology results.                           - Return to GI office PRN.  Handouts on hemorrhoids and polyps given.   YOU HAD AN ENDOSCOPIC PROCEDURE TODAY AT THE [facility]:   Refer to the procedure report that was given to you for any specific questions about what was found during the examination.  If the procedure report does not answer your questions, please call your gastroenterologist to clarify.  If you requested that your care partner not be given the details of your procedure findings, then the procedure report has been included in a sealed envelope for you to review at your convenience later.  YOU SHOULD EXPECT: Some feelings of bloating in the abdomen. Passage of more gas than usual.  Walking can help get rid of the air that was put into your GI tract during the procedure and reduce the bloating. If you had a lower endoscopy (such as a colonoscopy or flexible sigmoidoscopy) you may notice spotting of blood in your stool or on the toilet paper. If you underwent a bowel prep for your procedure, you may not have a normal bowel movement for a few days.  Please Note:  You might notice some irritation and congestion in your nose or some drainage.  This is from the oxygen used during your procedure.  There is no need for concern and it  should clear up in a day or so.  SYMPTOMS TO REPORT IMMEDIATELY:  Following lower endoscopy (colonoscopy or flexible sigmoidoscopy):  Excessive amounts of blood  in the stool  Significant tenderness or worsening of abdominal pains  Swelling of the abdomen that is new, acute  Fever of 100F or higher  Following upper endoscopy (EGD)  Vomiting of blood or coffee ground material  New chest pain or pain under the shoulder blades  Painful or persistently difficult swallowing  New shortness of breath  Fever of 100F or higher  Black, tarry-looking stools  For urgent or emergent issues, a gastroenterologist can be reached at any hour by calling [phone]. Do not use MyChart messaging for urgent concerns.    DIET:  We do recommend a small meal at first, but then you may proceed to your regular diet.  Drink plenty of fluids but you should avoid alcoholic beverages for 24 hours.  ACTIVITY:  You should plan to take it easy for the rest of today and you should NOT DRIVE or use heavy machinery until tomorrow (because of the sedation medicines used during the test).    FOLLOW UP: Our staff will call the number listed on your records the next business day following your procedure.  We will call around 7:15- 8:00 am to check on you and address any questions or concerns that you may have regarding the information given to you following your procedure. If we do not reach you, we will leave a message.     If any biopsies were taken you will be contacted by phone or by letter within the next 1-3 weeks.  Please call us at [phone] if you have not heard about the biopsies in 3 weeks.    SIGNATURES/CONFIDENTIALITY: You and/or your care partner have signed paperwork which will be entered into your electronic medical record.  These signatures attest to the fact that that the information above on your After Visit Summary has been reviewed and is understood.  Full responsibility of the  confidentiality of this discharge information lies with you and/or your care-partner.

## 2022-11-05 NOTE — Progress Notes (Signed)
1418 Robinul 0.1 mg IV given due large amount of secretions upon assessment.  MD made aware, vss  °

## 2022-11-05 NOTE — Progress Notes (Signed)
Report given to PACU, vss 

## 2022-11-05 NOTE — Progress Notes (Signed)
Vitals-KW  Pt's states no medical or surgical changes since previsit or office visit. 

## 2022-11-06 ENCOUNTER — Telehealth: Payer: Self-pay

## 2022-11-06 NOTE — Telephone Encounter (Signed)
  Follow up Call-     11/05/2022    1:19 PM  Call back number  Post procedure Call Back phone  # [phone]  [email or web address]  Permission to leave phone message Yes     Patient questions:  Do you have a fever, pain , or abdominal swelling? No. Pain Score  0 *  Have you tolerated food without any problems? Yes.    Have you been able to return to your normal activities? Yes.    Do you have any questions about your discharge instructions: Diet   No. Medications  No. Follow up visit  No.  Do you have questions or concerns about your Care? No.  Actions: * If pain score is 4 or above: No action needed, pain <4.

## 2022-11-07 ENCOUNTER — Other Ambulatory Visit: Payer: Self-pay

## 2022-11-07 ENCOUNTER — Other Ambulatory Visit: Payer: Self-pay | Admitting: Family Medicine

## 2022-11-07 DIAGNOSIS — E1149 Type 2 diabetes mellitus with other diabetic neurological complication: Secondary | ICD-10-CM

## 2022-11-07 DIAGNOSIS — E1169 Type 2 diabetes mellitus with other specified complication: Secondary | ICD-10-CM

## 2022-11-07 MED ORDER — GABAPENTIN 300 MG PO CAPS
300.0000 mg | ORAL_CAPSULE | Freq: Every day | ORAL | 0 refills | Status: DC
Start: 2022-11-07 — End: 2022-12-12
  Filled 2022-11-07: qty 90, 90d supply, fill #0

## 2022-11-07 NOTE — Telephone Encounter (Signed)
Requested Prescriptions  Pending Prescriptions Disp Refills   gabapentin (NEURONTIN) 300 MG capsule 90 capsule 0    Sig: TAKE 1 CAPSULE (300 MG TOTAL) BY MOUTH AT BEDTIME.     Neurology: Anticonvulsants - gabapentin Failed - 11/07/2022 11:55 AM      Failed - Cr in normal range and within 360 days    Creat  Date Value Ref Range Status  09/30/2014 0.81 0.50 - 1.10 mg/dL Final   Creatinine, Ser  Date Value Ref Range Status  09/11/2022 1.09 (H) 0.57 - 1.00 mg/dL Final   Creatinine, Urine  Date Value Ref Range Status  05/02/2015 48 20 - 320 mg/dL Final    Comment:    ** Please note change in reference range(s). **            Passed - Completed PHQ-2 or PHQ-9 in the last 360 days      Passed - Valid encounter within last 12 months    Recent Outpatient Visits           1 month ago Duodenitis   [place] [facility] [place], [place], MD   5 months ago Type 2 diabetes mellitus with other specified complication, with long-term current use of insulin [facility])   [facility] Hoy Register, MD   8 months ago Onychomycosis   [place] [facility] [place], Odette Horns, MD   10 months ago Type 2 diabetes mellitus with other specified complication, with long-term current use of insulin [facility])   [place] [facility] Hoy Register, MD   1 year ago Hyperlipidemia associated with type 2 diabetes mellitus [facility])   [place] [facility] Hoy Register, MD       Future Appointments             In 1 month Hoy Register, MD [facility]

## 2022-11-12 ENCOUNTER — Other Ambulatory Visit: Payer: Self-pay

## 2022-11-13 ENCOUNTER — Other Ambulatory Visit: Payer: Self-pay

## 2022-11-13 MED ORDER — DORZOLAMIDE HCL-TIMOLOL MAL 2-0.5 % OP SOLN
1.0000 [drp] | Freq: Two times a day (BID) | OPHTHALMIC | 0 refills | Status: DC
  Filled 2022-11-13: qty 10, 50d supply, fill #0

## 2022-11-13 MED ORDER — LATANOPROST 0.005 % OP SOLN
1.0000 [drp] | Freq: Every evening | OPHTHALMIC | 0 refills | Status: DC
  Filled 2022-11-13: qty 2.5, 25d supply, fill #0

## 2022-11-13 MED ORDER — BRIMONIDINE TARTRATE 0.2 % OP SOLN
1.0000 [drp] | Freq: Two times a day (BID) | OPHTHALMIC | 0 refills | Status: DC
  Filled 2022-11-13: qty 5, 25d supply, fill #0

## 2022-11-14 ENCOUNTER — Other Ambulatory Visit: Payer: Self-pay

## 2022-12-11 ENCOUNTER — Other Ambulatory Visit: Payer: Self-pay

## 2022-12-11 ENCOUNTER — Other Ambulatory Visit: Payer: Self-pay | Admitting: Pharmacist

## 2022-12-11 DIAGNOSIS — E1165 Type 2 diabetes mellitus with hyperglycemia: Secondary | ICD-10-CM

## 2022-12-11 MED ORDER — DEXCOM G6 TRANSMITTER MISC
1 refills | Status: DC
Start: 2022-12-11 — End: 2023-11-08

## 2022-12-11 MED ORDER — DEXCOM G6 SENSOR MISC
2 refills | Status: DC
Start: 2022-12-11 — End: 2023-03-18

## 2022-12-11 NOTE — Progress Notes (Signed)
   Byanca Kretz 01/14/1946 [id number]  Patient outreached by Alesia Banda , PharmD Candidate on 12/11/2022.  Blood Pressure Readings: Last documented ambulatory systolic blood pressure: 128 Last documented ambulatory diastolic blood pressure: 92 Does the patient have a validated home blood pressure machine?: No They do not report community readings.  Medication review was performed. Is the patient taking their medications as prescribed?: Yes Differences from their prescribed list include: Patient had concern about not being able to check blood sugars with the sensor because unavailable to provide at [facility]. Would like for prescription for the sensor to be sent to [facility] on [address].  The following barriers to adherence were noted: Does the patient have cost concerns?: No Does the patient have transportation concerns?: No Does the patient need assistance obtaining refills?: Yes Does the patient occassionally forget to take some of their prescribed medications?: No Does the patient feel like one/some of their medications make them feel poorly?: No Does the patient have questions or concerns about their medications?: No Does the patient have a follow up scheduled with their primary care provider/cardiologist?: Yes   Interventions: Interventions Completed: Medications were reviewed, Patient was educated on goal blood pressures and long term health implications of elevated blood pressure, Patient was educated on medications, including indication and administration, Patient was educated on how to access home blood pressure machine, Patient was counseled on lifestyle modifications to improve blood pressure, including  The patient has follow up scheduled:  PCP: Hoy Register, MD   Alesia Banda, Student-PharmD

## 2022-12-12 ENCOUNTER — Telehealth: Payer: Self-pay | Admitting: Family Medicine

## 2022-12-12 ENCOUNTER — Encounter: Payer: Self-pay | Admitting: Family Medicine

## 2022-12-12 ENCOUNTER — Ambulatory Visit: Payer: Medicare Other | Attending: Family Medicine | Admitting: Family Medicine

## 2022-12-12 ENCOUNTER — Other Ambulatory Visit: Payer: Self-pay

## 2022-12-12 VITALS — BP 156/78 | HR 66 | Temp 98.1°F | Ht 63.0 in | Wt 153.4 lb

## 2022-12-12 DIAGNOSIS — K219 Gastro-esophageal reflux disease without esophagitis: Secondary | ICD-10-CM | POA: Diagnosis not present

## 2022-12-12 DIAGNOSIS — E1159 Type 2 diabetes mellitus with other circulatory complications: Secondary | ICD-10-CM | POA: Diagnosis not present

## 2022-12-12 DIAGNOSIS — Z9049 Acquired absence of other specified parts of digestive tract: Secondary | ICD-10-CM | POA: Insufficient documentation

## 2022-12-12 DIAGNOSIS — Z905 Acquired absence of kidney: Secondary | ICD-10-CM | POA: Insufficient documentation

## 2022-12-12 DIAGNOSIS — E1169 Type 2 diabetes mellitus with other specified complication: Secondary | ICD-10-CM

## 2022-12-12 DIAGNOSIS — L84 Corns and callosities: Secondary | ICD-10-CM

## 2022-12-12 DIAGNOSIS — Z794 Long term (current) use of insulin: Secondary | ICD-10-CM

## 2022-12-12 DIAGNOSIS — E1149 Type 2 diabetes mellitus with other diabetic neurological complication: Secondary | ICD-10-CM | POA: Diagnosis not present

## 2022-12-12 DIAGNOSIS — I152 Hypertension secondary to endocrine disorders: Secondary | ICD-10-CM | POA: Diagnosis not present

## 2022-12-12 DIAGNOSIS — E119 Type 2 diabetes mellitus without complications: Secondary | ICD-10-CM | POA: Diagnosis not present

## 2022-12-12 DIAGNOSIS — M7989 Other specified soft tissue disorders: Secondary | ICD-10-CM | POA: Insufficient documentation

## 2022-12-12 DIAGNOSIS — Z79899 Other long term (current) drug therapy: Secondary | ICD-10-CM | POA: Insufficient documentation

## 2022-12-12 DIAGNOSIS — Z7984 Long term (current) use of oral hypoglycemic drugs: Secondary | ICD-10-CM | POA: Insufficient documentation

## 2022-12-12 DIAGNOSIS — E785 Hyperlipidemia, unspecified: Secondary | ICD-10-CM | POA: Insufficient documentation

## 2022-12-12 DIAGNOSIS — I10 Essential (primary) hypertension: Secondary | ICD-10-CM | POA: Insufficient documentation

## 2022-12-12 DIAGNOSIS — R600 Localized edema: Secondary | ICD-10-CM | POA: Diagnosis not present

## 2022-12-12 DIAGNOSIS — M1990 Unspecified osteoarthritis, unspecified site: Secondary | ICD-10-CM | POA: Diagnosis not present

## 2022-12-12 LAB — POCT GLYCOSYLATED HEMOGLOBIN (HGB A1C): HbA1c, POC (controlled diabetic range): 7.9 % — AB (ref 0.0–7.0)

## 2022-12-12 MED ORDER — ROSUVASTATIN CALCIUM 20 MG PO TABS
20.0000 mg | ORAL_TABLET | Freq: Every day | ORAL | 1 refills | Status: DC
Start: 2022-12-12 — End: 2023-03-18
  Filled 2022-12-12: qty 90, 90d supply, fill #0
  Filled 2023-02-28: qty 90, 90d supply, fill #1

## 2022-12-12 MED ORDER — METFORMIN HCL ER 500 MG PO TB24
1000.0000 mg | ORAL_TABLET | Freq: Every day | ORAL | 1 refills | Status: DC
Start: 2022-12-12 — End: 2023-03-18
  Filled 2022-12-12 – 2023-02-28 (×2): qty 180, 90d supply, fill #0

## 2022-12-12 MED ORDER — BASAGLAR KWIKPEN 100 UNIT/ML ~~LOC~~ SOPN
32.0000 [IU] | PEN_INJECTOR | Freq: Every day | SUBCUTANEOUS | 6 refills | Status: DC
Start: 2022-12-12 — End: 2023-03-18
  Filled 2022-12-12 – 2023-02-28 (×2): qty 30, 93d supply, fill #0

## 2022-12-12 MED ORDER — GABAPENTIN 300 MG PO CAPS
300.0000 mg | ORAL_CAPSULE | Freq: Every day | ORAL | 1 refills | Status: DC
Start: 2022-12-12 — End: 2023-03-18
  Filled 2022-12-12 – 2023-02-28 (×2): qty 90, 90d supply, fill #0

## 2022-12-12 MED ORDER — CHLORTHALIDONE 50 MG PO TABS
50.0000 mg | ORAL_TABLET | Freq: Every day | ORAL | 1 refills | Status: DC
Start: 2022-12-12 — End: 2023-03-18
  Filled 2022-12-12: qty 90, 90d supply, fill #0
  Filled 2023-02-28: qty 90, 90d supply, fill #1

## 2022-12-12 MED ORDER — LISINOPRIL 20 MG PO TABS
20.0000 mg | ORAL_TABLET | Freq: Every day | ORAL | 1 refills | Status: DC
Start: 2022-12-12 — End: 2023-03-18
  Filled 2022-12-12 – 2023-02-28 (×2): qty 90, 90d supply, fill #0

## 2022-12-12 NOTE — Progress Notes (Signed)
Subjective:  Patient ID: [name], female    DOB: January 10, 1946  Age: 77 y.o. MRN: [id number]  CC: Diabetes   HPI Tina Phelps is a 77 y.o. year old female with a history of type 2 diabetes mellitus (A1c 7.9), hypertension, hyperlipidemia, osteoarthritis, GERD, s/p R nephrectomy (In [place] secondary to 'kidney cyst') who presents today accompanied by her daughter for a follow-up visit    Interval History:  She has not been checking her BP at home.  BP is elevated today and she endorses taking her antihypertensive today.  Complains of pedal edema present after prolonged sitting. Denies ingestion of high sodium foods. Symptoms  have been present x3 days.  She has no dyspnea, chest pain.  She has not been checking her sugars as the Pharmacy has been giving her the run around and she also. A1c is 7.9 down from 9.5.  She has been administering 30 units of Lantus nightly.  Denies presence of hypoglycemic symptoms.  She remains on gabapentin for neuropathy. Past Medical History:  Diagnosis Date   Arthritis    Diabetes mellitus without complication (HCC)    Glaucoma    Hypertension     Past Surgical History:  Procedure Laterality Date   CHOLECYSTECTOMY     NEPHRECTOMY Right    OOPHORECTOMY Right     Family History  Problem Relation Age of Onset   Diabetes Sister    Diabetes Brother    Diabetes Daughter     Social History   Socioeconomic History   Marital status: Single    Spouse name: Not on file   Number of children: Not on file   Years of education: Not on file   Highest education level: Not on file  Occupational History   Not on file  Tobacco Use   Smoking status: Never   Smokeless tobacco: Never  Vaping Use   Vaping Use: Never used  Substance and Sexual Activity   Alcohol use: No   Drug use: No   Sexual activity: Not Currently  Other Topics Concern   Not on file  Social History Narrative   Not on file   Social Determinants of Health   Financial Resource  Strain: Low Risk  (10/01/2022)   Overall Financial Resource Strain (CARDIA)    Difficulty of Paying Living Expenses: Not hard at all  Food Insecurity: No Food Insecurity (10/01/2022)   Hunger Vital Sign    Worried About Running Out of Food in the Last Year: Never true    Ran Out of Food in the Last Year: Never true  Transportation Needs: No Transportation Needs (10/01/2022)   PRAPARE - Administrator, Civil Service (Medical): No    Lack of Transportation (Non-Medical): No  Physical Activity: Insufficiently Active (10/01/2022)   Exercise Vital Sign    Days of Exercise per Week: 5 days    Minutes of Exercise per Session: 20 min  Stress: No Stress Concern Present (10/01/2022)   [employer] of Occupational Health - Occupational Stress Questionnaire    Feeling of Stress : Not at all  Social Connections: Not on file    Allergies  Allergen Reactions   Pork-Derived Products     Religious preference- non halal    Outpatient Medications Prior to Visit  Medication Sig Dispense Refill   ACCU-CHEK FASTCLIX LANCETS MISC Use as directed 100 each 3   Accu-Chek Softclix Lancets lancets Use as instructed tid before meals 100 each 12   Blood Glucose Monitoring Suppl (ACCU-CHEK  GUIDE) w/Device KIT Use as directed tid 1 kit 0   brimonidine (ALPHAGAN) 0.2 % ophthalmic solution Instill 1 drop into both eyes twice a day 5 mL 0   Continuous Blood Gluc Receiver (DEXCOM G6 RECEIVER) DEVI Use to check blood sugar three times daily. 1 each 0   Continuous Glucose Sensor (DEXCOM G6 SENSOR) MISC Use to check blood sugar three times daily. Change sensor once every 10 days. E11.65 3 each 2   Continuous Glucose Transmitter (DEXCOM G6 TRANSMITTER) MISC Use to check blood sugar three times daily. Change transmitter once every 90 days. E11.65 1 each 1   diclofenac sodium (VOLTAREN) 1 % GEL Apply 2 g topically 4 (four) times daily. (Patient taking differently: Apply 2 g topically daily as needed (for shoulder  pain).) 300 g 3   dorzolamide-timolol (COSOPT) 2-0.5 % ophthalmic solution Instill 1 drop into both eyes twice a day 10 mL 0   doxycycline (VIBRAMYCIN) 100 MG capsule Take 1 capsule (100 mg total) by mouth 2 (two) times daily. 28 capsule 0   glucose blood (ACCU-CHEK GUIDE) test strip USE AS DIRECTED : TEST BLOOD SUGARS THREE TIMES A DAY 100 each 12   Insulin Pen Needle (B-D UF III MINI PEN NEEDLES) 31G X 5 MM MISC USE AS DIRECTED 100 each 1   latanoprost (XALATAN) 0.005 % ophthalmic solution Instill 1 drop into both eyes every evening 7.5 mL 12   latanoprost (XALATAN) 0.005 % ophthalmic solution Place 1 drop into both eyes every evening. 2.5 mL 0   metroNIDAZOLE (FLAGYL) 500 MG tablet Take 1 tablet (500 mg total) by mouth 2 (two) times daily. (Patient not taking: Reported on 12/12/2022) 20 tablet 0   MURO 128 5 % ophthalmic ointment Place 1 Application into both eyes every evening.     ondansetron (ZOFRAN-ODT) 8 MG disintegrating tablet Place 1 tablet twice a day by translingual route as needed for 3 days. 6 tablet 0   pantoprazole (PROTONIX) 40 MG tablet Take 1 tablet (40 mg total) by mouth daily. 90 tablet 0   pantoprazole (PROTONIX) 40 MG tablet Take 1 tablet (40 mg total) by mouth 2 (two) times daily. 28 tablet 0   chlorthalidone (HYGROTON) 25 MG tablet Take 1 tablet (25 mg total) by mouth daily. 90 tablet 1   gabapentin (NEURONTIN) 300 MG capsule Take 1 capsule (300 mg total) by mouth at bedtime. 90 capsule 0   Insulin Glargine (BASAGLAR KWIKPEN) 100 UNIT/ML Inject 32 Units into the skin daily. 30 mL 6   lisinopril (ZESTRIL) 20 MG tablet Take 1 tablet (20 mg total) by mouth daily. 90 tablet 1   metFORMIN (GLUCOPHAGE-XR) 500 MG 24 hr tablet Take 2 tablets (1,000 mg total) by mouth daily with breakfast. 180 tablet 1   rosuvastatin (CRESTOR) 20 MG tablet Take 1 tablet (20 mg total) by mouth daily. 90 tablet 1   Bismuth 262 MG CHEW Chew 524 mg by mouth in the morning, at noon, in the evening, and  at bedtime. (Patient not taking: Reported on 12/12/2022) 112 tablet 0   ciclopirox (PENLAC) 8 % solution Apply topically at bedtime. Apply over nail and surrounding skin. Apply daily over previous coat. After seven (7) days, may remove with alcohol and continue cycle. (Patient not taking: Reported on 12/12/2022) 6.6 mL 0   sucralfate (CARAFATE) 1 g tablet Take 1 tablet (1 g total) by mouth 4 (four) times daily for 28 days. 112 tablet 0   No facility-administered medications prior to visit.  ROS Review of Systems  Constitutional:  Negative for activity change and appetite change.  HENT:  Negative for sinus pressure and sore throat.   Respiratory:  Negative for chest tightness, shortness of breath and wheezing.   Cardiovascular:  Positive for leg swelling. Negative for chest pain and palpitations.  Gastrointestinal:  Negative for abdominal distention, abdominal pain and constipation.  Genitourinary: Negative.   Musculoskeletal: Negative.   Psychiatric/Behavioral:  Negative for behavioral problems and dysphoric mood.     Objective:  BP (!) 156/78   Pulse 66   Temp 98.1 F (36.7 C) (Oral)   Ht 5\' 3"  (1.6 m)   Wt 153 lb 6.4 oz (69.6 kg)   SpO2 100%   BMI 27.17 kg/m      12/12/2022    9:35 AM 12/12/2022    9:09 AM 11/05/2022    3:36 PM  BP/Weight  Systolic BP 156 168 128  Diastolic BP 78 79 92  Wt. (Lbs)  153.4   BMI  27.17 kg/m2       Physical Exam Constitutional:      Appearance: She is well-developed.  Cardiovascular:     Rate and Rhythm: Normal rate.     Heart sounds: Normal heart sounds. No murmur heard. Pulmonary:     Effort: Pulmonary effort is normal.     Breath sounds: Normal breath sounds. No wheezing or rales.  Chest:     Chest wall: No tenderness.  Abdominal:     General: Bowel sounds are normal. There is no distension.     Palpations: Abdomen is soft. There is no mass.     Tenderness: There is no abdominal tenderness.  Musculoskeletal:        General:  Normal range of motion.     Right lower leg: Edema (1+ non pittting) present.     Left lower leg: Edema (1+ non pitting) present.  Neurological:     Mental Status: She is alert and oriented to person, place, and time.  Psychiatric:        Mood and Affect: Mood normal.    Diabetic Foot Exam - Simple   Simple Foot Form Diabetic Foot exam was performed with the following findings: Yes 12/12/2022  9:26 AM  Visual Inspection See comments: Yes Sensation Testing Intact to touch and monofilament testing bilaterally: Yes Pulse Check Posterior Tibialis and Dorsalis pulse intact bilaterally: Yes Comments Dark thickened great toenails bilaterally.  Callus on medial aspect of left great toe.  No ulceration        Latest Ref Rng & Units 09/11/2022   11:46 AM 08/08/2022    3:31 AM 08/07/2022    3:29 AM  CMP  Glucose 70 - 99 mg/dL 161  096  045   BUN 8 - 27 mg/dL 19  10  24    Creatinine 0.57 - 1.00 mg/dL 4.09  8.11  9.14   Sodium 134 - 144 mmol/L 140  137  135   Potassium 3.5 - 5.2 mmol/L 4.5  3.5  3.2   Chloride 96 - 106 mmol/L 103  103  101   CO2 20 - 29 mmol/L 22  28  24    Calcium 8.7 - 10.3 mg/dL 78.2  8.4  8.3   Total Protein 6.0 - 8.5 g/dL 6.8  5.2    Total Bilirubin 0.0 - 1.2 mg/dL 0.4  0.7    Alkaline Phos 44 - 121 IU/L 117  69    AST 0 - 40 IU/L 18  18  ALT 0 - 32 IU/L 12  12      Lipid Panel     Component Value Date/Time   CHOL 219 (H) 09/11/2022 1146   TRIG 98 09/11/2022 1146   HDL 64 09/11/2022 1146   CHOLHDL 3.6 03/01/2021 0902   CHOLHDL 3.2 12/15/2013 1011   VLDL 26 12/15/2013 1011   LDLCALC 138 (H) 09/11/2022 1146    CBC    Component Value Date/Time   WBC 8.1 09/12/2022 0932   RBC 4.34 09/12/2022 0932   HGB 12.5 09/12/2022 0932   HGB 12.4 03/01/2021 0902   HCT 37.8 09/12/2022 0932   HCT 38.2 03/01/2021 0902   PLT 264.0 09/12/2022 0932   PLT 258 03/01/2021 0902   MCV 86.9 09/12/2022 0932   MCV 89 03/01/2021 0902   MCH 28.7 08/08/2022 0331   MCHC 33.0  09/12/2022 0932   RDW 13.9 09/12/2022 0932   RDW 12.1 03/01/2021 0902   LYMPHSABS 2.5 09/12/2022 0932   LYMPHSABS 2.6 03/01/2021 0902   MONOABS 0.5 09/12/2022 0932   EOSABS 0.1 09/12/2022 0932   EOSABS 0.1 03/01/2021 0902   BASOSABS 0.0 09/12/2022 0932   BASOSABS 0.1 03/01/2021 0902    Lab Results  Component Value Date   HGBA1C 7.9 (A) 12/12/2022    Assessment & Plan:  1. Type 2 diabetes mellitus with other specified complication, with long-term current use of insulin (HCC) A1c of 7.9 down from 9.5 previously and she has been commended on improvement Increase Lantus from 30 units to 32 units Will work on obtaining approval for her CGM Counseled on Diabetic diet, my plate method, 161 minutes of moderate intensity exercise/week Blood sugar logs with fasting goals of 80-120 mg/dl, random of less than 096 and in the event of sugars less than 60 mg/dl or greater than 045 mg/dl encouraged to notify the clinic. Advised on the need for annual eye exams, annual foot exams, Pneumonia vaccine. - POCT glycosylated hemoglobin (Hb A1C) - Basic Metabolic Panel - gabapentin (NEURONTIN) 300 MG capsule; Take 1 capsule (300 mg total) by mouth at bedtime.  Dispense: 90 capsule; Refill: 1 - Insulin Glargine (BASAGLAR KWIKPEN) 100 UNIT/ML; Inject 32 Units into the skin daily.  Dispense: 30 mL; Refill: 6 - lisinopril (ZESTRIL) 20 MG tablet; Take 1 tablet (20 mg total) by mouth daily.  Dispense: 90 tablet; Refill: 1 - metFORMIN (GLUCOPHAGE-XR) 500 MG 24 hr tablet; Take 2 tablets (1,000 mg total) by mouth daily with breakfast.  Dispense: 180 tablet; Refill: 1 - LP+Non-HDL Cholesterol  2. Callus of foot - Ambulatory referral to Podiatry  3. Hypertension associated with diabetes (HCC) Uncontrolled Increase dose of chlorthalidone from 25 to 50 mg which will also be beneficial due to her pedal edema. Counseled on blood pressure goal of less than 130/80, low-sodium, DASH diet, medication compliance, 150  minutes of moderate intensity exercise per week. Discussed medication compliance, adverse effects. - chlorthalidone (HYGROTON) 50 MG tablet; Take 1 tablet (50 mg total) by mouth daily.  Dispense: 90 tablet; Refill: 1 - lisinopril (ZESTRIL) 20 MG tablet; Take 1 tablet (20 mg total) by mouth daily.  Dispense: 90 tablet; Refill: 1  4. Other diabetic neurological complication associated with type 2 diabetes mellitus (HCC) Stable - gabapentin (NEURONTIN) 300 MG capsule; Take 1 capsule (300 mg total) by mouth at bedtime.  Dispense: 90 capsule; Refill: 1  5. Hyperlipidemia associated with type 2 diabetes mellitus (HCC) Uncontrolled Atorvastatin was switched to rosuvastatin after last set of labs Will check lipid panel  today - rosuvastatin (CRESTOR) 20 MG tablet; Take 1 tablet (20 mg total) by mouth daily.  Dispense: 90 tablet; Refill: 1  6.  Likely dependent edema Encouraged to comply with a low-sodium diet, elevate feet, use compression stockings Chlorthalidone dose increased    Meds ordered this encounter  Medications   chlorthalidone (HYGROTON) 50 MG tablet    Sig: Take 1 tablet (50 mg total) by mouth daily.    Dispense:  90 tablet    Refill:  1    Dose increase   gabapentin (NEURONTIN) 300 MG capsule    Sig: Take 1 capsule (300 mg total) by mouth at bedtime.    Dispense:  90 capsule    Refill:  1   Insulin Glargine (BASAGLAR KWIKPEN) 100 UNIT/ML    Sig: Inject 32 Units into the skin daily.    Dispense:  30 mL    Refill:  6   lisinopril (ZESTRIL) 20 MG tablet    Sig: Take 1 tablet (20 mg total) by mouth daily.    Dispense:  90 tablet    Refill:  1   metFORMIN (GLUCOPHAGE-XR) 500 MG 24 hr tablet    Sig: Take 2 tablets (1,000 mg total) by mouth daily with breakfast.    Dispense:  180 tablet    Refill:  1   rosuvastatin (CRESTOR) 20 MG tablet    Sig: Take 1 tablet (20 mg total) by mouth daily.    Dispense:  90 tablet    Refill:  1    Follow-up: Return in about 3 months  (around 03/14/2023) for Chronic medical conditions.       Hoy Register, MD, FAAFP. [facility] [place], [place] [phone]   12/12/2022, 10:02 AM

## 2022-12-12 NOTE — Patient Instructions (Signed)
Edema  Edema is when you have too much fluid in your body or under your skin. Edema may make your legs, feet, and ankles swell. Swelling often happens in looser tissues, such as around your eyes. This is a common condition. It gets more common as you get older. There are many possible causes of edema. These include: Eating too much salt (sodium). Being on your feet or sitting for a long time. Certain medical conditions, such as: Pregnancy. Heart failure. Liver disease. Kidney disease. Cancer. Hot weather may make edema worse. Edema is usually painless. Your skin may look swollen or shiny. Follow these instructions at home: Medicines Take over-the-counter and prescription medicines only as told by your doctor. Your doctor may prescribe a medicine to help your body get rid of extra water (diuretic). Take this medicine if you are told to take it. Eating and drinking Eat a low-salt (low-sodium) diet as told by your doctor. Sometimes, eating less salt may reduce swelling. Depending on the cause of your swelling, you may need to limit how much fluid you drink (fluid restriction). General instructions Raise the injured area above the level of your heart while you are sitting or lying down. Do not sit still or stand for a long time. Do not wear tight clothes. Do not wear garters on your upper legs. Exercise your legs. This can help the swelling go down. Wear compression stockings as told by your doctor. It is important that these are the right size. These should be prescribed by your doctor to prevent possible injuries. If elastic bandages or wraps are recommended, use them as told by your doctor. Contact a doctor if: Treatment is not working. You have heart, liver, or kidney disease and have symptoms of edema. You have sudden and unexplained weight gain. Get help right away if: You have shortness of breath or chest pain. You cannot breathe when you lie down. You have pain, redness, or  warmth in the swollen areas. You have heart, liver, or kidney disease and get edema all of a sudden. You have a fever and your symptoms get worse all of a sudden. These symptoms may be an emergency. Get help right away. Call 911. Do not wait to see if the symptoms will go away. Do not drive yourself to the hospital. Summary Edema is when you have too much fluid in your body or under your skin. Edema may make your legs, feet, and ankles swell. Swelling often happens in looser tissues, such as around your eyes. Raise the injured area above the level of your heart while you are sitting or lying down. Follow your doctor's instructions about diet and how much fluid you can drink. This information is not intended to replace advice given to you by your health care provider. Make sure you discuss any questions you have with your health care provider. Document Revised: 02/19/2021 Document Reviewed: 02/19/2021 Elsevier Patient Education  2024 Elsevier Inc.  

## 2022-12-12 NOTE — Telephone Encounter (Signed)
This patient is having a hard time obtaining her CGM. Can you please assist? Thanks

## 2022-12-12 NOTE — Progress Notes (Signed)
Swelling in both legs.

## 2022-12-13 ENCOUNTER — Ambulatory Visit (INDEPENDENT_AMBULATORY_CARE_PROVIDER_SITE_OTHER): Payer: Medicare Other | Admitting: Podiatry

## 2022-12-13 ENCOUNTER — Other Ambulatory Visit: Payer: Self-pay

## 2022-12-13 DIAGNOSIS — M79674 Pain in right toe(s): Secondary | ICD-10-CM

## 2022-12-13 DIAGNOSIS — M79675 Pain in left toe(s): Secondary | ICD-10-CM

## 2022-12-13 DIAGNOSIS — E113312 Type 2 diabetes mellitus with moderate nonproliferative diabetic retinopathy with macular edema, left eye: Secondary | ICD-10-CM | POA: Diagnosis not present

## 2022-12-13 DIAGNOSIS — B351 Tinea unguium: Secondary | ICD-10-CM

## 2022-12-13 DIAGNOSIS — Z794 Long term (current) use of insulin: Secondary | ICD-10-CM | POA: Diagnosis not present

## 2022-12-13 DIAGNOSIS — Z961 Presence of intraocular lens: Secondary | ICD-10-CM | POA: Diagnosis not present

## 2022-12-13 DIAGNOSIS — H401132 Primary open-angle glaucoma, bilateral, moderate stage: Secondary | ICD-10-CM | POA: Diagnosis not present

## 2022-12-13 LAB — BASIC METABOLIC PANEL
BUN/Creatinine Ratio: 10 — ABNORMAL LOW (ref 12–28)
BUN: 11 mg/dL (ref 8–27)
CO2: 23 mmol/L (ref 20–29)
Calcium: 9.4 mg/dL (ref 8.7–10.3)
Chloride: 104 mmol/L (ref 96–106)
Creatinine, Ser: 1.06 mg/dL — ABNORMAL HIGH (ref 0.57–1.00)
Glucose: 199 mg/dL — ABNORMAL HIGH (ref 70–99)
Potassium: 4 mmol/L (ref 3.5–5.2)
Sodium: 142 mmol/L (ref 134–144)
eGFR: 55 mL/min/{1.73_m2} — ABNORMAL LOW (ref >59)

## 2022-12-13 LAB — LP+NON-HDL CHOLESTEROL
Cholesterol, Total: 148 mg/dL (ref 100–199)
HDL: 64 mg/dL (ref >39)
LDL Chol Calc (NIH): 65 mg/dL (ref 0–99)
Total Non-HDL-Chol (LDL+VLDL): 84 mg/dL (ref 0–129)
Triglycerides: 107 mg/dL (ref 0–149)
VLDL Cholesterol Cal: 19 mg/dL (ref 5–40)

## 2022-12-13 MED ORDER — BRIMONIDINE TARTRATE 0.2 % OP SOLN
1.0000 [drp] | Freq: Two times a day (BID) | OPHTHALMIC | 12 refills | Status: DC
  Filled 2022-12-13: qty 5, 25d supply, fill #0
  Filled 2023-02-28: qty 10, 50d supply, fill #0
  Filled 2023-06-26: qty 10, 50d supply, fill #1
  Filled 2023-10-23: qty 10, 50d supply, fill #2

## 2022-12-13 MED ORDER — LATANOPROST 0.005 % OP SOLN
1.0000 [drp] | Freq: Every evening | OPHTHALMIC | 12 refills | Status: AC
  Filled 2022-12-13 – 2023-02-28 (×2): qty 2.5, 25d supply, fill #0
  Filled 2023-06-26: qty 2.5, 25d supply, fill #1

## 2022-12-13 MED ORDER — DORZOLAMIDE HCL-TIMOLOL MAL 2-0.5 % OP SOLN
1.0000 [drp] | Freq: Two times a day (BID) | OPHTHALMIC | 12 refills | Status: DC
  Filled 2022-12-13: qty 10, 100d supply, fill #0
  Filled 2023-02-28: qty 10, 50d supply, fill #0
  Filled 2023-06-26: qty 10, 50d supply, fill #1
  Filled 2023-10-23: qty 10, 50d supply, fill #2

## 2022-12-13 NOTE — Progress Notes (Unsigned)
       Subjective:  Patient ID: [name], female    DOB: 15-Apr-1946,  MRN: [id number]   Tina Phelps presents to clinic today for:  Chief Complaint  Patient presents with   Nail Problem    Diabetic Foot Care- Patient is currently taking oral medication and insulin for diabetes control.  Nail fungus - Patient has used OTC topical solution for nails.   . Patient notes nails are thick, discolored, elongated and painful in shoegear when trying to ambulate.  HbA1c ~ 7  PCP is Hoy Register, MD.  Allergies  Allergen Reactions   Pork-Derived Products     Religious preference- non halal    Review of Systems: Negative except as noted in the HPI.  Objective:  There were no vitals filed for this visit.  Tina Phelps is a pleasant 77 y.o. female in NAD. AAO x 3.  Vascular Examination: Capillary refill time is 3-5 seconds to toes bilateral. Palpable pedal pulses b/l LE. Digital hair present b/l. No pedal edema b/l. Skin temperature gradient WNL b/l. No varicosities b/l. No cyanosis or clubbing noted b/l.   Dermatological Examination: Pedal skin with normal turgor, texture and tone b/l. No open wounds. No interdigital macerations b/l. Toenails x10 are 3mm thick, discolored, dystrophic with subungual debris. There is pain with compression of the nail plates.  They are elongated x10  Neurological Examination: Protective sensation intact bilateral LE. Vibratory sensation intact bilateral LE.  Musculoskeletal Examination: Muscle strength 5/5 to all LE muscle groups b/l.      Latest Ref Rng & Units 12/12/2022    9:21 AM 08/06/2022    3:08 AM 05/22/2022    3:41 PM 12/27/2021   10:44 AM  Hemoglobin A1C  Hemoglobin-A1c 0.0 - 7.0 % 7.9  9.5  8.5  7.7      Assessment/Plan: No diagnosis found.  No orders of the defined types were placed in this encounter.  None  The mycotic toenails were sharply debrided x10 with sterile nail nippers and a power debriding burr to decrease  bulk/thickness and length.    Return in about 3 months (around 03/15/2023) for [facility] / nail trim / using OTC anti-fungal.   Ethelene Closser DBurna Mortimer, DPM, FACFAS [facility]     [address] [place], [place] [address]                Office [phone]  Fax [phone]

## 2022-12-15 ENCOUNTER — Encounter: Payer: Self-pay | Admitting: Podiatry

## 2022-12-15 DIAGNOSIS — B351 Tinea unguium: Secondary | ICD-10-CM | POA: Insufficient documentation

## 2022-12-19 ENCOUNTER — Other Ambulatory Visit: Payer: Self-pay

## 2022-12-20 DIAGNOSIS — H35033 Hypertensive retinopathy, bilateral: Secondary | ICD-10-CM | POA: Diagnosis not present

## 2022-12-20 DIAGNOSIS — E113312 Type 2 diabetes mellitus with moderate nonproliferative diabetic retinopathy with macular edema, left eye: Secondary | ICD-10-CM | POA: Diagnosis not present

## 2022-12-20 DIAGNOSIS — H44521 Atrophy of globe, right eye: Secondary | ICD-10-CM | POA: Diagnosis not present

## 2022-12-20 DIAGNOSIS — H401132 Primary open-angle glaucoma, bilateral, moderate stage: Secondary | ICD-10-CM | POA: Diagnosis not present

## 2022-12-20 DIAGNOSIS — Z961 Presence of intraocular lens: Secondary | ICD-10-CM | POA: Diagnosis not present

## 2023-02-28 ENCOUNTER — Other Ambulatory Visit: Payer: Self-pay

## 2023-03-04 ENCOUNTER — Other Ambulatory Visit: Payer: Self-pay

## 2023-03-14 ENCOUNTER — Encounter: Payer: Self-pay | Admitting: Pharmacist

## 2023-03-18 ENCOUNTER — Other Ambulatory Visit: Payer: Self-pay | Admitting: Pharmacist

## 2023-03-18 ENCOUNTER — Ambulatory Visit: Payer: Medicare Other | Attending: Family Medicine | Admitting: Family Medicine

## 2023-03-18 ENCOUNTER — Other Ambulatory Visit: Payer: Self-pay

## 2023-03-18 ENCOUNTER — Telehealth: Payer: Self-pay | Admitting: Pharmacist

## 2023-03-18 ENCOUNTER — Telehealth: Payer: Self-pay | Admitting: Family Medicine

## 2023-03-18 ENCOUNTER — Encounter: Payer: Medicare Other | Admitting: Podiatry

## 2023-03-18 ENCOUNTER — Encounter: Payer: Self-pay | Admitting: Family Medicine

## 2023-03-18 VITALS — BP 157/79 | HR 67 | Ht 63.0 in | Wt 149.0 lb

## 2023-03-18 DIAGNOSIS — K219 Gastro-esophageal reflux disease without esophagitis: Secondary | ICD-10-CM | POA: Diagnosis not present

## 2023-03-18 DIAGNOSIS — E1149 Type 2 diabetes mellitus with other diabetic neurological complication: Secondary | ICD-10-CM

## 2023-03-18 DIAGNOSIS — Z8349 Family history of other endocrine, nutritional and metabolic diseases: Secondary | ICD-10-CM | POA: Insufficient documentation

## 2023-03-18 DIAGNOSIS — I152 Hypertension secondary to endocrine disorders: Secondary | ICD-10-CM

## 2023-03-18 DIAGNOSIS — Z7984 Long term (current) use of oral hypoglycemic drugs: Secondary | ICD-10-CM | POA: Diagnosis not present

## 2023-03-18 DIAGNOSIS — E1169 Type 2 diabetes mellitus with other specified complication: Secondary | ICD-10-CM

## 2023-03-18 DIAGNOSIS — Z79899 Other long term (current) drug therapy: Secondary | ICD-10-CM | POA: Insufficient documentation

## 2023-03-18 DIAGNOSIS — E1165 Type 2 diabetes mellitus with hyperglycemia: Secondary | ICD-10-CM

## 2023-03-18 DIAGNOSIS — E1159 Type 2 diabetes mellitus with other circulatory complications: Secondary | ICD-10-CM

## 2023-03-18 DIAGNOSIS — M1990 Unspecified osteoarthritis, unspecified site: Secondary | ICD-10-CM | POA: Diagnosis not present

## 2023-03-18 DIAGNOSIS — E1142 Type 2 diabetes mellitus with diabetic polyneuropathy: Secondary | ICD-10-CM | POA: Insufficient documentation

## 2023-03-18 DIAGNOSIS — E785 Hyperlipidemia, unspecified: Secondary | ICD-10-CM | POA: Diagnosis not present

## 2023-03-18 DIAGNOSIS — Z794 Long term (current) use of insulin: Secondary | ICD-10-CM

## 2023-03-18 LAB — POCT GLYCOSYLATED HEMOGLOBIN (HGB A1C): HbA1c, POC (controlled diabetic range): 9.4 % — AB (ref 0.0–7.0)

## 2023-03-18 LAB — GLUCOSE, POCT (MANUAL RESULT ENTRY): POC Glucose: 118 mg/dL — AB (ref 70–99)

## 2023-03-18 MED ORDER — INSULIN ASPART 100 UNIT/ML IJ SOLN
0.0000 [IU] | Freq: Three times a day (TID) | INTRAMUSCULAR | 99 refills | Status: DC
Start: 2023-03-18 — End: 2023-03-18
  Filled 2023-03-18: qty 10, 28d supply, fill #0

## 2023-03-18 MED ORDER — DEXCOM G7 SENSOR MISC: 6 refills | Status: DC

## 2023-03-18 MED ORDER — AMLODIPINE BESYLATE 2.5 MG PO TABS
2.5000 mg | ORAL_TABLET | Freq: Every day | ORAL | 1 refills | Status: DC
  Filled 2023-03-18: qty 90, 90d supply, fill #0

## 2023-03-18 MED ORDER — METFORMIN HCL ER 500 MG PO TB24
1000.0000 mg | ORAL_TABLET | Freq: Every day | ORAL | 1 refills | Status: DC
Start: 2023-03-18 — End: 2023-10-23
  Filled 2023-03-18 – 2023-06-26 (×2): qty 180, 90d supply, fill #0
  Filled 2023-10-23: qty 180, 90d supply, fill #1

## 2023-03-18 MED ORDER — GABAPENTIN 300 MG PO CAPS
300.0000 mg | ORAL_CAPSULE | Freq: Every day | ORAL | 1 refills | Status: DC
Start: 2023-03-18 — End: 2023-11-26
  Filled 2023-03-18 – 2023-06-26 (×2): qty 90, 90d supply, fill #0
  Filled 2023-10-23: qty 90, 90d supply, fill #1

## 2023-03-18 MED ORDER — NOVOLOG FLEXPEN 100 UNIT/ML ~~LOC~~ SOPN
PEN_INJECTOR | SUBCUTANEOUS | 3 refills | Status: DC
  Filled 2023-03-18: qty 3, 25d supply, fill #0

## 2023-03-18 MED ORDER — LISINOPRIL 20 MG PO TABS
20.0000 mg | ORAL_TABLET | Freq: Every day | ORAL | 1 refills | Status: DC
Start: 2023-03-18 — End: 2023-11-09
  Filled 2023-03-18 – 2023-06-26 (×2): qty 90, 90d supply, fill #0
  Filled 2023-10-23: qty 90, 90d supply, fill #1

## 2023-03-18 MED ORDER — DEXCOM G7 RECEIVER DEVI: 0 refills | Status: DC

## 2023-03-18 MED ORDER — CHLORTHALIDONE 50 MG PO TABS
50.0000 mg | ORAL_TABLET | Freq: Every day | ORAL | 1 refills | Status: DC
Start: 2023-03-18 — End: 2023-11-08
  Filled 2023-03-18: qty 90, 90d supply, fill #0

## 2023-03-18 MED ORDER — ACCU-CHEK SOFTCLIX LANCETS MISC
6 refills | Status: DC
Start: 2023-03-18 — End: 2023-11-08

## 2023-03-18 MED ORDER — INSULIN GLARGINE SOLOSTAR 100 UNIT/ML ~~LOC~~ SOPN
32.0000 [IU] | PEN_INJECTOR | Freq: Every day | SUBCUTANEOUS | 6 refills | Status: DC
Start: 2023-03-18 — End: 2023-11-03
  Filled 2023-03-18 – 2023-08-01 (×3): qty 30, 93d supply, fill #0
  Filled 2023-10-23: qty 30, 93d supply, fill #1
  Filled 2023-11-03: qty 15, 30d supply, fill #1

## 2023-03-18 MED ORDER — ACCU-CHEK GUIDE VI STRP
ORAL_STRIP | 6 refills | Status: DC
Start: 2023-03-18 — End: 2023-11-08

## 2023-03-18 MED ORDER — ACCU-CHEK GUIDE W/DEVICE KIT
PACK | 0 refills | Status: DC
Start: 2023-03-18 — End: 2023-03-18
  Filled 2023-03-18: qty 1, fill #0

## 2023-03-18 MED ORDER — ACCU-CHEK SOFTCLIX LANCETS MISC
12 refills | Status: DC
  Filled 2023-03-18: qty 100, fill #0

## 2023-03-18 MED ORDER — ACCU-CHEK GUIDE W/DEVICE KIT
PACK | 0 refills | Status: DC
Start: 2023-03-18 — End: 2023-11-08

## 2023-03-18 MED ORDER — ACCU-CHEK GUIDE VI STRP
ORAL_STRIP | 12 refills | Status: DC
  Filled 2023-03-18: qty 100, fill #0

## 2023-03-18 MED ORDER — ROSUVASTATIN CALCIUM 20 MG PO TABS
20.0000 mg | ORAL_TABLET | Freq: Every day | ORAL | 1 refills | Status: DC
  Filled 2023-03-18 – 2023-06-26 (×2): qty 90, 90d supply, fill #0
  Filled 2023-10-23: qty 90, 90d supply, fill #1

## 2023-03-18 NOTE — Telephone Encounter (Signed)
Hey friend. This patient has Medicare B. I sent Dexcom G7 over to her preferred [facility] with a note to pharmacy to bill via part B. Do we still need to complete a PA?

## 2023-03-18 NOTE — Telephone Encounter (Signed)
Can you please assist with her CGM?  I sent a prescription for her earlier in the year but she is yet to receive this.  Thank you.

## 2023-03-18 NOTE — Patient Instructions (Signed)
Managing Your Hypertension Hypertension, also called high blood pressure, is when the force of the blood pressing against the walls of the arteries is too strong. Arteries are blood vessels that carry blood from your heart throughout your body. Hypertension forces the heart to work harder to pump blood and may cause the arteries to become narrow or stiff. Understanding blood pressure readings A blood pressure reading includes a higher number over a lower number: The first, or top, number is called the systolic pressure. It is a measure of the pressure in your arteries as your heart beats. The second, or bottom number, is called the diastolic pressure. It is a measure of the pressure in your arteries as the heart relaxes. For most people, a normal blood pressure is below 120/80. Your personal target blood pressure may vary depending on your medical conditions, your age, and other factors. Blood pressure is classified into four stages. Based on your blood pressure reading, your health care provider may use the following stages to determine what type of treatment you need, if any. Systolic pressure and diastolic pressure are measured in a unit called millimeters of mercury (mmHg). Normal Systolic pressure: below 120. Diastolic pressure: below 80. Elevated Systolic pressure: 120-129. Diastolic pressure: below 80. Hypertension stage 1 Systolic pressure: 130-139. Diastolic pressure: 80-89. Hypertension stage 2 Systolic pressure: 140 or above. Diastolic pressure: 90 or above. How can this condition affect me? Managing your hypertension is very important. Over time, hypertension can damage the arteries and decrease blood flow to parts of the body, including the brain, heart, and kidneys. Having untreated or uncontrolled hypertension can lead to: A heart attack. A stroke. A weakened blood vessel (aneurysm). Heart failure. Kidney damage. Eye damage. Memory and concentration problems. Vascular  dementia. What actions can I take to manage this condition? Hypertension can be managed by making lifestyle changes and possibly by taking medicines. Your health care provider will help you make a plan to bring your blood pressure within a normal range. You may be referred for counseling on a healthy diet and physical activity. Nutrition  Eat a diet that is high in fiber and potassium, and low in salt (sodium), added sugar, and fat. An example eating plan is called the DASH diet. DASH stands for Dietary Approaches to Stop Hypertension. To eat this way: Eat plenty of fresh fruits and vegetables. Try to fill one-half of your plate at each meal with fruits and vegetables. Eat whole grains, such as whole-wheat pasta, brown rice, or whole-grain bread. Fill about one-fourth of your plate with whole grains. Eat low-fat dairy products. Avoid fatty cuts of meat, processed or cured meats, and poultry with skin. Fill about one-fourth of your plate with lean proteins such as fish, chicken without skin, beans, eggs, and tofu. Avoid pre-made and processed foods. These tend to be higher in sodium, added sugar, and fat. Reduce your daily sodium intake. Many people with hypertension should eat less than 1,500 mg of sodium a day. Lifestyle  Work with your health care provider to maintain a healthy body weight or to lose weight. Ask what an ideal weight is for you. Get at least 30 minutes of exercise that causes your heart to beat faster (aerobic exercise) most days of the week. Activities may include walking, swimming, or biking. Include exercise to strengthen your muscles (resistance exercise), such as weight lifting, as part of your weekly exercise routine. Try to do these types of exercises for 30 minutes at least 3 days a week. Do  not use any products that contain nicotine or tobacco. These products include cigarettes, chewing tobacco, and vaping devices, such as e-cigarettes. If you need help quitting, ask your  health care provider. Control any long-term (chronic) conditions you have, such as high cholesterol or diabetes. Identify your sources of stress and find ways to manage stress. This may include meditation, deep breathing, or making time for fun activities. Alcohol use Do not drink alcohol if: Your health care provider tells you not to drink. You are pregnant, may be pregnant, or are planning to become pregnant. If you drink alcohol: Limit how much you have to: 0-1 drink a day for women. 0-2 drinks a day for men. Know how much alcohol is in your drink. In the [place] one drink equals one 12 oz bottle of beer (355 mL), one 5 oz glass of wine (148 mL), or one 1 oz glass of hard liquor (44 mL). Medicines Your health care provider may prescribe medicine if lifestyle changes are not enough to get your blood pressure under control and if: Your systolic blood pressure is 130 or higher. Your diastolic blood pressure is 80 or higher. Take medicines only as told by your health care provider. Follow the directions carefully. Blood pressure medicines must be taken as told by your health care provider. The medicine does not work as well when you skip doses. Skipping doses also puts you at risk for problems. Monitoring Before you monitor your blood pressure: Do not smoke, drink caffeinated beverages, or exercise within 30 minutes before taking a measurement. Use the bathroom and empty your bladder (urinate). Sit quietly for at least 5 minutes before taking measurements. Monitor your blood pressure at home as told by your health care provider. To do this: Sit with your back straight and supported. Place your feet flat on the floor. Do not cross your legs. Support your arm on a flat surface, such as a table. Make sure your upper arm is at heart level. Each time you measure, take two or three readings one minute apart and record the results. You may also need to have your blood pressure checked regularly by  your health care provider. General information Talk with your health care provider about your diet, exercise habits, and other lifestyle factors that may be contributing to hypertension. Review all the medicines you take with your health care provider because there may be side effects or interactions. Keep all follow-up visits. Your health care provider can help you create and adjust your plan for managing your high blood pressure. Where to find more information National Heart, Lung, and Blood Institute: PopSteam.is American Heart Association: www.heart.org Contact a health care provider if: You think you are having a reaction to medicines you have taken. You have repeated (recurrent) headaches. You feel dizzy. You have swelling in your ankles. You have trouble with your vision. Get help right away if: You develop a severe headache or confusion. You have unusual weakness or numbness, or you feel faint. You have severe pain in your chest or abdomen. You vomit repeatedly. You have trouble breathing. These symptoms may be an emergency. Get help right away. Call 911. Do not wait to see if the symptoms will go away. Do not drive yourself to the hospital. Summary Hypertension is when the force of blood pumping through your arteries is too strong. If this condition is not controlled, it may put you at risk for serious complications. Your personal target blood pressure may vary depending on your medical conditions,  your age, and other factors. For most people, a normal blood pressure is less than 120/80. Hypertension is managed by lifestyle changes, medicines, or both. Lifestyle changes to help manage hypertension include losing weight, eating a healthy, low-sodium diet, exercising more, stopping smoking, and limiting alcohol. This information is not intended to replace advice given to you by your health care provider. Make sure you discuss any questions you have with your health care  provider. Document Revised: 03/01/2021 Document Reviewed: 03/01/2021 Elsevier Patient Education  2024 [employer].

## 2023-03-18 NOTE — Telephone Encounter (Signed)
Yes ma'am, we are working with her Medicare part B plan. This must be filled at her [facility]. Once we get word from Medicare, we will contact her.

## 2023-03-18 NOTE — Addendum Note (Signed)
Addended by: Lois Huxley, Jeannett Senior L on: 03/18/2023 06:11 PM   Modules accepted: Orders

## 2023-03-18 NOTE — Progress Notes (Signed)
Subjective:  Patient ID: Tina Phelps, female    DOB: 20-Jul-1945  Age: 77 y.o. MRN: [id number]  CC: Medical Management of Chronic Issues   HPI Bethenny Sheard is a 77 y.o. year old female with a history of type 2 diabetes mellitus (A1c 7.9), hypertension, hyperlipidemia, osteoarthritis, GERD, s/p R nephrectomy (In [place] secondary to 'kidney cyst') .  Interval History: Discussed the use of AI scribe software for clinical note transcription with the patient, who gave verbal consent to proceed.  She presents with a recent increase in HbA1c from 7.9 to 9.4. She has been unable to monitor her blood glucose at home due to issues with obtaining a glucose sensor for her CGM She has been waiting for approval for the sensor from Medicare and has not received it yet. She has been using Basaglar insulin 32 units daily. She reports continuing to walk for exercise and denies any significant dietary indiscretions. She also reports a history of insulin resistance in her family.  The patient also has hypertension, which has been consistently high at recent visits. She reports taking her antihypertensive medications, Lisinopril and Carvedilol, regularly. She has been monitoring her blood pressure at home and reports readings around 130/83 on some ocassions she has had systolic of 150 She also has a history of neuropathy for which she takes Gabapentin at bedtime.        Past Medical History:  Diagnosis Date   Arthritis    Diabetes mellitus without complication (HCC)    Glaucoma    Hypertension     Past Surgical History:  Procedure Laterality Date   CHOLECYSTECTOMY     NEPHRECTOMY Right    OOPHORECTOMY Right     Family History  Problem Relation Age of Onset   Diabetes Sister    Diabetes Brother    Diabetes Daughter     Social History   Socioeconomic History   Marital status: Single    Spouse name: Not on file   Number of children: Not on file   Years of education: Not on file   Highest  education level: Not on file  Occupational History   Not on file  Tobacco Use   Smoking status: Never   Smokeless tobacco: Never  Vaping Use   Vaping status: Never Used  Substance and Sexual Activity   Alcohol use: No   Drug use: No   Sexual activity: Not Currently  Other Topics Concern   Not on file  Social History Narrative   Not on file   Social Determinants of Health   Financial Resource Strain: Low Risk  (10/01/2022)   Overall Financial Resource Strain (CARDIA)    Difficulty of Paying Living Expenses: Not hard at all  Food Insecurity: No Food Insecurity (10/01/2022)   Hunger Vital Sign    Worried About Running Out of Food in the Last Year: Never true    Ran Out of Food in the Last Year: Never true  Transportation Needs: No Transportation Needs (10/01/2022)   PRAPARE - Administrator, Civil Service (Medical): No    Lack of Transportation (Non-Medical): No  Physical Activity: Insufficiently Active (10/01/2022)   Exercise Vital Sign    Days of Exercise per Week: 5 days    Minutes of Exercise per Session: 20 min  Stress: No Stress Concern Present (10/01/2022)   [employer] of Occupational Health - Occupational Stress Questionnaire    Feeling of Stress : Not at all  Social Connections: Unknown (11/13/2021)   Received  from [facility]   Social Network    Social Network: Not on file    Allergies  Allergen Reactions   Pork-Derived Products     Religious preference- non halal    Outpatient Medications Prior to Visit  Medication Sig Dispense Refill   ACCU-CHEK FASTCLIX LANCETS MISC Use as directed 100 each 3   Bismuth 262 MG CHEW Chew 524 mg by mouth in the morning, at noon, in the evening, and at bedtime. 112 tablet 0   brimonidine (ALPHAGAN) 0.2 % ophthalmic solution Place 1 drop into both eyes 2 (two) times daily. 15 mL 12   ciclopirox (PENLAC) 8 % solution Apply topically at bedtime. Apply over nail and surrounding skin. Apply daily over  previous coat. After seven (7) days, may remove with alcohol and continue cycle. 6.6 mL 0   Continuous Blood Gluc Receiver (DEXCOM G6 RECEIVER) DEVI Use to check blood sugar three times daily. 1 each 0   Continuous Glucose Sensor (DEXCOM G6 SENSOR) MISC Use to check blood sugar three times daily. Change sensor once every 10 days. E11.65 3 each 2   Continuous Glucose Transmitter (DEXCOM G6 TRANSMITTER) MISC Use to check blood sugar three times daily. Change transmitter once every 90 days. E11.65 1 each 1   diclofenac sodium (VOLTAREN) 1 % GEL Apply 2 g topically 4 (four) times daily. (Patient taking differently: Apply 2 g topically daily as needed (for shoulder pain).) 300 g 3   dorzolamide-timolol (COSOPT) 2-0.5 % ophthalmic solution Place 1 drop into both eyes 2 (two) times daily. 15 mL 12   doxycycline (VIBRAMYCIN) 100 MG capsule Take 1 capsule (100 mg total) by mouth 2 (two) times daily. 28 capsule 0   Insulin Pen Needle (B-D UF III MINI PEN NEEDLES) 31G X 5 MM MISC USE AS DIRECTED 100 each 1   latanoprost (XALATAN) 0.005 % ophthalmic solution Instill 1 drop into both eyes every evening 7.5 mL 12   latanoprost (XALATAN) 0.005 % ophthalmic solution Place 1 drop into both eyes every evening. 2.5 mL 12   metroNIDAZOLE (FLAGYL) 500 MG tablet Take 1 tablet (500 mg total) by mouth 2 (two) times daily. 20 tablet 0   MURO 128 5 % ophthalmic ointment Place 1 Application into both eyes every evening.     ondansetron (ZOFRAN-ODT) 8 MG disintegrating tablet Place 1 tablet twice a day by translingual route as needed for 3 days. 6 tablet 0   pantoprazole (PROTONIX) 40 MG tablet Take 1 tablet (40 mg total) by mouth 2 (two) times daily. 28 tablet 0   Accu-Chek Softclix Lancets lancets Use as instructed tid before meals 100 each 12   Blood Glucose Monitoring Suppl (ACCU-CHEK GUIDE) w/Device KIT Use as directed tid 1 kit 0   chlorthalidone (HYGROTON) 50 MG tablet Take 1 tablet (50 mg total) by mouth daily. 90  tablet 1   gabapentin (NEURONTIN) 300 MG capsule Take 1 capsule (300 mg total) by mouth at bedtime. 90 capsule 1   glucose blood (ACCU-CHEK GUIDE) test strip USE AS DIRECTED : TEST BLOOD SUGARS THREE TIMES A DAY 100 each 12   Insulin Glargine (BASAGLAR KWIKPEN) 100 UNIT/ML Inject 32 Units into the skin daily. 30 mL 6   lisinopril (ZESTRIL) 20 MG tablet Take 1 tablet (20 mg total) by mouth daily. 90 tablet 1   metFORMIN (GLUCOPHAGE-XR) 500 MG 24 hr tablet Take 2 tablets (1,000 mg total) by mouth daily with breakfast. 180 tablet 1   rosuvastatin (CRESTOR) 20  MG tablet Take 1 tablet (20 mg total) by mouth daily. 90 tablet 1   pantoprazole (PROTONIX) 40 MG tablet Take 1 tablet (40 mg total) by mouth daily. 90 tablet 0   sucralfate (CARAFATE) 1 g tablet Take 1 tablet (1 g total) by mouth 4 (four) times daily for 28 days. 112 tablet 0   No facility-administered medications prior to visit.     ROS Review of Systems  Constitutional:  Negative for activity change and appetite change.  HENT:  Negative for sinus pressure and sore throat.   Respiratory:  Negative for chest tightness, shortness of breath and wheezing.   Cardiovascular:  Negative for chest pain and palpitations.  Gastrointestinal:  Negative for abdominal distention, abdominal pain and constipation.  Genitourinary: Negative.   Musculoskeletal: Negative.   Neurological:  Positive for numbness.  Psychiatric/Behavioral:  Negative for behavioral problems and dysphoric mood.     Objective:  BP (!) 157/79   Pulse 67   Ht 5\' 3"  (1.6 m)   Wt 149 lb (67.6 kg)   SpO2 100%   BMI 26.39 kg/m      03/18/2023   11:35 AM 03/18/2023   10:40 AM 12/12/2022    9:35 AM  BP/Weight  Systolic BP 157 178 156  Diastolic BP 79 81 78  Wt. (Lbs)  149   BMI  26.39 kg/m2       Physical Exam Constitutional:      Appearance: She is well-developed.  Cardiovascular:     Rate and Rhythm: Normal rate.     Heart sounds: Normal heart sounds. No murmur  heard. Pulmonary:     Effort: Pulmonary effort is normal.     Breath sounds: Normal breath sounds. No wheezing or rales.  Chest:     Chest wall: No tenderness.  Abdominal:     General: Bowel sounds are normal. There is no distension.     Palpations: Abdomen is soft. There is no mass.     Tenderness: There is no abdominal tenderness.  Musculoskeletal:        General: Normal range of motion.     Right lower leg: No edema.     Left lower leg: No edema.  Neurological:     Mental Status: She is alert and oriented to person, place, and time.  Psychiatric:        Mood and Affect: Mood normal.        Latest Ref Rng & Units 12/12/2022    9:46 AM 09/11/2022   11:46 AM 08/08/2022    3:31 AM  CMP  Glucose 70 - 99 mg/dL 027  253  664   BUN 8 - 27 mg/dL 11  19  10    Creatinine 0.57 - 1.00 mg/dL 4.03  4.74  2.59   Sodium 134 - 144 mmol/L 142  140  137   Potassium 3.5 - 5.2 mmol/L 4.0  4.5  3.5   Chloride 96 - 106 mmol/L 104  103  103   CO2 20 - 29 mmol/L 23  22  28    Calcium 8.7 - 10.3 mg/dL 9.4  56.3  8.4   Total Protein 6.0 - 8.5 g/dL  6.8  5.2   Total Bilirubin 0.0 - 1.2 mg/dL  0.4  0.7   Alkaline Phos 44 - 121 IU/L  117  69   AST 0 - 40 IU/L  18  18   ALT 0 - 32 IU/L  12  12     Lipid Panel  Component Value Date/Time   CHOL 148 12/12/2022 0946   TRIG 107 12/12/2022 0946   HDL 64 12/12/2022 0946   CHOLHDL 3.6 03/01/2021 0902   CHOLHDL 3.2 12/15/2013 1011   VLDL 26 12/15/2013 1011   LDLCALC 65 12/12/2022 0946    CBC    Component Value Date/Time   WBC 8.1 09/12/2022 0932   RBC 4.34 09/12/2022 0932   HGB 12.5 09/12/2022 0932   HGB 12.4 03/01/2021 0902   HCT 37.8 09/12/2022 0932   HCT 38.2 03/01/2021 0902   PLT 264.0 09/12/2022 0932   PLT 258 03/01/2021 0902   MCV 86.9 09/12/2022 0932   MCV 89 03/01/2021 0902   MCH 28.7 08/08/2022 0331   MCHC 33.0 09/12/2022 0932   RDW 13.9 09/12/2022 0932   RDW 12.1 03/01/2021 0902   LYMPHSABS 2.5 09/12/2022 0932   LYMPHSABS  2.6 03/01/2021 0902   MONOABS 0.5 09/12/2022 0932   EOSABS 0.1 09/12/2022 0932   EOSABS 0.1 03/01/2021 0902   BASOSABS 0.0 09/12/2022 0932   BASOSABS 0.1 03/01/2021 0902    Lab Results  Component Value Date   HGBA1C 9.4 (A) 03/18/2023    Assessment & Plan:      Type 2 Diabetes Mellitus A1c increased to 9.4 from 7.9. Patient has not been able to monitor blood glucose at home due to issues with obtaining a glucose monitor. Insulin resistance noted in family history. Fasting blood glucose today is 118. -Resend prescription for glucose monitor and test strips. -Continue Basaglar 32 units daily. -Start NovoLog with meals using a sliding scale. -For blood sugars 0-150 give 0 units of insulin, 151-200 give 2 units of insulin, 201-250 give 4 units, 251-300 give 6 units, 301-350 give 8 units, 351-400 give 10 units,> 400 give 12 units and call M.D. Discussed hypoglycemia protocol. -Check A1c, cholesterol, and urine today.  Hypertension Blood pressure readings consistently elevated, including today's reading. Patient reports taking Lisinopril and Carvedilol as prescribed. -Start Amlodipine 2.5mg  daily. -Recheck blood pressure in one week with a nurse visit and if blood pressure is still above 140/80, increase amlodipine to 5 mg daily -Low-sodium diet -Schedule follow-up appointment in three months.  Peripheral Neuropathy Patient continues to take Gabapentin 300mg  at bedtime. -Continue current treatment.   Hyperlipidemia Currently on statin Will check lipid panel today         Meds ordered this encounter  Medications   chlorthalidone (HYGROTON) 50 MG tablet    Sig: Take 1 tablet (50 mg total) by mouth daily.    Dispense:  90 tablet    Refill:  1    Dose increase   gabapentin (NEURONTIN) 300 MG capsule    Sig: Take 1 capsule (300 mg total) by mouth at bedtime.    Dispense:  90 capsule    Refill:  1   Insulin Glargine (BASAGLAR KWIKPEN) 100 UNIT/ML    Sig: Inject 32 Units  into the skin daily.    Dispense:  30 mL    Refill:  6   lisinopril (ZESTRIL) 20 MG tablet    Sig: Take 1 tablet (20 mg total) by mouth daily.    Dispense:  90 tablet    Refill:  1   metFORMIN (GLUCOPHAGE-XR) 500 MG 24 hr tablet    Sig: Take 2 tablets (1,000 mg total) by mouth daily with breakfast.    Dispense:  180 tablet    Refill:  1   rosuvastatin (CRESTOR) 20 MG tablet    Sig: Take 1 tablet (20  mg total) by mouth daily.    Dispense:  90 tablet    Refill:  1   Accu-Chek Softclix Lancets lancets    Sig: Use as instructed three times before meals    Dispense:  100 each    Refill:  12   Blood Glucose Monitoring Suppl (ACCU-CHEK GUIDE) w/Device KIT    Sig: Use as directed tid    Dispense:  1 kit    Refill:  0   glucose blood (ACCU-CHEK GUIDE) test strip    Sig: USE AS DIRECTED : TEST BLOOD SUGARS THREE TIMES A DAY    Dispense:  100 each    Refill:  12   amLODipine (NORVASC) 2.5 MG tablet    Sig: Take 1 tablet (2.5 mg total) by mouth daily.    Dispense:  90 tablet    Refill:  1   insulin aspart (NOVOLOG) 100 UNIT/ML injection    Sig: Inject 0-12 Units into the skin 3 (three) times daily before meals.For blood sugars 0-150 give 0 units of insulin, 151-200 give 2 units of insulin, 201-250 give 4 units, 251-300 give 6 units, 301-350 give 8 units, 351-400 give 10 units,> 400 give 12 units and call M.D.    Dispense:  10 mL    Refill:  PRN    For blood sugars 0-150 give 0 units of insulin, 151-200 give 2 units of insulin, 201-250 give 4 units, 251-300 give 6 units, 301-350 give 8 units, 351-400 give 10 units,> 400 give 12 units and call M.D.    Follow-up: Return in about 1 week (around 03/25/2023) for nurse visit, BP check, Medical conditions with PCP, in 3 months.       Hoy Register, MD, FAAFP. [facility] [place], [place] [phone]   03/18/2023, 12:06 PM

## 2023-03-19 NOTE — Progress Notes (Signed)
Patient was a no-show for today's scheduled appointment

## 2023-03-24 ENCOUNTER — Other Ambulatory Visit: Payer: Self-pay

## 2023-06-26 ENCOUNTER — Other Ambulatory Visit: Payer: Self-pay | Admitting: Physician Assistant

## 2023-06-26 ENCOUNTER — Other Ambulatory Visit: Payer: Self-pay

## 2023-06-26 MED ORDER — PANTOPRAZOLE SODIUM 40 MG PO TBEC
40.0000 mg | DELAYED_RELEASE_TABLET | Freq: Two times a day (BID) | ORAL | 0 refills | Status: DC
  Filled 2023-06-26: qty 60, 30d supply, fill #0

## 2023-06-26 NOTE — Telephone Encounter (Signed)
Pt's daughter called claiming that PCP has filled this prescription for years and that GI only did it the last time as a one time occurrence

## 2023-08-01 ENCOUNTER — Other Ambulatory Visit: Payer: Self-pay

## 2023-10-14 ENCOUNTER — Ambulatory Visit: Payer: Medicare Other | Attending: Family Medicine

## 2023-10-14 VITALS — Ht 63.0 in | Wt 149.0 lb

## 2023-10-14 DIAGNOSIS — Z0000 Encounter for general adult medical examination without abnormal findings: Secondary | ICD-10-CM

## 2023-10-14 NOTE — Patient Instructions (Addendum)
 Ms. Heber , Thank you for taking time to come for your Medicare Wellness Visit. I appreciate your ongoing commitment to your health goals. Please review the following plan we discussed and let me know if I can assist you in the future.   Referrals/Orders/Follow-Ups/Clinician Recommendations: Keep maintaining your health by keeping your appointments with Dr. Newlin and any specialists that you may see.  Call us  if you need anything.  Have a great year!!!!  This is a list of the screening recommended for you and due dates:  Health Maintenance  Topic Date Due   Zoster (Shingles) Vaccine (1 of 2) Never done   Eye exam for diabetics  06/04/2022   COVID-19 Vaccine (4 - 2024-25 season) 03/02/2023   Hemoglobin A1C  09/15/2023   DEXA scan (bone density measurement)  12/12/2023*   Complete foot exam   12/12/2023   Flu Shot  01/30/2024   Yearly kidney function blood test for diabetes  03/17/2024   Yearly kidney health urinalysis for diabetes  03/17/2024   Medicare Annual Wellness Visit  10/13/2024   Colon Cancer Screening  11/04/2032   Pneumonia Vaccine  Completed   Hepatitis C Screening  Completed   HPV Vaccine  Aged Out   Meningitis B Vaccine  Aged Out   DTaP/Tdap/Td vaccine  Discontinued  *Topic was postponed. The date shown is not the original due date.    Advanced directives: (Declined) Advance directive discussed with you today. Even though you declined this today, please call our office should you change your mind, and we can give you the proper paperwork for you to fill out.  Next Medicare Annual Wellness Visit scheduled for next year: Yes

## 2023-10-14 NOTE — Progress Notes (Signed)
 Because this visit was a virtual/telehealth visit,  certain criteria was not obtained, such a blood pressure, CBG if applicable, and timed get up and go. Any medications not marked as "taking" were not mentioned during the medication reconciliation part of the visit. Any vitals not documented were not able to be obtained due to this being a telehealth visit or patient was unable to self-report a recent blood pressure reading due to a lack of equipment at home via telehealth. Vitals that have been documented are verbally provided by the patient.   Subjective:   Tina Phelps is a 78 y.o. who presents for a Medicare Wellness preventive visit.  Visit Complete: Virtual I connected with  Tina Phelps on 10/14/23 by a audio enabled telemedicine application and verified that I am speaking with the correct person using two identifiers.  Patient Location: Home  Provider Location: Office/Clinic  I discussed the limitations of evaluation and management by telemedicine. The patient expressed understanding and agreed to proceed.  Vital Signs: Because this visit was a virtual/telehealth visit, some criteria may be missing or patient reported. Any vitals not documented were not able to be obtained and vitals that have been documented are patient reported.  VideoDeclined- This patient declined Librarian, academic. Therefore the visit was completed with audio only.  Persons Participating in Visit:  Daughter Tina Phelps) and patient was present during visit.  AWV Questionnaire: No: Patient Medicare AWV questionnaire was not completed prior to this visit.  Cardiac Risk Factors include: advanced age (>58men, >12 women);dyslipidemia;hypertension;diabetes mellitus     Objective:    Today's Vitals   10/14/23 1412  Weight: 149 lb (67.6 kg)  Height: 5\' 3"  (1.6 m)  PainSc: 0-No pain   Body mass index is 26.39 kg/m.     10/14/2023    2:15 PM 10/01/2022    1:41 PM 08/06/2022   12:56 AM  08/05/2022    3:33 PM 12/21/2019    2:35 PM 11/15/2016   10:43 AM 01/26/2016    3:23 PM  Advanced Directives  Does Patient Have a Medical Advance Directive? No No  No No No No  Would patient like information on creating a medical advance directive? No - Patient declined  No - Patient declined    No - patient declined information    Current Medications (verified) Outpatient Encounter Medications as of 10/14/2023  Medication Sig   Accu-Chek Softclix Lancets lancets Use to check blood sugar 3 times daily.   amLODipine (NORVASC) 2.5 MG tablet Take 1 tablet (2.5 mg total) by mouth daily.   Bismuth 262 MG CHEW Chew 524 mg by mouth in the morning, at noon, in the evening, and at bedtime.   Blood Glucose Monitoring Suppl (ACCU-CHEK GUIDE) w/Device KIT Use to check blood sugar 3 times daily. E11.69   brimonidine (ALPHAGAN) 0.2 % ophthalmic solution Place 1 drop into both eyes 2 (two) times daily.   chlorthalidone (HYGROTON) 50 MG tablet Take 1 tablet (50 mg total) by mouth daily.   ciclopirox (PENLAC) 8 % solution Apply topically at bedtime. Apply over nail and surrounding skin. Apply daily over previous coat. After seven (7) days, may remove with alcohol and continue cycle.   Continuous Glucose Receiver (DEXCOM G7 RECEIVER) DEVI Use to check blood sugar continuously throughout the day. Change sensors once every 10 days.   Continuous Glucose Sensor (DEXCOM G7 SENSOR) MISC Use to check blood sugar continuously throughout the day. Change sensors once every 10 days.   Continuous Glucose Transmitter (  DEXCOM G6 TRANSMITTER) MISC Use to check blood sugar three times daily. Change transmitter once every 90 days. E11.65   diclofenac sodium (VOLTAREN) 1 % GEL Apply 2 g topically 4 (four) times daily. (Patient taking differently: Apply 2 g topically daily as needed (for shoulder pain).)   dorzolamide-timolol (COSOPT) 2-0.5 % ophthalmic solution Place 1 drop into both eyes 2 (two) times daily.   doxycycline  (VIBRAMYCIN) 100 MG capsule Take 1 capsule (100 mg total) by mouth 2 (two) times daily.   gabapentin (NEURONTIN) 300 MG capsule Take 1 capsule (300 mg total) by mouth at bedtime.   glucose blood (ACCU-CHEK GUIDE) test strip Use to check blood sugar 3 times daily.   insulin aspart (NOVOLOG FLEXPEN) 100 UNIT/ML FlexPen For blood sugars 0-150 give 0 units of insulin, 151-200 give 2 units of insulin, 201-250 give 4 units, 251-300 give 6 units, 301-350 give 8 units, 351-400 give 10 units,> 400 give 12 units and call M.D.   Insulin Glargine Solostar (LANTUS) 100 UNIT/ML Solostar Pen Inject 32 Units into the skin daily.   Insulin Pen Needle (B-D UF III MINI PEN NEEDLES) 31G X 5 MM MISC USE AS DIRECTED   latanoprost (XALATAN) 0.005 % ophthalmic solution Instill 1 drop into both eyes every evening   latanoprost (XALATAN) 0.005 % ophthalmic solution Place 1 drop into both eyes every evening.   lisinopril (ZESTRIL) 20 MG tablet Take 1 tablet (20 mg total) by mouth daily.   metFORMIN (GLUCOPHAGE-XR) 500 MG 24 hr tablet Take 2 tablets (1,000 mg total) by mouth daily with breakfast.   metroNIDAZOLE (FLAGYL) 500 MG tablet Take 1 tablet (500 mg total) by mouth 2 (two) times daily.   MURO 128 5 % ophthalmic ointment Place 1 Application into both eyes every evening.   ondansetron (ZOFRAN-ODT) 8 MG disintegrating tablet Place 1 tablet twice a day by translingual route as needed for 3 days.   pantoprazole (PROTONIX) 40 MG tablet Take 1 tablet (40 mg total) by mouth daily.   pantoprazole (PROTONIX) 40 MG tablet Take 1 tablet (40 mg total) by mouth 2 (two) times daily.   rosuvastatin (CRESTOR) 20 MG tablet Take 1 tablet (20 mg total) by mouth daily.   sucralfate (CARAFATE) 1 g tablet Take 1 tablet (1 g total) by mouth 4 (four) times daily for 28 days.   No facility-administered encounter medications on file as of 10/14/2023.    Allergies (verified) Pork-derived products   History: Past Medical History:  Diagnosis  Date   Arthritis    Diabetes mellitus without complication (HCC)    Glaucoma    Hypertension    Past Surgical History:  Procedure Laterality Date   CHOLECYSTECTOMY     NEPHRECTOMY Right    OOPHORECTOMY Right    Family History  Problem Relation Age of Onset   Diabetes Sister    Diabetes Brother    Diabetes Daughter    Social History   Socioeconomic History   Marital status: Single    Spouse name: Not on file   Number of children: Not on file   Years of education: Not on file   Highest education level: Not on file  Occupational History   Not on file  Tobacco Use   Smoking status: Never   Smokeless tobacco: Never  Vaping Use   Vaping status: Never Used  Substance and Sexual Activity   Alcohol use: No   Drug use: No   Sexual activity: Not Currently  Other Topics Concern   Not  on file  Social History Narrative   Not on file   Social Drivers of Health   Financial Resource Strain: Low Risk  (10/14/2023)   Overall Financial Resource Strain (CARDIA)    Difficulty of Paying Living Expenses: Not hard at all  Food Insecurity: No Food Insecurity (10/14/2023)   Hunger Vital Sign    Worried About Running Out of Food in the Last Year: Never true    Ran Out of Food in the Last Year: Never true  Transportation Needs: No Transportation Needs (10/14/2023)   PRAPARE - Administrator, Civil Service (Medical): No    Lack of Transportation (Non-Medical): No  Physical Activity: Sufficiently Active (10/14/2023)   Exercise Vital Sign    Days of Exercise per Week: 5 days    Minutes of Exercise per Session: 30 min  Stress: No Stress Concern Present (10/14/2023)   [employer] of Occupational Health - Occupational Stress Questionnaire    Feeling of Stress : Not at all  Social Connections: Moderately Isolated (10/14/2023)   Social Connection and Isolation Panel [NHANES]    Frequency of Communication with Friends and Family: More than three times a week    Frequency of  Social Gatherings with Friends and Family: More than three times a week    Attends Religious Services: Never    Database administrator or Organizations: Yes    Attends Banker Meetings: Never    Marital Status: Never married    Tobacco Counseling Counseling given: Not Answered    Clinical Intake:  Pre-visit preparation completed: Yes  Pain : No/denies pain Pain Score: 0-No pain     BMI - recorded: 26.39 Nutritional Status: BMI 25 -29 Overweight Nutritional Risks: None Diabetes: Yes CBG done?: No Did pt. bring in CBG monitor from home?: No  Lab Results  Component Value Date   HGBA1C 9.4 (A) 03/18/2023   HGBA1C 7.9 (A) 12/12/2022   HGBA1C 9.5 (H) 08/06/2022     How often do you need to have someone help you when you read instructions, pamphlets, or other written materials from your doctor or pharmacy?: 1 - Never  Interpreter Needed?: No  Information entered by :: Shamaya Kauer N. Nhan Qualley, LPN.   Activities of Daily Living     10/14/2023    2:15 PM  In your present state of health, do you have any difficulty performing the following activities:  Hearing? 0  Vision? 0  Difficulty concentrating or making decisions? 0  Walking or climbing stairs? 0  Dressing or bathing? 0  Doing errands, shopping? 0  Preparing Food and eating ? N  Using the Toilet? N  In the past six months, have you accidently leaked urine? N  Do you have problems with loss of bowel control? N  Managing your Medications? N  Managing your Finances? N  Housekeeping or managing your Housekeeping? N    Patient Care Team: Hoy Register, MD as PCP - General (Family Medicine) Augustin Schooling, MD as Consulting Physician (Ophthalmology)  Indicate any recent Medical Services you may have received from other than [facility] providers in the past year (date may be approximate).     Assessment:   This is a routine wellness examination for Tina.  Hearing/Vision screen Hearing Screening -  Comments:: Denies hearing difficulties.  Vision Screening - Comments:: Wears rx glasses - up to date with routine eye exams with     Goals Addressed  This Visit's Progress    Patient Stated       10/14/2023: Continue to maintain my health by staying independent and enjoying family.       Depression Screen     10/14/2023    2:17 PM 03/18/2023   10:40 AM 12/12/2022    9:11 AM 10/01/2022    1:42 PM 02/11/2022    2:14 PM 12/27/2021   10:34 AM 06/05/2021   10:23 AM  PHQ 2/9 Scores  PHQ - 2 Score 0 0  0 0 0 0  PHQ- 9 Score 0    0 0 0  Exception Documentation   Patient refusal        Fall Risk     10/14/2023    2:15 PM 03/18/2023   10:40 AM 12/12/2022    9:11 AM 10/01/2022    1:41 PM 12/27/2021   10:28 AM  Fall Risk   Falls in the past year? 0 0 0 0 0  Number falls in past yr: 0 0 0 0 0  Injury with Fall? 0 0 0 0 0  Risk for fall due to : No Fall Risks No Fall Risks No Fall Risks Medication side effect   Follow up Falls prevention discussed;Falls evaluation completed   Falls prevention discussed;Education provided;Falls evaluation completed     MEDICARE RISK AT HOME:  Medicare Risk at Home Any stairs in or around the home?: Yes If so, are there any without handrails?: No Home free of loose throw rugs in walkways, pet beds, electrical cords, etc?: Yes Adequate lighting in your home to reduce risk of falls?: Yes Life alert?: No Use of a cane, walker or w/c?: No Grab bars in the bathroom?: No Shower chair or bench in shower?: No Elevated toilet seat or a handicapped toilet?: No  TIMED UP AND GO:  Was the test performed?  No  Cognitive Function: 6CIT completed    10/14/2023    2:26 PM  MMSE - Mini Mental State Exam  Not completed: Unable to complete        10/14/2023    2:21 PM  6CIT Screen  What Year? 0 points  What month? 0 points  What time? 0 points  Count back from 20 0 points  Months in reverse 0 points  Repeat phrase 0 points  Total Score 0  points    Immunizations Immunization History  Administered Date(s) Administered   Influenza,inj,Quad PF,6+ Mos 05/02/2015, 06/05/2021   PFIZER(Purple Top)SARS-COV-2 Vaccination 12/21/2019, 01/11/2020, 08/20/2020   PNEUMOCOCCAL CONJUGATE-20 06/05/2021   Pneumococcal Conjugate-13 06/07/2020    Screening Tests Health Maintenance  Topic Date Due   Zoster Vaccines- Shingrix (1 of 2) Never done   OPHTHALMOLOGY EXAM  06/04/2022   COVID-19 Vaccine (4 - 2024-25 season) 03/02/2023   HEMOGLOBIN A1C  09/15/2023   DEXA SCAN  12/12/2023 (Originally 02/25/2013)   FOOT EXAM  12/12/2023   INFLUENZA VACCINE  01/30/2024   Diabetic kidney evaluation - eGFR measurement  03/17/2024   Diabetic kidney evaluation - Urine ACR  03/17/2024   Medicare Annual Wellness (AWV)  10/13/2024   Colonoscopy  11/04/2032   Pneumonia Vaccine 48+ Years old  Completed   Hepatitis C Screening  Completed   HPV VACCINES  Aged Out   Meningococcal B Vaccine  Aged Out   DTaP/Tdap/Td  Discontinued    Health Maintenance  Health Maintenance Due  Topic Date Due   Zoster Vaccines- Shingrix (1 of 2) Never done   OPHTHALMOLOGY EXAM  06/04/2022  COVID-19 Vaccine (4 - 2024-25 season) 03/02/2023   HEMOGLOBIN A1C  09/15/2023   Health Maintenance Items Addressed: Yes; Patient is due for Shingrix vaccine, Covid vaccine and HgA1C.  Additional Screening:  Vision Screening: Recommended annual ophthalmology exams for early detection of glaucoma and other disorders of the eye.  Dental Screening: Recommended annual dental exams for proper oral hygiene  Community Resource Referral / Chronic Care Management: CRR required this visit?  No   CCM required this visit?  No     Plan:     I have personally reviewed and noted the following in the patient's chart:   Medical and social history Use of alcohol, tobacco or illicit drugs  Current medications and supplements including opioid prescriptions. Patient is not currently  taking opioid prescriptions. Functional ability and status Nutritional status Physical activity Advanced directives List of other physicians Hospitalizations, surgeries, and ER visits in previous 12 months Vitals Screenings to include cognitive, depression, and falls Referrals and appointments  In addition, I have reviewed and discussed with patient certain preventive protocols, quality metrics, and best practice recommendations. A written personalized care plan for preventive services as well as general preventive health recommendations were provided to patient.     Margette Sheldon, LPN   9/60/4540   After Visit Summary: (MyChart) Due to this being a telephonic visit, the after visit summary with patients personalized plan was offered to patient via MyChart   Notes: Please refer to Routing Comments.

## 2023-10-23 ENCOUNTER — Other Ambulatory Visit: Payer: Self-pay

## 2023-10-23 ENCOUNTER — Other Ambulatory Visit: Payer: Self-pay | Admitting: Family Medicine

## 2023-10-23 DIAGNOSIS — E1169 Type 2 diabetes mellitus with other specified complication: Secondary | ICD-10-CM

## 2023-10-23 NOTE — Telephone Encounter (Unsigned)
 Copied from CRM [id number]. Topic: Clinical - Medication Refill >> Oct 23, 2023 12:11 PM Rosaria Common wrote: Most Recent Primary Care Visit:  Provider: Margette Sheldon  Department: [facility]  Visit Type: MEDICARE AWV, SEQUENTIAL  Date: 10/14/2023  Medication: metFORMIN  (GLUCOPHAGE -XR) 500 MG 24 hr tablet, her stomach, bp, and cholorestral medication  Has the patient contacted their pharmacy? No (Agent: If no, request that the patient contact the pharmacy for the refill. If patient does not wish to contact the pharmacy document the reason why and proceed with request.) (Agent: If yes, when and what did the pharmacy advise?)  Is this the correct pharmacy for this prescription? Yes If no, delete pharmacy and type the correct one.  This is the patient's preferred pharmacy:  [facility] [address], [address] [place] [address] Phone: [phone] Fax: [phone]     Has the prescription been filled recently? Yes  Is the patient out of the medication? Yes  Has the patient been seen for an appointment in the last year OR does the patient have an upcoming appointment? Yes  Can we respond through MyChart? Yes  Agent: Please be advised that Rx refills may take up to 3 business days. We ask that you follow-up with your pharmacy.

## 2023-10-24 ENCOUNTER — Other Ambulatory Visit: Payer: Self-pay

## 2023-10-24 MED ORDER — METFORMIN HCL ER 500 MG PO TB24
1000.0000 mg | ORAL_TABLET | Freq: Every day | ORAL | 0 refills | Status: DC
  Filled 2023-10-24: qty 60, 30d supply, fill #0

## 2023-10-24 NOTE — Telephone Encounter (Signed)
 Requested medication (s) are due for refill today: yes  Requested medication (s) are on the active medication list: yes  Last refill:  03/18/23  Future visit scheduled: no  Notes to clinic:  Unable to refill per protocol due to failed labs, no updated results.      Requested Prescriptions  Pending Prescriptions Disp Refills   metFORMIN  (GLUCOPHAGE -XR) 500 MG 24 hr tablet 180 tablet 1    Sig: Take 2 tablets (1,000 mg total) by mouth daily with breakfast.     Endocrinology:  Diabetes - Biguanides Failed - 10/24/2023 11:03 AM      Failed - Cr in normal range and within 360 days    Creat  Date Value Ref Range Status  09/30/2014 0.81 0.50 - 1.10 mg/dL Final   Creatinine, Ser  Date Value Ref Range Status  03/18/2023 1.10 (H) 0.57 - 1.00 mg/dL Final   Creatinine, Urine  Date Value Ref Range Status  05/02/2015 48 20 - 320 mg/dL Final    Comment:    ** Please note change in reference range(s). **            Failed - HBA1C is between 0 and 7.9 and within 180 days    HbA1c, POC (controlled diabetic range)  Date Value Ref Range Status  03/18/2023 9.4 (A) 0.0 - 7.0 % Final         Failed - eGFR in normal range and within 360 days    GFR, Est African American  Date Value Ref Range Status  09/30/2014 88 mL/min Final   GFR calc Af Amer  Date Value Ref Range Status  06/07/2020 68 >59 mL/min/1.73 Final    Comment:    **In accordance with recommendations from the NKF-ASN Task force,**   [facility] is in the process of updating its eGFR calculation to the   2021 CKD-EPI creatinine equation that estimates kidney function   without a race variable.    GFR, Est Non African American  Date Value Ref Range Status  09/30/2014 76 mL/min Final    Comment:      The estimated GFR is a calculation valid for adults (>=49 years old) that uses the CKD-EPI algorithm to adjust for age and sex. It is   not to be used for children, pregnant women, hospitalized patients,    patients on  dialysis, or with rapidly changing kidney function. According to the NKDEP, eGFR >89 is normal, 60-89 shows mild impairment, 30-59 shows moderate impairment, 15-29 shows severe impairment and <15 is ESRD.      GFR, Estimated  Date Value Ref Range Status  08/08/2022 >60 >60 mL/min Final    Comment:    (NOTE) Calculated using the CKD-EPI Creatinine Equation (2021)    eGFR  Date Value Ref Range Status  03/18/2023 52 (L) >59 mL/min/1.73 Final         Failed - B12 Level in normal range and within 720 days    No results found for: "VITAMINB12"       Failed - CBC within normal limits and completed in the last 12 months    WBC  Date Value Ref Range Status  09/12/2022 8.1 4.0 - 10.5 K/uL Final   RBC  Date Value Ref Range Status  09/12/2022 4.34 3.87 - 5.11 Mil/uL Final   Hemoglobin  Date Value Ref Range Status  09/12/2022 12.5 12.0 - 15.0 g/dL Final  09/81/1914 78.2 11.1 - 15.9 g/dL Final   HCT  Date Value Ref Range Status  09/12/2022 37.8 36.0 - 46.0 % Final   Hematocrit  Date Value Ref Range Status  03/01/2021 38.2 34.0 - 46.6 % Final   MCHC  Date Value Ref Range Status  09/12/2022 33.0 30.0 - 36.0 g/dL Final   [facility]  Date Value Ref Range Status  08/08/2022 28.7 26.0 - 34.0 pg Final   MCV  Date Value Ref Range Status  09/12/2022 86.9 78.0 - 100.0 fl Final  03/01/2021 89 79 - 97 fL Final   No results found for: "PLTCOUNTKUC", "LABPLAT", "POCPLA" RDW  Date Value Ref Range Status  09/12/2022 13.9 11.5 - 15.5 % Final  03/01/2021 12.1 11.7 - 15.4 % Final         Passed - Valid encounter within last 6 months    Recent Outpatient Visits           7 months ago Type 2 diabetes mellitus with other specified complication, with long-term current use of insulin  (HCC)   [facility] [place] Joaquin Mulberry, MD   10 months ago Type 2 diabetes mellitus with other specified complication, with long-term current use of  insulin  [facility])   [facility] [place]. [facility] Joaquin Mulberry, MD   1 year ago Duodenitis   [facility] [place]. [facility] Joaquin Mulberry, MD   1 year ago Type 2 diabetes mellitus with other specified complication, with long-term current use of insulin  [facility])   [facility] [place]. [facility] Joaquin Mulberry, MD   1 year ago Onychomycosis   [facility] Of . [facility] Joaquin Mulberry, MD

## 2023-10-28 ENCOUNTER — Other Ambulatory Visit: Payer: Self-pay

## 2023-10-31 ENCOUNTER — Telehealth: Payer: Self-pay

## 2023-10-31 NOTE — Telephone Encounter (Signed)
 Patient was identified as falling into the [place] Measure - Diabetes.   Patient was: Left voicemail to schedule with primary care provider.

## 2023-11-03 ENCOUNTER — Other Ambulatory Visit: Payer: Self-pay | Admitting: Pharmacist

## 2023-11-03 ENCOUNTER — Other Ambulatory Visit: Payer: Self-pay

## 2023-11-03 MED ORDER — INSULIN GLARGINE-YFGN 100 UNIT/ML ~~LOC~~ SOPN
32.0000 [IU] | PEN_INJECTOR | Freq: Every day | SUBCUTANEOUS | 0 refills | Status: DC
  Filled 2023-11-03: qty 9, 28d supply, fill #0

## 2023-11-05 ENCOUNTER — Other Ambulatory Visit: Payer: Self-pay | Admitting: Family Medicine

## 2023-11-05 ENCOUNTER — Other Ambulatory Visit: Payer: Self-pay

## 2023-11-05 NOTE — Telephone Encounter (Signed)
 Copied from CRM [id number]. Topic: Clinical - Medication Refill >> Nov 05, 2023  1:23 PM Everette C wrote: Medication: Rx #: [id number]  pantoprazole  (PROTONIX ) 40 MG tablet [[id number]]    Has the patient contacted their pharmacy? Yes (Agent: If no, request that the patient contact the pharmacy for the refill. If patient does not wish to contact the pharmacy document the reason why and proceed with request.) (Agent: If yes, when and what did the pharmacy advise?)  This is the patient's preferred pharmacy:  [facility] [address], [address] [place] [address] Phone: [phone] Fax: [phone]  Is this the correct pharmacy for this prescription? Yes If no, delete pharmacy and type the correct one.   Has the prescription been filled recently? No  Is the patient out of the medication? Yes  Has the patient been seen for an appointment in the last year OR does the patient have an upcoming appointment? Yes  Can we respond through MyChart? No  Agent: Please be advised that Rx refills may take up to 3 business days. We ask that you follow-up with your pharmacy.

## 2023-11-06 ENCOUNTER — Other Ambulatory Visit: Payer: Self-pay

## 2023-11-08 ENCOUNTER — Observation Stay (HOSPITAL_COMMUNITY)

## 2023-11-08 ENCOUNTER — Observation Stay (HOSPITAL_COMMUNITY)
Admission: EM | Admit: 2023-11-08 | Discharge: 2023-11-09 | Disposition: A | Discharge status: Home / Self Care | Attending: Internal Medicine | Admitting: Internal Medicine

## 2023-11-08 ENCOUNTER — Emergency Department (HOSPITAL_COMMUNITY)

## 2023-11-08 ENCOUNTER — Encounter (HOSPITAL_COMMUNITY): Payer: Self-pay | Admitting: Emergency Medicine

## 2023-11-08 ENCOUNTER — Other Ambulatory Visit: Payer: Self-pay

## 2023-11-08 DIAGNOSIS — Z79899 Other long term (current) drug therapy: Secondary | ICD-10-CM | POA: Diagnosis not present

## 2023-11-08 DIAGNOSIS — E785 Hyperlipidemia, unspecified: Secondary | ICD-10-CM | POA: Insufficient documentation

## 2023-11-08 DIAGNOSIS — D72829 Elevated white blood cell count, unspecified: Secondary | ICD-10-CM | POA: Insufficient documentation

## 2023-11-08 DIAGNOSIS — K529 Noninfective gastroenteritis and colitis, unspecified: Secondary | ICD-10-CM | POA: Diagnosis not present

## 2023-11-08 DIAGNOSIS — Z794 Long term (current) use of insulin: Secondary | ICD-10-CM | POA: Diagnosis not present

## 2023-11-08 DIAGNOSIS — I10 Essential (primary) hypertension: Secondary | ICD-10-CM | POA: Diagnosis not present

## 2023-11-08 DIAGNOSIS — N179 Acute kidney failure, unspecified: Secondary | ICD-10-CM | POA: Diagnosis not present

## 2023-11-08 DIAGNOSIS — K2980 Duodenitis without bleeding: Secondary | ICD-10-CM | POA: Insufficient documentation

## 2023-11-08 DIAGNOSIS — E1165 Type 2 diabetes mellitus with hyperglycemia: Secondary | ICD-10-CM | POA: Diagnosis not present

## 2023-11-08 DIAGNOSIS — Z9049 Acquired absence of other specified parts of digestive tract: Secondary | ICD-10-CM | POA: Diagnosis not present

## 2023-11-08 DIAGNOSIS — Q600 Renal agenesis, unilateral: Secondary | ICD-10-CM | POA: Diagnosis not present

## 2023-11-08 DIAGNOSIS — K219 Gastro-esophageal reflux disease without esophagitis: Secondary | ICD-10-CM | POA: Insufficient documentation

## 2023-11-08 DIAGNOSIS — R112 Nausea with vomiting, unspecified: Secondary | ICD-10-CM | POA: Diagnosis present

## 2023-11-08 DIAGNOSIS — E876 Hypokalemia: Secondary | ICD-10-CM | POA: Diagnosis not present

## 2023-11-08 DIAGNOSIS — E8729 Other acidosis: Secondary | ICD-10-CM

## 2023-11-08 DIAGNOSIS — E1159 Type 2 diabetes mellitus with other circulatory complications: Secondary | ICD-10-CM

## 2023-11-08 DIAGNOSIS — E1169 Type 2 diabetes mellitus with other specified complication: Secondary | ICD-10-CM

## 2023-11-08 LAB — URINALYSIS, ROUTINE W REFLEX MICROSCOPIC
Bilirubin Urine: NEGATIVE
Bilirubin Urine: NEGATIVE
Glucose, UA: 150 mg/dL — AB
Glucose, UA: 50 mg/dL — AB
Hgb urine dipstick: NEGATIVE
Ketones, ur: 5 mg/dL — AB
Ketones, ur: NEGATIVE mg/dL
Nitrite: NEGATIVE
Nitrite: NEGATIVE
Protein, ur: 30 mg/dL — AB
Protein, ur: NEGATIVE mg/dL
Specific Gravity, Urine: 1.016 (ref 1.005–1.030)
Specific Gravity, Urine: >1.046 — ABNORMAL HIGH (ref 1.005–1.030)
WBC, UA: >50 WBC/hpf (ref 0–5)
pH: 5 (ref 5.0–8.0)
pH: 5 (ref 5.0–8.0)

## 2023-11-08 LAB — CBC
HCT: 46.3 % — ABNORMAL HIGH (ref 36.0–46.0)
Hemoglobin: 15 g/dL (ref 12.0–15.0)
MCH: 29 pg (ref 26.0–34.0)
MCHC: 32.4 g/dL (ref 30.0–36.0)
MCV: 89.6 fL (ref 80.0–100.0)
Platelets: 281 10*3/uL (ref 150–400)
RBC: 5.17 MIL/uL — ABNORMAL HIGH (ref 3.87–5.11)
RDW: 12.7 % (ref 11.5–15.5)
WBC: 12.6 10*3/uL — ABNORMAL HIGH (ref 4.0–10.5)
nRBC: 0 % (ref 0.0–0.2)

## 2023-11-08 LAB — BLOOD GAS, VENOUS
Acid-base deficit: 4.1 mmol/L — ABNORMAL HIGH (ref 0.0–2.0)
Bicarbonate: 22.2 mmol/L (ref 20.0–28.0)
O2 Saturation: 36 %
Patient temperature: 36.7
pCO2, Ven: 43 mmHg — ABNORMAL LOW (ref 44–60)
pH, Ven: 7.31 (ref 7.25–7.43)
pO2, Ven: <31 mmHg — CL (ref 32–45)

## 2023-11-08 LAB — COMPREHENSIVE METABOLIC PANEL WITH GFR
ALT: 16 U/L (ref 0–44)
AST: 22 U/L (ref 15–41)
Albumin: 3.9 g/dL (ref 3.5–5.0)
Alkaline Phosphatase: 91 U/L (ref 38–126)
Anion gap: 16 — ABNORMAL HIGH (ref 5–15)
BUN: 51 mg/dL — ABNORMAL HIGH (ref 8–23)
CO2: 20 mmol/L — ABNORMAL LOW (ref 22–32)
Calcium: 9.2 mg/dL (ref 8.9–10.3)
Chloride: 99 mmol/L (ref 98–111)
Creatinine, Ser: 1.72 mg/dL — ABNORMAL HIGH (ref 0.44–1.00)
GFR, Estimated: 31 mL/min — ABNORMAL LOW (ref >60)
Glucose, Bld: 323 mg/dL — ABNORMAL HIGH (ref 70–99)
Potassium: 3.5 mmol/L (ref 3.5–5.1)
Sodium: 135 mmol/L (ref 135–145)
Total Bilirubin: 0.9 mg/dL (ref 0.0–1.2)
Total Protein: 7.8 g/dL (ref 6.5–8.1)

## 2023-11-08 LAB — BETA-HYDROXYBUTYRIC ACID: Beta-Hydroxybutyric Acid: 0.28 mmol/L — ABNORMAL HIGH (ref 0.05–0.27)

## 2023-11-08 LAB — LIPASE, BLOOD: Lipase: 48 U/L (ref 11–51)

## 2023-11-08 LAB — GLUCOSE, CAPILLARY
Glucose-Capillary: 183 mg/dL — ABNORMAL HIGH (ref 70–99)
Glucose-Capillary: 81 mg/dL (ref 70–99)

## 2023-11-08 MED ORDER — ACETAMINOPHEN 325 MG PO TABS
650.0000 mg | ORAL_TABLET | Freq: Four times a day (QID) | ORAL | Status: DC | PRN
  Administered 2023-11-08: 650 mg via ORAL
  Filled 2023-11-08: qty 2

## 2023-11-08 MED ORDER — ROSUVASTATIN CALCIUM 20 MG PO TABS
20.0000 mg | ORAL_TABLET | Freq: Every day | ORAL | Status: DC
  Administered 2023-11-09: 20 mg via ORAL
  Filled 2023-11-08 (×2): qty 1

## 2023-11-08 MED ORDER — IOHEXOL 350 MG/ML SOLN
60.0000 mL | Freq: Once | INTRAVENOUS | Status: AC | PRN
Start: 2023-11-08 — End: 2023-11-08
  Administered 2023-11-08: 60 mL via INTRAVENOUS

## 2023-11-08 MED ORDER — INSULIN GLARGINE-YFGN 100 UNIT/ML ~~LOC~~ SOLN
16.0000 [IU] | Freq: Every day | SUBCUTANEOUS | Status: DC
  Filled 2023-11-08: qty 0.16

## 2023-11-08 MED ORDER — GABAPENTIN 300 MG PO CAPS
300.0000 mg | ORAL_CAPSULE | Freq: Every day | ORAL | Status: DC
  Administered 2023-11-08: 300 mg via ORAL
  Filled 2023-11-08: qty 1

## 2023-11-08 MED ORDER — PANTOPRAZOLE SODIUM 40 MG IV SOLR: 40.0000 mg | INTRAVENOUS | Status: DC

## 2023-11-08 MED ORDER — ONDANSETRON HCL 4 MG PO TABS: 4.0000 mg | ORAL_TABLET | Freq: Four times a day (QID) | ORAL | Status: DC | PRN

## 2023-11-08 MED ORDER — ONDANSETRON HCL 4 MG/2ML IJ SOLN
4.0000 mg | Freq: Once | INTRAMUSCULAR | Status: AC
  Administered 2023-11-08: 4 mg via INTRAVENOUS
  Filled 2023-11-08: qty 2

## 2023-11-08 MED ORDER — RIVAROXABAN 10 MG PO TABS
10.0000 mg | ORAL_TABLET | Freq: Every day | ORAL | Status: DC
  Administered 2023-11-08: 10 mg via ORAL
  Filled 2023-11-08 (×2): qty 1

## 2023-11-08 MED ORDER — ACETAMINOPHEN 650 MG RE SUPP: 650.0000 mg | Freq: Four times a day (QID) | RECTAL | Status: DC | PRN

## 2023-11-08 MED ORDER — ONDANSETRON HCL 4 MG/2ML IJ SOLN
4.0000 mg | Freq: Four times a day (QID) | INTRAMUSCULAR | Status: DC | PRN
  Administered 2023-11-08: 4 mg via INTRAVENOUS
  Filled 2023-11-08: qty 2

## 2023-11-08 MED ORDER — PANTOPRAZOLE SODIUM 40 MG IV SOLR
40.0000 mg | Freq: Once | INTRAVENOUS | Status: AC
  Administered 2023-11-08: 40 mg via INTRAVENOUS
  Filled 2023-11-08: qty 10

## 2023-11-08 MED ORDER — SODIUM CHLORIDE 0.9 % IV BOLUS
1000.0000 mL | Freq: Once | INTRAVENOUS | Status: AC
  Administered 2023-11-08: 1000 mL via INTRAVENOUS

## 2023-11-08 MED ORDER — PANTOPRAZOLE SODIUM 40 MG IV SOLR: 40.0000 mg | Freq: Two times a day (BID) | INTRAVENOUS | Status: DC

## 2023-11-08 MED ORDER — SODIUM CHLORIDE 0.9 % IV SOLN: INTRAVENOUS | Status: AC

## 2023-11-08 MED ORDER — INSULIN ASPART 100 UNIT/ML IJ SOLN
0.0000 [IU] | Freq: Three times a day (TID) | INTRAMUSCULAR | Status: DC
Start: 2023-11-08 — End: 2023-11-09
  Administered 2023-11-08 – 2023-11-09 (×2): 3 [IU] via SUBCUTANEOUS

## 2023-11-08 MED ORDER — INSULIN GLARGINE-YFGN 100 UNIT/ML ~~LOC~~ SOLN
25.0000 [IU] | Freq: Every day | SUBCUTANEOUS | Status: DC
  Administered 2023-11-08: 25 [IU] via SUBCUTANEOUS
  Filled 2023-11-08 (×3): qty 0.25

## 2023-11-08 NOTE — ED Notes (Signed)
 Pt aware we need a urine sample, provided with specimen cup. Unable to urinate at this time

## 2023-11-08 NOTE — Progress Notes (Addendum)
 Date and time results received: 11/08/23 1442 (use smartphrase ".now" to insert current time)  Test: PO2 Critical Value: less than 31  Name of Provider Notified: Jinwala,MD  Orders Received? Or Actions Taken?: Awaiting orders.

## 2023-11-08 NOTE — Plan of Care (Signed)
   Problem: Coping: Goal: Ability to adjust to condition or change in health will improve Outcome: Progressing

## 2023-11-08 NOTE — ED Triage Notes (Signed)
 Pt arrives via POV from home with nausea and abdominal pain. Denies recent fevers. Per daughter has a known ulcer. VSS.

## 2023-11-08 NOTE — ED Provider Notes (Signed)
 [facility] Provider Note   CSN: [id number] Arrival date & time: 11/08/23  9147     History  Chief Complaint  Patient presents with   Abdominal Pain   Nausea    Tina Phelps is a 78 y.o. female history of duodenitis, diabetes, hypertension, duodenal ulcer presented for nausea and vomiting and diarrhea for 4 days.  Patient was offered a Nurse, learning disability however wants her daughter to translate.  Daughter states that patient has not come in contact with any sick individuals and has not had any fevers but has not been able to keep down any food or fluids they are concerned about dehydration.  They also believe that patient's ulcer is acting up causing her symptoms.  Patient and daughter both denied melena or bright red blood per rectum or hematochezia.  Patient denies any chest pain shortness of breath.  Patient states the pain is in the upper portion in her epigastric region.  Home Medications Prior to Admission medications   Medication Sig Start Date End Date Taking? Authorizing Provider  Accu-Chek Softclix Lancets lancets Use to check blood sugar 3 times daily. 03/18/23   Newlin, Enobong, MD  amLODipine  (NORVASC ) 2.5 MG tablet Take 1 tablet (2.5 mg total) by mouth daily. 03/18/23   Newlin, Enobong, MD  Bismuth  262 MG CHEW Chew 524 mg by mouth in the morning, at noon, in the evening, and at bedtime. 09/19/22   Edmonia Gottron, PA-C  Blood Glucose Monitoring Suppl (ACCU-CHEK GUIDE) w/Device KIT Use to check blood sugar 3 times daily. E11.69 03/18/23   Newlin, Enobong, MD  brimonidine  (ALPHAGAN ) 0.2 % ophthalmic solution Place 1 drop into both eyes 2 (two) times daily. 12/13/22   Karmen Pa, MD  chlorthalidone  (HYGROTON ) 50 MG tablet Take 1 tablet (50 mg total) by mouth daily. 03/18/23   Newlin, Enobong, MD  ciclopirox  (PENLAC ) 8 % solution Apply topically at bedtime. Apply over nail and surrounding skin. Apply daily over previous coat. After seven (7) days,  may remove with alcohol and continue cycle. 12/21/19   Newlin, Enobong, MD  Continuous Glucose Receiver (DEXCOM G7 RECEIVER) DEVI Use to check blood sugar continuously throughout the day. Change sensors once every 10 days. 03/18/23   Newlin, Enobong, MD  Continuous Glucose Sensor (DEXCOM G7 SENSOR) MISC Use to check blood sugar continuously throughout the day. Change sensors once every 10 days. 03/18/23   Newlin, Enobong, MD  Continuous Glucose Transmitter (DEXCOM G6 TRANSMITTER) MISC Use to check blood sugar three times daily. Change transmitter once every 90 days. E11.65 12/11/22   Newlin, Enobong, MD  diclofenac  sodium (VOLTAREN ) 1 % GEL Apply 2 g topically 4 (four) times daily. Patient taking differently: Apply 2 g topically daily as needed (for shoulder pain). 04/14/18   Newlin, Enobong, MD  dorzolamide -timolol  (COSOPT ) 2-0.5 % ophthalmic solution Place 1 drop into both eyes 2 (two) times daily. 12/13/22   Karmen Pa, MD  doxycycline  (VIBRAMYCIN ) 100 MG capsule Take 1 capsule (100 mg total) by mouth 2 (two) times daily. 09/19/22   Edmonia Gottron, PA-C  gabapentin  (NEURONTIN ) 300 MG capsule Take 1 capsule (300 mg total) by mouth at bedtime. 03/18/23   Newlin, Enobong, MD  glucose blood (ACCU-CHEK GUIDE) test strip Use to check blood sugar 3 times daily. 03/18/23   Newlin, Enobong, MD  insulin  aspart (NOVOLOG  FLEXPEN) 100 UNIT/ML FlexPen For blood sugars 0-150 give 0 units of insulin , 151-200 give 2 units of insulin , 201-250 give 4  units, 251-300 give 6 units, 301-350 give 8 units, 351-400 give 10 units,> 400 give 12 units and call M.D. 03/18/23   Newlin, Enobong, MD  insulin  glargine-yfgn (SEMGLEE ) 100 UNIT/ML Pen Inject 32 Units into the skin daily. 11/03/23   Newlin, Enobong, MD  Insulin  Pen Needle (B-D UF III MINI PEN NEEDLES) 31G X 5 MM MISC USE AS DIRECTED 03/01/21   Hassie Lint, PA-C  latanoprost  (XALATAN ) 0.005 % ophthalmic solution Instill 1 drop into both eyes every evening 06/04/21      latanoprost  (XALATAN ) 0.005 % ophthalmic solution Place 1 drop into both eyes every evening. 12/13/22     lisinopril  (ZESTRIL ) 20 MG tablet Take 1 tablet (20 mg total) by mouth daily. 03/18/23   Newlin, Enobong, MD  metFORMIN  (GLUCOPHAGE -XR) 500 MG 24 hr tablet Take 2 tablets (1,000 mg total) by mouth daily with breakfast. Please schedule PCP appt for more refills! 10/24/23   Newlin, Enobong, MD  metroNIDAZOLE  (FLAGYL ) 500 MG tablet Take 1 tablet (500 mg total) by mouth 2 (two) times daily. 09/19/22   Edmonia Gottron, PA-C  MURO 128 5 % ophthalmic ointment Place 1 Application into both eyes every evening. 11/19/19   [provider]  ondansetron  (ZOFRAN -ODT) 8 MG disintegrating tablet Place 1 tablet twice a day by translingual route as needed for 3 days. 11/03/21     pantoprazole  (PROTONIX ) 40 MG tablet Take 1 tablet (40 mg total) by mouth daily. 08/08/22 02/10/23  Oral Billings, MD  pantoprazole  (PROTONIX ) 40 MG tablet Take 1 tablet (40 mg total) by mouth 2 (two) times daily. 06/26/23   Cirigliano, Vito V, DO  rosuvastatin  (CRESTOR ) 20 MG tablet Take 1 tablet (20 mg total) by mouth daily. 03/18/23   Newlin, Enobong, MD  sucralfate  (CARAFATE ) 1 g tablet Take 1 tablet (1 g total) by mouth 4 (four) times daily for 28 days. 08/08/22 09/05/22  Oral Billings, MD      Allergies    Pork-derived products    Review of Systems   Review of Systems  Gastrointestinal:  Positive for abdominal pain.    Physical Exam Updated Vital Signs BP 134/66   Pulse 81   Temp 98 F (36.7 C) (Oral)   Resp 14   Ht 5\' 3"  (1.6 m)   Wt 67.6 kg   SpO2 100%   BMI 26.39 kg/m  Physical Exam Vitals reviewed.  Constitutional:      General: She is not in acute distress. HENT:     Head: Normocephalic and atraumatic.  Eyes:     Extraocular Movements: Extraocular movements intact.     Conjunctiva/sclera: Conjunctivae normal.     Pupils: Pupils are equal, round, and reactive to light.  Cardiovascular:     Rate and  Rhythm: Normal rate and regular rhythm.     Pulses: Normal pulses.     Heart sounds: Normal heart sounds.     Comments: 2+ bilateral radial/dorsalis pedis pulses with regular rate Pulmonary:     Effort: Pulmonary effort is normal. No respiratory distress.     Breath sounds: Normal breath sounds.  Abdominal:     Palpations: Abdomen is soft.     Tenderness: There is abdominal tenderness in the epigastric area. There is no guarding or rebound. Negative signs include Rovsing's sign, McBurney's sign and psoas sign.  Musculoskeletal:        General: Normal range of motion.     Cervical back: Normal range of motion and neck supple.  Comments: 5 out of 5 bilateral grip/leg extension strength  Skin:    General: Skin is warm and dry.     Capillary Refill: Capillary refill takes less than 2 seconds.  Neurological:     General: No focal deficit present.     Mental Status: She is alert and oriented to person, place, and time.     Comments: Sensation intact in all 4 limbs  Psychiatric:        Mood and Affect: Mood normal.     ED Results / Procedures / Treatments   Labs (all labs ordered are listed, but only abnormal results are displayed) Labs Reviewed  COMPREHENSIVE METABOLIC PANEL WITH GFR - Abnormal; Notable for the following components:      Result Value   CO2 20 (*)    Glucose, Bld 323 (*)    BUN 51 (*)    Creatinine, Ser 1.72 (*)    GFR, Estimated 31 (*)    Anion gap 16 (*)    All other components within normal limits  CBC - Abnormal; Notable for the following components:   WBC 12.6 (*)    RBC 5.17 (*)    HCT 46.3 (*)    All other components within normal limits  URINALYSIS, ROUTINE W REFLEX MICROSCOPIC - Abnormal; Notable for the following components:   APPearance CLOUDY (*)    Glucose, UA 150 (*)    Hgb urine dipstick SMALL (*)    Ketones, ur 5 (*)    Protein, ur 30 (*)    Leukocytes,Ua LARGE (*)    Bacteria, UA MANY (*)    Non Squamous Epithelial 0-5 (*)    All  other components within normal limits  LIPASE, BLOOD  URINALYSIS, ROUTINE W REFLEX MICROSCOPIC  BETA-HYDROXYBUTYRIC ACID    EKG None  Radiology CT ABDOMEN PELVIS W CONTRAST Result Date: 11/08/2023 CLINICAL DATA:  Nausea and vomiting EXAM: CT ABDOMEN AND PELVIS WITH CONTRAST TECHNIQUE: Multidetector CT imaging of the abdomen and pelvis was performed using the standard protocol following bolus administration of intravenous contrast. RADIATION DOSE REDUCTION: This exam was performed according to the departmental dose-optimization program which includes automated exposure control, adjustment of the mA and/or kV according to patient size and/or use of iterative reconstruction technique. CONTRAST:  60mL OMNIPAQUE  IOHEXOL  350 MG/ML SOLN COMPARISON:  08/05/2022 FINDINGS: Lower chest:  No contributory findings. Hepatobiliary: No focal liver abnormality.Mild intrahepatic biliary dilatation after cholecystectomy, non worrisome. Pancreas: Unremarkable. Spleen: Unremarkable. Adrenals/Urinary Tract: Negative adrenals. Right nephrectomy. No hydronephrosis or stone. Patchy hypoenhancement and thinning of left renal cortex attributed to scarring. Small left renal cystic densities. Unremarkable bladder. Stomach/Bowel: No bowel obstruction. Prominent thickness of the midportion duodenum, but less than before and without adjacent fat stranding. Multiple ingested high densities scattered throughout the enteric stream. Vascular/Lymphatic: No acute vascular abnormality. Multifocal atheromatous calcification is unchanged. No mass or adenopathy. Reproductive:No pathologic findings. Other: No ascites or pneumoperitoneum. Musculoskeletal: No acute abnormalities. IMPRESSION: Equivocal for duodenitis, degree less than seen on 08/05/2022 CT. No bowel obstruction. Atherosclerosis. Electronically Signed   By: Ronnette Coke M.D.   On: 11/08/2023 12:32    Procedures .Critical Care  Performed by: Denese Finn, PA-C Authorized  by: Denese Finn, PA-C   Critical care provider statement:    Critical care time (minutes):  40   Critical care time was exclusive of:  Separately billable procedures and treating other patients   Critical care was necessary to treat or prevent imminent or life-threatening deterioration of the following  conditions:  Renal failure and dehydration   Critical care was time spent personally by me on the following activities:  Blood draw for specimens, development of treatment plan with patient or surrogate, discussions with consultants, evaluation of patient's response to treatment, examination of patient, obtaining history from patient or surrogate, review of old charts, re-evaluation of patient's condition, pulse oximetry, ordering and review of radiographic studies, ordering and review of laboratory studies and ordering and performing treatments and interventions   I assumed direction of critical care for this patient from another provider in my specialty: no     Care discussed with: admitting provider       Medications Ordered in ED Medications  pantoprazole  (PROTONIX ) injection 40 mg (40 mg Intravenous Given 11/08/23 0918)  ondansetron  (ZOFRAN ) injection 4 mg (4 mg Intravenous Given 11/08/23 0918)  sodium chloride  0.9 % bolus 1,000 mL (0 mLs Intravenous Stopped 11/08/23 1037)  iohexol  (OMNIPAQUE ) 350 MG/ML injection 60 mL (60 mLs Intravenous Contrast Given 11/08/23 1116)  sodium chloride  0.9 % bolus 1,000 mL (1,000 mLs Intravenous New Bag/Given 11/08/23 1251)    ED Course/ Medical Decision Making/ A&P                                 Medical Decision Making Amount and/or Complexity of Data Reviewed Labs: ordered. Radiology: ordered.  Risk Prescription drug management. Decision regarding hospitalization.   Muslima Scheidt 78 y.o. presented today for abdominal pain.  Working DDx that I considered at this time includes, but not limited to, gastroenteritis, colitis, small bowel obstruction,  appendicitis, cholecystitis, hepatobiliary pathology, gastritis, PUD, ACS, aortic dissection, diverticulosis/diverticulitis, pancreatitis, nephrolithiasis, medication induced, AAA, UTI, pyelonephritis.  R/o DDx: colitis, small bowel obstruction, appendicitis, cholecystitis, hepatobiliary pathology, gastritis, PUD, ACS, aortic dissection, diverticulosis/diverticulitis, pancreatitis, nephrolithiasis, medication induced, AAA, UTI, pyelonephritis: These are considered less likely due to history of present illness, physical exam, labs/imaging findings.  Review of prior external notes: None  Unique Tests and My Independent Interpretation:  CBC with differential: Mild leukocytosis 12.6 CMP: Creatinine 1.72, anion gap 16, GFR 31, hyperglycemia 323 Lipase: Unremarkable UA: Squamous cells present CT Abd/Pelvis with contrast: Duodenitis  Social Determinants of Health: none  Discussion with Independent Historian: Daughter  Discussion of Management of Tests: Jinwala, MD Hospitalist  Risk: High: hospitalization or escalation of hospital-level care  Risk Stratification Score: none   Plan: On exam patient was no acute distress with stable vitals.  Patient does have epigastric tenderness without peritoneal signs on exam.  Patient is not endorsing melena or blood in her stool or hematochezia though necessitate rectal exam at this time however will give Protonix  along with Zofran  and fluids and get a CT scan.  Labs do show AKI and in a patient with 1 kidney do feel she would need to be admitted to give fluids.  I spoke with the hospitalist and they will admit.  This chart was dictated using voice recognition software.  Despite best efforts to proofread,  errors can occur which can change the documentation meaning.        Final Clinical Impression(s) / ED Diagnoses Final diagnoses:  AKI (acute kidney injury) [facility]  Duodenitis    Rx / DC Orders ED Discharge Orders     None          Elex Grimmer 11/08/23 1254    Burnette Carte, MD 11/09/23 0800

## 2023-11-08 NOTE — ED Notes (Signed)
 Patient transported to CT

## 2023-11-08 NOTE — H&P (Addendum)
 History and Physical    Patient: Tina Phelps WRU:[id number] DOB: [birth year] DOA: 11/08/2023 DOS: the patient was seen and examined on 11/08/2023 PCP: Joaquin Mulberry, MD  Patient coming from: Home  Chief Complaint:  Chief Complaint  Patient presents with   Abdominal Pain   Nausea   HPI: Tina Phelps is a 78 y.o. female with medical history significant of uncontrolled T2DM, hx of H pylori duodenitis s/p eradication therapy, HTN, GERD, HLD, solitary kidney presenting to the ED with generalized abdominal pain, N/V, and diarrhea for the past 4-5 days.  Patient reports that she was in her usual state of health until about 4-5 days ago. She began experiencing nausea with nonbilious nonbloody emesis and watery diarrhea.  States that she has been having about 4-5 episodes of watery diarrhea daily without any blood noted. She has also experienced emesis after any PO intake at home and thus has not eaten any food in the past few days and has had minimal fluid intake. Around the time these symptoms began, she also experienced intermittent generalized abdominal discomfort. She reports that she has a history of similar symptoms in the past and has been seen by GI as an outpatient, who recommended PPI therapy. She denies any fevers, chills, chest pain, palpitations, SHOB, urinary changes.  Per chart review, patient did test positive for H pylori in March 2024. She completed eradication therapy with bismuth  subsalicylate, metronidazole , doxycycline , and pantoprazole . She had a follow up endoscopy and colonoscopy in May 2024 with surgical pathology negative for H pylori. Endoscopy was otherwise normal and colonoscopy revealed polyps which were resected and found to be mostly benign with a few that were precancerous. Repeat colonoscopy recommended in 5 years from that time.   ED course: Vital signs stable.  CBC with mild leukocytosis, otherwise unremarkable.  CMP with glucose 323, creatinine 1.72 (up from baseline  of 1-1.1), anion gap 16, bicarbonate 20.  Urinalysis with glucosuria, mild ketonuria, mild hematuria, large leukocytes, many bacteria but was not a clean-catch given 11-20 squamous epithelial cells.  Repeat urinalysis pending.  CT abdomen pelvis with contrast showing findings that are equivocal for duodenitis though to a degree less than that seen in February 2024.  No obstruction noted on CT.  Patient given IV Protonix , 2 L IV fluid bolus, Zofran  with improvement.  TRH asked to evaluate patient for admission.  Review of Systems: As mentioned in the history of present illness. All other systems reviewed and are negative. Past Medical History:  Diagnosis Date   Arthritis    Diabetes mellitus without complication (HCC)    Glaucoma    Hypertension    Past Surgical History:  Procedure Laterality Date   CHOLECYSTECTOMY     NEPHRECTOMY Right    OOPHORECTOMY Right    Social History:  reports that she has never smoked. She has never used smokeless tobacco. She reports that she does not drink alcohol and does not use drugs.  Allergies  Allergen Reactions   Pork-Derived Products     Religious preference- non halal    Family History  Problem Relation Age of Onset   Diabetes Sister    Diabetes Brother    Diabetes Daughter     Prior to Admission medications   Medication Sig Start Date End Date Taking? Authorizing Provider  Accu-Chek Softclix Lancets lancets Use to check blood sugar 3 times daily. 03/18/23   Newlin, Enobong, MD  amLODipine  (NORVASC ) 2.5 MG tablet Take 1 tablet (2.5 mg total) by mouth daily. 03/18/23  Newlin, Enobong, MD  Bismuth  262 MG CHEW Chew 524 mg by mouth in the morning, at noon, in the evening, and at bedtime. 09/19/22   Edmonia Gottron, PA-C  Blood Glucose Monitoring Suppl (ACCU-CHEK GUIDE) w/Device KIT Use to check blood sugar 3 times daily. E11.69 03/18/23   Newlin, Enobong, MD  brimonidine  (ALPHAGAN ) 0.2 % ophthalmic solution Place 1 drop into both eyes 2 (two) times  daily. 12/13/22   Karmen Pa, MD  chlorthalidone  (HYGROTON ) 50 MG tablet Take 1 tablet (50 mg total) by mouth daily. 03/18/23   Joaquin Mulberry, MD  ciclopirox  (PENLAC ) 8 % solution Apply topically at bedtime. Apply over nail and surrounding skin. Apply daily over previous coat. After seven (7) days, may remove with alcohol and continue cycle. 12/21/19   Newlin, Enobong, MD  Continuous Glucose Receiver (DEXCOM G7 RECEIVER) DEVI Use to check blood sugar continuously throughout the day. Change sensors once every 10 days. 03/18/23   Newlin, Enobong, MD  Continuous Glucose Sensor (DEXCOM G7 SENSOR) MISC Use to check blood sugar continuously throughout the day. Change sensors once every 10 days. 03/18/23   Newlin, Enobong, MD  Continuous Glucose Transmitter (DEXCOM G6 TRANSMITTER) MISC Use to check blood sugar three times daily. Change transmitter once every 90 days. E11.65 12/11/22   Newlin, Enobong, MD  diclofenac  sodium (VOLTAREN ) 1 % GEL Apply 2 g topically 4 (four) times daily. Patient taking differently: Apply 2 g topically daily as needed (for shoulder pain). 04/14/18   Newlin, Enobong, MD  dorzolamide -timolol  (COSOPT ) 2-0.5 % ophthalmic solution Place 1 drop into both eyes 2 (two) times daily. 12/13/22   Karmen Pa, MD  doxycycline  (VIBRAMYCIN ) 100 MG capsule Take 1 capsule (100 mg total) by mouth 2 (two) times daily. 09/19/22   Edmonia Gottron, PA-C  gabapentin  (NEURONTIN ) 300 MG capsule Take 1 capsule (300 mg total) by mouth at bedtime. 03/18/23   Newlin, Enobong, MD  glucose blood (ACCU-CHEK GUIDE) test strip Use to check blood sugar 3 times daily. 03/18/23   Newlin, Enobong, MD  insulin  aspart (NOVOLOG  FLEXPEN) 100 UNIT/ML FlexPen For blood sugars 0-150 give 0 units of insulin , 151-200 give 2 units of insulin , 201-250 give 4 units, 251-300 give 6 units, 301-350 give 8 units, 351-400 give 10 units,> 400 give 12 units and call M.D. 03/18/23   Newlin, Enobong, MD  insulin  glargine-yfgn (SEMGLEE ) 100  UNIT/ML Pen Inject 32 Units into the skin daily. 11/03/23   Newlin, Enobong, MD  Insulin  Pen Needle (B-D UF III MINI PEN NEEDLES) 31G X 5 MM MISC USE AS DIRECTED 03/01/21   Hassie Lint, PA-C  latanoprost  (XALATAN ) 0.005 % ophthalmic solution Instill 1 drop into both eyes every evening 06/04/21     latanoprost  (XALATAN ) 0.005 % ophthalmic solution Place 1 drop into both eyes every evening. 12/13/22     lisinopril  (ZESTRIL ) 20 MG tablet Take 1 tablet (20 mg total) by mouth daily. 03/18/23   Newlin, Enobong, MD  metFORMIN  (GLUCOPHAGE -XR) 500 MG 24 hr tablet Take 2 tablets (1,000 mg total) by mouth daily with breakfast. Please schedule PCP appt for more refills! 10/24/23   Newlin, Enobong, MD  metroNIDAZOLE  (FLAGYL ) 500 MG tablet Take 1 tablet (500 mg total) by mouth 2 (two) times daily. 09/19/22   Edmonia Gottron, PA-C  MURO 128 5 % ophthalmic ointment Place 1 Application into both eyes every evening. 11/19/19   [provider]  ondansetron  (ZOFRAN -ODT) 8 MG disintegrating tablet Place 1 tablet twice a day  by translingual route as needed for 3 days. 11/03/21     pantoprazole  (PROTONIX ) 40 MG tablet Take 1 tablet (40 mg total) by mouth daily. 08/08/22 02/10/23  Oral Billings, MD  pantoprazole  (PROTONIX ) 40 MG tablet Take 1 tablet (40 mg total) by mouth 2 (two) times daily. 06/26/23   Cirigliano, Vito V, DO  rosuvastatin  (CRESTOR ) 20 MG tablet Take 1 tablet (20 mg total) by mouth daily. 03/18/23   Newlin, Enobong, MD  sucralfate  (CARAFATE ) 1 g tablet Take 1 tablet (1 g total) by mouth 4 (four) times daily for 28 days. 08/08/22 09/05/22  Oral Billings, MD    Physical Exam: Vitals:   11/08/23 0825 11/08/23 0827 11/08/23 1015 11/08/23 1210  BP: (!) 120/94  134/66   Pulse: 94  81   Resp: 18  14   Temp: 98.1 F (36.7 C)   98 F (36.7 C)  TempSrc:    Oral  SpO2: 100%  100%   Weight:  67.6 kg    Height:  5\' 3"  (1.6 m)     Physical Exam Constitutional:      General: She is not in acute distress.     Appearance: She is well-developed. She is not ill-appearing.  HENT:     Head: Normocephalic and atraumatic.     Mouth/Throat:     Mouth: Mucous membranes are dry.     Pharynx: Oropharynx is clear. No oropharyngeal exudate.  Eyes:     General: No scleral icterus.    Extraocular Movements: Extraocular movements intact.     Conjunctiva/sclera: Conjunctivae normal.     Pupils: Pupils are equal, round, and reactive to light.  Cardiovascular:     Rate and Rhythm: Normal rate and regular rhythm.     Heart sounds: Normal heart sounds. No murmur heard.    No friction rub. No gallop.  Pulmonary:     Effort: Pulmonary effort is normal. No respiratory distress.     Breath sounds: Normal breath sounds. No wheezing, rhonchi or rales.  Abdominal:     General: Bowel sounds are normal. There is no distension.     Palpations: Abdomen is soft.     Tenderness: There is no guarding or rebound.     Comments: Mild generalized TTP of abdomen.   Musculoskeletal:        General: No swelling. Normal range of motion.     Cervical back: Normal range of motion.  Skin:    General: Skin is warm and dry.  Neurological:     General: No focal deficit present.     Mental Status: She is alert and oriented to person, place, and time.  Psychiatric:        Mood and Affect: Mood normal.        Behavior: Behavior normal.     Data Reviewed:  There are no new results to review at this time.    Latest Ref Rng & Units 11/08/2023    8:27 AM 09/12/2022    9:32 AM 08/08/2022    3:31 AM  CBC  WBC 4.0 - 10.5 K/uL 12.6  8.1  10.3   Hemoglobin 12.0 - 15.0 g/dL 16.1  09.6  04.5   Hematocrit 36.0 - 46.0 % 46.3  37.8  33.5   Platelets 150 - 400 K/uL 281  264.0  226       Latest Ref Rng & Units 11/08/2023    8:27 AM 03/18/2023   11:54 AM 12/12/2022    9:46 AM  CMP  Glucose 70 - 99 mg/dL 147  95  829   BUN 8 - 23 mg/dL 51  11  11   Creatinine 0.44 - 1.00 mg/dL 5.62  1.30  8.65   Sodium 135 - 145 mmol/L 135  142  142    Potassium 3.5 - 5.1 mmol/L 3.5  5.2  4.0   Chloride 98 - 111 mmol/L 99  104  104   CO2 22 - 32 mmol/L 20  24  23    Calcium  8.9 - 10.3 mg/dL 9.2  9.9  9.4   Total Protein 6.5 - 8.1 g/dL 7.8  7.4    Total Bilirubin 0.0 - 1.2 mg/dL 0.9  0.5    Alkaline Phos 38 - 126 U/L 91  158    AST 15 - 41 U/L 22  19    ALT 0 - 44 U/L 16  12     Lipase     Component Value Date/Time   LIPASE 48 11/08/2023 0827   Urinalysis    Component Value Date/Time   COLORURINE YELLOW 11/08/2023 1104   APPEARANCEUR CLOUDY (A) 11/08/2023 1104   LABSPEC 1.016 11/08/2023 1104   PHURINE 5.0 11/08/2023 1104   GLUCOSEU 150 (A) 11/08/2023 1104   HGBUR SMALL (A) 11/08/2023 1104   BILIRUBINUR NEGATIVE 11/08/2023 1104   BILIRUBINUR neg 05/02/2015 1034   KETONESUR 5 (A) 11/08/2023 1104   PROTEINUR 30 (A) 11/08/2023 1104   UROBILINOGEN 0.2 05/02/2015 1034   UROBILINOGEN 0.2 12/15/2013 1011   NITRITE NEGATIVE 11/08/2023 1104   LEUKOCYTESUR LARGE (A) 11/08/2023 1104    CT ABDOMEN PELVIS W CONTRAST Result Date: 11/08/2023 CLINICAL DATA:  Nausea and vomiting EXAM: CT ABDOMEN AND PELVIS WITH CONTRAST TECHNIQUE: Multidetector CT imaging of the abdomen and pelvis was performed using the standard protocol following bolus administration of intravenous contrast. RADIATION DOSE REDUCTION: This exam was performed according to the departmental dose-optimization program which includes automated exposure control, adjustment of the mA and/or kV according to patient size and/or use of iterative reconstruction technique. CONTRAST:  60mL OMNIPAQUE  IOHEXOL  350 MG/ML SOLN COMPARISON:  08/05/2022 FINDINGS: Lower chest:  No contributory findings. Hepatobiliary: No focal liver abnormality.Mild intrahepatic biliary dilatation after cholecystectomy, non worrisome. Pancreas: Unremarkable. Spleen: Unremarkable. Adrenals/Urinary Tract: Negative adrenals. Right nephrectomy. No hydronephrosis or stone. Patchy hypoenhancement and thinning of left renal  cortex attributed to scarring. Small left renal cystic densities. Unremarkable bladder. Stomach/Bowel: No bowel obstruction. Prominent thickness of the midportion duodenum, but less than before and without adjacent fat stranding. Multiple ingested high densities scattered throughout the enteric stream. Vascular/Lymphatic: No acute vascular abnormality. Multifocal atheromatous calcification is unchanged. No mass or adenopathy. Reproductive:No pathologic findings. Other: No ascites or pneumoperitoneum. Musculoskeletal: No acute abnormalities. IMPRESSION: Equivocal for duodenitis, degree less than seen on 08/05/2022 CT. No bowel obstruction. Atherosclerosis. Electronically Signed   By: Ronnette Coke M.D.   On: 11/08/2023 12:32    Assessment and Plan: No notes have been filed under this hospital service. Service: Hospitalist  Duodenitis Patient presenting with N/V/D for the past 4-5 days, found to have duodenitis on CT imaging. She has received a dose of IV protonix  and zofran  with improvement in symptoms. Also has received 2L IVF for rehydration. She was positive for H pylori last year and completed eradication therapy with negative surgical pathology on follow up endoscopy. She has remained afebrile since arrival and is HDS, though with mild leukocytosis on CBC. -IV protonix  BID -zofran  q6h prn -f/u GI path panel and C diff  screen, enteric precautions until these result -NPO, will transition to HH/CM diet tonight to assess PO tolerance (after ruling out DKA) -xarelto for dvt ppx (does not consume pork-derived products) -place in observation / med-surg  AKI Solitary kidney Patient with baseline Cr around 1-1.1. On admission, Cr 1.72. This is likely 2/2 dehydration stemming from above. She has received 2L IVF thus far for rehydration. Will check bladder scan to ensure not retaining. Also checking renal ultrasound. -f/u bladder scan, in and out cath if >300cc -f/u renal ultrasound -trend kidney  function -monitor UOP -avoid nephrotoxic medications as able  Uncontrolled Type 2 diabetes with hyperglycemia Anion gap metabolic acidosis Patient taking lantus  25u daily with sliding scale insulin  at home. Also on metformin  1g daily. Last A1c on file of 9.4% in 03/2023. Blood glucose 323 on CMP with an elevated AG of 16 (starvation ketoacidosis vs DKA). Checking VBG, BHBA, and urinalysis to rule out DKA. She does note taking lantus  25u yesterday but has not taken any insulin  yet today. -f/u A1c -f/u VBG, BHBA, urinalysis -resume semglee  25u at bedtime and added moderate SSI -trend CBGs, goal 140-180 -holding home metformin  -resume home gabapentin  300mg  at bedtime  ADDENDUM: VBG with normal pH, pCO2 43, bicarbonate normal. BHBA very mildly elevated. Does not appear to be in DKA. Will start CM diet as tolerated. Also notified RN to provide half of semglee  dose (~12u) if patient eats <50% of dinner.  HTN Patient on lisinopril  20mg  daily at home. BP ranging in the 120s-130s/60s in the ED. -holding home lisinopril  given AKI -trend BP curve  GERD -IV PPI as noted above  HLD -resume home crestor  20mg  daily   Advance Care Planning:   Code Status: Full Code   Consults: none  Family Communication: updated daughter at bedside  Severity of Illness: The appropriate patient status for this patient is OBSERVATION. Observation status is judged to be reasonable and necessary in order to provide the required intensity of service to ensure the patient's safety. The patient's presenting symptoms, physical exam findings, and initial radiographic and laboratory data in the context of their medical condition is felt to place them at decreased risk for further clinical deterioration. Furthermore, it is anticipated that the patient will be medically stable for discharge from the hospital within 2 midnights of admission.   Portions of this note were generated with Scientist, clinical (histocompatibility and immunogenetics). Dictation  errors may occur despite best attempts at proofreading.  Author: Abner Ardis H Jodi Criscuolo, MD 11/08/2023 12:56 PM  For on call review [email or web address].

## 2023-11-08 NOTE — Hospital Course (Signed)
 4 days of gastroenteritis -diarrhea -vomiting  AKI -getting IVF

## 2023-11-08 NOTE — Progress Notes (Signed)
 Dr. Jinwala called and notified me that this evening if the patient does not eat or does not feel up to it to give half of the 25units of semglee  not the full dose.

## 2023-11-09 DIAGNOSIS — K2980 Duodenitis without bleeding: Secondary | ICD-10-CM | POA: Diagnosis not present

## 2023-11-09 DIAGNOSIS — K529 Noninfective gastroenteritis and colitis, unspecified: Secondary | ICD-10-CM | POA: Diagnosis not present

## 2023-11-09 LAB — CBC
HCT: 37 % (ref 36.0–46.0)
Hemoglobin: 12 g/dL (ref 12.0–15.0)
MCH: 29 pg (ref 26.0–34.0)
MCHC: 32.4 g/dL (ref 30.0–36.0)
MCV: 89.4 fL (ref 80.0–100.0)
Platelets: 219 10*3/uL (ref 150–400)
RBC: 4.14 MIL/uL (ref 3.87–5.11)
RDW: 12.8 % (ref 11.5–15.5)
WBC: 9.1 10*3/uL (ref 4.0–10.5)
nRBC: 0 % (ref 0.0–0.2)

## 2023-11-09 LAB — BASIC METABOLIC PANEL WITH GFR
Anion gap: 7 (ref 5–15)
BUN: 37 mg/dL — ABNORMAL HIGH (ref 8–23)
CO2: 22 mmol/L (ref 22–32)
Calcium: 8.5 mg/dL — ABNORMAL LOW (ref 8.9–10.3)
Chloride: 111 mmol/L (ref 98–111)
Creatinine, Ser: 1.29 mg/dL — ABNORMAL HIGH (ref 0.44–1.00)
GFR, Estimated: 43 mL/min — ABNORMAL LOW (ref >60)
Glucose, Bld: 68 mg/dL — ABNORMAL LOW (ref 70–99)
Potassium: 3.2 mmol/L — ABNORMAL LOW (ref 3.5–5.1)
Sodium: 140 mmol/L (ref 135–145)

## 2023-11-09 LAB — GLUCOSE, CAPILLARY: Glucose-Capillary: 161 mg/dL — ABNORMAL HIGH (ref 70–99)

## 2023-11-09 MED ORDER — LISINOPRIL 20 MG PO TABS
20.0000 mg | ORAL_TABLET | Freq: Every day | ORAL | Status: DC
Start: 2023-11-13 — End: 2023-11-26

## 2023-11-09 MED ORDER — POTASSIUM CHLORIDE CRYS ER 20 MEQ PO TBCR
60.0000 meq | EXTENDED_RELEASE_TABLET | Freq: Once | ORAL | Status: AC
Start: 2023-11-09 — End: 2023-11-09
  Administered 2023-11-09: 60 meq via ORAL
  Filled 2023-11-09: qty 3

## 2023-11-09 MED ORDER — PANTOPRAZOLE SODIUM 40 MG PO TBEC: 40.0000 mg | DELAYED_RELEASE_TABLET | Freq: Two times a day (BID) | ORAL | 0 refills | Status: DC

## 2023-11-09 NOTE — Progress Notes (Signed)
 Apple juice given for BG [id number]. Will recheck CBG. Information passed on to [place], [place].

## 2023-11-09 NOTE — Progress Notes (Signed)
 DISCHARGE NOTE HOME Tina Phelps to be discharged Home per MD order. Discussed prescriptions and follow up appointments with the patient. Prescriptions given to patient; medication list explained in detail. Patient verbalized understanding.  Skin clean, dry and intact without evidence of skin break down, no evidence of skin tears noted. IV catheter discontinued intact. Site without signs and symptoms of complications. Dressing and pressure applied. Pt denies pain at the site currently. No complaints noted.  Patient free of lines, drains, and wounds.   An After Visit Summary (AVS) was printed and given to the patient. Patient escorted via wheelchair, and discharged home via private auto.  Elvina Hammers, RN

## 2023-11-09 NOTE — Discharge Summary (Signed)
 [facility]  Physician Discharge Summary   Patient ID: Tina Phelps MRN: [id number] DOB/AGE: June 23, 1946 78 y.o.  Admit date: 11/08/2023 Discharge date: 11/09/2023    PCP: Joaquin Mulberry, MD  DISCHARGE DIAGNOSES:  Acute gastroenteritis, resolved   Duodenitis Acute kidney injury, improved Solitary kidney Diabetes mellitus type 2, uncontrolled with hyperglycemia Essential hypertension Hyperlipidemia   RECOMMENDATIONS FOR OUTPATIENT FOLLOW UP: Follow-up with PCP   Home Health: None Equipment/Devices: None  CODE STATUS: Full code  DISCHARGE CONDITION: fair  Diet recommendation: As before  INITIAL HISTORY: As per H&P: 78 y.o. female with medical history significant of uncontrolled T2DM, hx of H pylori duodenitis s/p eradication therapy, HTN, GERD, HLD, solitary kidney presenting to the ED with generalized abdominal pain, N/V, and diarrhea for the past 4-5 days.   Patient reports that she was in her usual state of health until about 4-5 days ago. She began experiencing nausea with nonbilious nonbloody emesis and watery diarrhea.  States that she has been having about 4-5 episodes of watery diarrhea daily without any blood noted. She has also experienced emesis after any PO intake at home and thus has not eaten any food in the past few days and has had minimal fluid intake. Around the time these symptoms began, she also experienced intermittent generalized abdominal discomfort. She reports that she has a history of similar symptoms in the past and has been seen by GI as an outpatient, who recommended PPI therapy. She denies any fevers, chills, chest pain, palpitations, SHOB, urinary changes.   Per chart review, patient did test positive for H pylori in March 2024. She completed eradication therapy with bismuth  subsalicylate, metronidazole , doxycycline , and pantoprazole . She had a follow up endoscopy and colonoscopy in May 2024 with surgical pathology negative for H pylori.  Endoscopy was otherwise normal and colonoscopy revealed polyps which were resected and found to be mostly benign with a few that were precancerous. Repeat colonoscopy recommended in 5 years from that time.    ED course: Vital signs stable.  CBC with mild leukocytosis, otherwise unremarkable.  CMP with glucose 323, creatinine 1.72 (up from baseline of 1-1.1), anion gap 16, bicarbonate 20.  Urinalysis with glucosuria, mild ketonuria, mild hematuria, large leukocytes, many bacteria but was not a clean-catch given 11-20 squamous epithelial cells.  Repeat urinalysis pending.  CT abdomen pelvis with contrast showing findings that are equivocal for duodenitis though to a degree less than that seen in February 2024.  No obstruction noted on CT.  Patient given IV Protonix , 2 L IV fluid bolus, Zofran  with improvement.  [facility] asked to evaluate patient for admission.   HOSPITAL COURSE:   Acute gastroenteritis/duodenitis Patient presenting with N/V/D for the past 4-5 days, found to have duodenitis on CT imaging.  She has previous history of same.  She was positive for H pylori last year and completed eradication therapy with negative surgical pathology on follow up endoscopy.  She was given symptomatic treatment with improvement.  She has tolerated diet today.  She is very keen on going home.  Daughter is at the bedside and will be there to help her at home.   Has ambulated without difficulty.  Okay for discharge.   AKI Solitary kidney Hypokalemia Patient with baseline Cr around 1-1.1. On admission, Cr 1.72. This is likely secondary to dehydration stemming from above.  She was hydrated with improvement in renal function.  Potassium to be supplemented prior to discharge.     Uncontrolled Type 2 diabetes with hyperglycemia Anion gap metabolic  acidosis Came in with elevated blood glucose levels.  HbA1c 9.5. Improvement noted with insulin  administration. May resume her usual home medication regimen.   Essential  hypertension   GERD Hyperlipidemia  Stable.  Wants to go home today.  Labs have improved.  She feels better.  No further episodes of nausea vomiting or diarrhea.  Tolerated diet.  Okay for discharge.  Discussed with her daughter who agrees with plan.   PERTINENT LABS:  The results of significant diagnostics from this hospitalization (including imaging, microbiology, ancillary and laboratory) are listed below for reference.     Labs:   Basic Metabolic Panel: Recent Labs  Lab 11/08/23 0827 11/09/23 0519  NA 135 140  K 3.5 3.2*  CL 99 111  CO2 20* 22  GLUCOSE 323* 68*  BUN 51* 37*  CREATININE 1.72* 1.29*  CALCIUM  9.2 8.5*   Liver Function Tests: Recent Labs  Lab 11/08/23 0827  AST 22  ALT 16  ALKPHOS 91  BILITOT 0.9  PROT 7.8  ALBUMIN 3.9   Recent Labs  Lab 11/08/23 0827  LIPASE 48    CBC: Recent Labs  Lab 11/08/23 0827 11/09/23 0519  WBC 12.6* 9.1  HGB 15.0 12.0  HCT 46.3* 37.0  MCV 89.6 89.4  PLT 281 219   CBG: Recent Labs  Lab 11/08/23 1619 11/08/23 2032 11/09/23 0818  GLUCAP 183* 81 161*     IMAGING STUDIES US  RENAL Result Date: 11/08/2023 CLINICAL DATA:  Acute kidney injury. EXAM: RENAL / URINARY TRACT ULTRASOUND COMPLETE COMPARISON:  Same-day abdominopelvic CT. FINDINGS: Right Kidney: Nephrectomy.  Not visualized. Left Kidney: Renal measurements: 11.2 x 4.8 x 4.8 cm = volume: 134 mL. No hydronephrosis. Normal parenchymal echogenicity. This cyst on same day CT is not seen by ultrasound. No visualized renal calculi. Bladder: Only minimally distended. Appears normal for degree of bladder distention. Other: None. IMPRESSION: 1. No left hydronephrosis. Cyst on same day CT is not seen by ultrasound. 2. Post right nephrectomy. Electronically Signed   By: Chadwick Colonel M.D.   On: 11/08/2023 18:06   CT ABDOMEN PELVIS W CONTRAST Result Date: 11/08/2023 CLINICAL DATA:  Nausea and vomiting EXAM: CT ABDOMEN AND PELVIS WITH CONTRAST TECHNIQUE:  Multidetector CT imaging of the abdomen and pelvis was performed using the standard protocol following bolus administration of intravenous contrast. RADIATION DOSE REDUCTION: This exam was performed according to the departmental dose-optimization program which includes automated exposure control, adjustment of the mA and/or kV according to patient size and/or use of iterative reconstruction technique. CONTRAST:  60mL OMNIPAQUE  IOHEXOL  350 MG/ML SOLN COMPARISON:  08/05/2022 FINDINGS: Lower chest:  No contributory findings. Hepatobiliary: No focal liver abnormality.Mild intrahepatic biliary dilatation after cholecystectomy, non worrisome. Pancreas: Unremarkable. Spleen: Unremarkable. Adrenals/Urinary Tract: Negative adrenals. Right nephrectomy. No hydronephrosis or stone. Patchy hypoenhancement and thinning of left renal cortex attributed to scarring. Small left renal cystic densities. Unremarkable bladder. Stomach/Bowel: No bowel obstruction. Prominent thickness of the midportion duodenum, but less than before and without adjacent fat stranding. Multiple ingested high densities scattered throughout the enteric stream. Vascular/Lymphatic: No acute vascular abnormality. Multifocal atheromatous calcification is unchanged. No mass or adenopathy. Reproductive:No pathologic findings. Other: No ascites or pneumoperitoneum. Musculoskeletal: No acute abnormalities. IMPRESSION: Equivocal for duodenitis, degree less than seen on 08/05/2022 CT. No bowel obstruction. Atherosclerosis. Electronically Signed   By: Ronnette Coke M.D.   On: 11/08/2023 12:32    DISCHARGE EXAMINATION: Vitals:   11/08/23 1614 11/08/23 2031 11/09/23 0602 11/09/23 0817  BP: 138/61 130/62 136/61 (!) 135/45  Pulse: 86 70 62 66  Resp: 18 18 18 19   Temp: 98.3 F (36.8 C) 97.9 F (36.6 C) 97.8 F (36.6 C) 97.7 F (36.5 C)  TempSrc: Oral Oral Oral Oral  SpO2: 90% 100% 100% 100%  Weight:      Height:       General appearance: Awake alert.  In  no distress Resp: Clear to auscultation bilaterally.  Normal effort Cardio: S1-S2 is normal regular.  No S3-S4.  No rubs murmurs or bruit GI: Abdomen is soft.  Nontender nondistended.  Bowel sounds are present normal.  No masses organomegaly    DISPOSITION: Home with daughter  Discharge Instructions     Call MD for:  difficulty breathing, headache or visual disturbances   Complete by: As directed    Call MD for:  extreme fatigue   Complete by: As directed    Call MD for:  persistant dizziness or light-headedness   Complete by: As directed    Call MD for:  persistant nausea and vomiting   Complete by: As directed    Call MD for:  severe uncontrolled pain   Complete by: As directed    Call MD for:  temperature >100.4   Complete by: As directed    Diet - low sodium heart healthy   Complete by: As directed    Discharge instructions   Complete by: As directed    Please do not take your lisinopril  until 11/13/2023.  Find out from your primary care provider if you are supposed to take chlorthalidone  or not.  Prescription for Protonix  has been sent to your pharmacy.  You were cared for by a hospitalist during your hospital stay. If you have any questions about your discharge medications or the care you received while you were in the hospital after you are discharged, you can call the unit and asked to speak with the hospitalist on call if the hospitalist that took care of you is not available. Once you are discharged, your primary care physician will handle any further medical issues. Please note that NO REFILLS for any discharge medications will be authorized once you are discharged, as it is imperative that you return to your primary care physician (or establish a relationship with a primary care physician if you do not have one) for your aftercare needs so that they can reassess your need for medications and monitor your lab values. If you do not have a primary care physician, you can call  [phone] for a physician referral.   Increase activity slowly   Complete by: As directed          Allergies as of 11/09/2023       Reactions   Pork-derived Products Other (See Comments)   Religious preference- non halal        Medication List     STOP taking these medications    chlorthalidone  25 MG tablet Commonly known as: HYGROTON    chlorthalidone  50 MG tablet Commonly known as: HYGROTON    insulin  glargine-yfgn 100 UNIT/ML Pen Commonly known as: SEMGLEE    NovoLOG  FlexPen 100 UNIT/ML FlexPen Generic drug: insulin  aspart       TAKE these medications    B-D UF III MINI PEN NEEDLES 31G X 5 MM Misc Generic drug: Insulin  Pen Needle USE AS DIRECTED   Basaglar  KwikPen 100 UNIT/ML Inject 25 Units into the skin daily.   brimonidine  0.2 % ophthalmic solution Commonly known as: ALPHAGAN  Place 1 drop into both eyes 2 (two) times daily.  dorzolamide -timolol  2-0.5 % ophthalmic solution Commonly known as: COSOPT  Place 1 drop into both eyes 2 (two) times daily.   gabapentin  300 MG capsule Commonly known as: NEURONTIN  Take 1 capsule (300 mg total) by mouth at bedtime.   latanoprost  0.005 % ophthalmic solution Commonly known as: XALATAN  Place 1 drop into both eyes every evening.   lisinopril  20 MG tablet Commonly known as: ZESTRIL  Take 1 tablet (20 mg total) by mouth daily. Please resume only on 11/13/2023. Start taking on: Nov 13, 2023 What changed:  additional instructions These instructions start on Nov 13, 2023. If you are unsure what to do until then, ask your doctor or other care provider.   metFORMIN  500 MG 24 hr tablet Commonly known as: GLUCOPHAGE -XR Take 2 tablets (1,000 mg total) by mouth daily with breakfast. Please schedule PCP appt for more refills!   Muro 128 5 % ophthalmic ointment Generic drug: sodium chloride  Place 1 Application into both eyes at bedtime.   pantoprazole  40 MG tablet Commonly known as: PROTONIX  Take 1 tablet (40 mg  total) by mouth 2 (two) times daily.   rosuvastatin  20 MG tablet Commonly known as: Crestor  Take 1 tablet (20 mg total) by mouth daily.   Vitamin D3 1000 units Caps Take 1,000 Units by mouth daily.          Follow-up Information     Joaquin Mulberry, MD. Schedule an appointment as soon as possible for a visit in 1 week(s).   Specialty: Family Medicine Why: post hospitalization follow up Contact information: [address] [place] [address] [place] [address] [phone]                 TOTAL DISCHARGE TIME: 35 minutes  Ory Elting Lyndon Santiago  [facility] Pager on [email or web address]  11/10/2023, 12:35 PM

## 2023-11-09 NOTE — Care Management Obs Status (Addendum)
 MEDICARE OBSERVATION STATUS NOTIFICATION   Patient Details  Name: Evaleigh Deese MRN: [id number] Date of Birth: Nov 17, 1945   Medicare Observation Status Notification Given:   Attempt to deliver moon letter. Provider at bedside    Jannine Meo, RN 11/09/2023, 8:27 AM

## 2023-11-09 NOTE — Care Management Obs Status (Addendum)
 MEDICARE OBSERVATION STATUS NOTIFICATION   Patient Details  Name: Tina Phelps MRN: [id number] Date of Birth: 1946-02-25   Medicare Observation Status Notification Given:  Yes  Daughter, Arlander Labrum translated at bedside.  Jannine Meo, RN 11/09/2023, 9:30 AM

## 2023-11-10 ENCOUNTER — Telehealth: Payer: Self-pay

## 2023-11-10 ENCOUNTER — Other Ambulatory Visit: Payer: Self-pay

## 2023-11-10 LAB — HEMOGLOBIN A1C
Hgb A1c MFr Bld: 9.5 % — ABNORMAL HIGH (ref 4.8–5.6)
Mean Plasma Glucose: 226 mg/dL

## 2023-11-10 NOTE — Transitions of Care (Post Inpatient/ED Visit) (Signed)
   11/10/2023  Name: Arantza Kellermann MRN: [id number] DOB: [birth year]  Today's TOC FU Call Status: Today's TOC FU Call Status:: Unsuccessful Call (1st Attempt) Unsuccessful Call (1st Attempt) Date: 11/10/23  Attempted to reach the patient regarding the most recent Inpatient/ED visit.  Follow Up Plan: Additional outreach attempts will be made to reach the patient to complete the Transitions of Care (Post Inpatient/ED visit) call.   Signature Burnett Carson, RN

## 2023-11-11 ENCOUNTER — Telehealth: Payer: Self-pay

## 2023-11-11 NOTE — Transitions of Care (Post Inpatient/ED Visit) (Signed)
   11/11/2023  Name: Tina Phelps MRN: [id number] DOB: [birth year]  Today's TOC FU Call Status: Today's TOC FU Call Status:: Unsuccessful Call (2nd Attempt) Unsuccessful Call (1st Attempt) Date: 11/10/23 Unsuccessful Call (2nd Attempt) Date: 11/11/23  Attempted to reach the patient regarding the most recent Inpatient/ED visit.  Follow Up Plan: Additional outreach attempts will be made to reach the patient to complete the Transitions of Care (Post Inpatient/ED visit) call.   Signature  Burnett Carson, RN

## 2023-11-12 ENCOUNTER — Telehealth: Payer: Self-pay

## 2023-11-12 NOTE — Transitions of Care (Post Inpatient/ED Visit) (Signed)
   11/12/2023  Name: Tina Phelps MRN: [id number] DOB: 1946/01/05  Today's TOC FU Call Status: Today's TOC FU Call Status:: Unsuccessful Call (3rd Attempt) Unsuccessful Call (1st Attempt) Date: 11/10/23 Unsuccessful Call (2nd Attempt) Date: 11/11/23 Unsuccessful Call (3rd Attempt) Date: 11/12/23  Attempted to reach the patient regarding the most recent Inpatient/ED visit.  Follow Up Plan: No further outreach attempts will be made at this time. We have been unable to contact the patient.  The patient has an appointment with Winda Hastings, NP at [facility] on 11/26/2023.   Signature  Burnett Carson, RN

## 2023-11-26 ENCOUNTER — Other Ambulatory Visit: Payer: Self-pay

## 2023-11-26 ENCOUNTER — Encounter: Payer: Self-pay | Admitting: Nurse Practitioner

## 2023-11-26 ENCOUNTER — Ambulatory Visit: Attending: Nurse Practitioner | Admitting: Nurse Practitioner

## 2023-11-26 VITALS — BP 125/74 | HR 69 | Resp 19 | Ht 63.0 in | Wt 152.6 lb

## 2023-11-26 DIAGNOSIS — E119 Type 2 diabetes mellitus without complications: Secondary | ICD-10-CM | POA: Insufficient documentation

## 2023-11-26 DIAGNOSIS — H409 Unspecified glaucoma: Secondary | ICD-10-CM | POA: Insufficient documentation

## 2023-11-26 DIAGNOSIS — K2980 Duodenitis without bleeding: Secondary | ICD-10-CM | POA: Diagnosis not present

## 2023-11-26 DIAGNOSIS — M1990 Unspecified osteoarthritis, unspecified site: Secondary | ICD-10-CM | POA: Insufficient documentation

## 2023-11-26 DIAGNOSIS — E876 Hypokalemia: Secondary | ICD-10-CM | POA: Diagnosis not present

## 2023-11-26 DIAGNOSIS — Z09 Encounter for follow-up examination after completed treatment for conditions other than malignant neoplasm: Secondary | ICD-10-CM | POA: Diagnosis not present

## 2023-11-26 DIAGNOSIS — Z7984 Long term (current) use of oral hypoglycemic drugs: Secondary | ICD-10-CM | POA: Insufficient documentation

## 2023-11-26 DIAGNOSIS — Z794 Long term (current) use of insulin: Secondary | ICD-10-CM | POA: Insufficient documentation

## 2023-11-26 DIAGNOSIS — E1169 Type 2 diabetes mellitus with other specified complication: Secondary | ICD-10-CM

## 2023-11-26 DIAGNOSIS — K219 Gastro-esophageal reflux disease without esophagitis: Secondary | ICD-10-CM | POA: Diagnosis not present

## 2023-11-26 DIAGNOSIS — I10 Essential (primary) hypertension: Secondary | ICD-10-CM | POA: Diagnosis not present

## 2023-11-26 DIAGNOSIS — E785 Hyperlipidemia, unspecified: Secondary | ICD-10-CM | POA: Diagnosis not present

## 2023-11-26 MED ORDER — PANTOPRAZOLE SODIUM 40 MG PO TBEC
40.0000 mg | DELAYED_RELEASE_TABLET | Freq: Every day | ORAL | 1 refills | Status: DC
  Filled 2023-11-26 – 2024-02-02 (×2): qty 90, 90d supply, fill #0

## 2023-11-26 MED ORDER — INSULIN GLARGINE-YFGN 100 UNIT/ML ~~LOC~~ SOPN
25.0000 [IU] | PEN_INJECTOR | Freq: Every day | SUBCUTANEOUS | 3 refills | Status: DC
  Filled 2023-11-26: qty 15, 60d supply, fill #0
  Filled 2024-02-02 – 2024-02-03 (×2): qty 15, 60d supply, fill #1
  Filled 2024-02-03: qty 15, 60d supply, fill #0

## 2023-11-26 MED ORDER — METFORMIN HCL ER 500 MG PO TB24
1000.0000 mg | ORAL_TABLET | Freq: Every day | ORAL | 1 refills | Status: DC
  Filled 2023-11-26: qty 180, 90d supply, fill #0

## 2023-11-26 MED ORDER — LISINOPRIL 20 MG PO TABS
20.0000 mg | ORAL_TABLET | Freq: Every day | ORAL | 1 refills | Status: DC
  Filled 2023-11-26 – 2024-02-02 (×2): qty 90, 90d supply, fill #0

## 2023-11-26 MED ORDER — ROSUVASTATIN CALCIUM 20 MG PO TABS
20.0000 mg | ORAL_TABLET | Freq: Every day | ORAL | 1 refills | Status: DC
  Filled 2023-11-26 – 2024-02-02 (×2): qty 90, 90d supply, fill #0

## 2023-11-26 MED ORDER — GABAPENTIN 300 MG PO CAPS
300.0000 mg | ORAL_CAPSULE | Freq: Every day | ORAL | 1 refills | Status: AC
  Filled 2023-11-26 – 2024-02-02 (×2): qty 90, 90d supply, fill #0
  Filled 2024-07-05: qty 90, 90d supply, fill #1

## 2023-11-26 NOTE — Progress Notes (Signed)
 Assessment & Plan:  Tina Phelps was seen today for hospitalization follow-up.  Diagnoses and all orders for this visit:  Duodenitis Continue PPI and avoid foods that trigger GERD -     pantoprazole  (PROTONIX ) 40 MG tablet; Take 1 tablet (40 mg total) by mouth daily.  Type 2 diabetes mellitus with other specified complication, with long-term current use of insulin  Follow-up A1c with PCP -     gabapentin  (NEURONTIN ) 300 MG capsule; Take 1 capsule (300 mg total) by mouth at bedtime. -     metFORMIN  (GLUCOPHAGE -XR) 500 MG 24 hr tablet; Take 2 tablets (1,000 mg total) by mouth daily with breakfast. -     lisinopril  (ZESTRIL ) 20 MG tablet; Take 1 tablet (20 mg total) by mouth daily. -     insulin  glargine-yfgn (SEMGLEE ) 100 UNIT/ML Pen; Inject 25 Units into the skin daily.  Hyperlipidemia associated with type 2 diabetes mellitus (HCC) -     rosuvastatin  (CRESTOR ) 20 MG tablet; Take 1 tablet (20 mg total) by mouth daily. INSTRUCTIONS: Work on a low fat, heart healthy diet and participate in regular aerobic exercise program by working out at least 150 minutes per week; 5 days a week-30 minutes per day. Avoid red meat/beef/steak,  fried foods. junk foods, sodas, sugary drinks, unhealthy snacking, alcohol and smoking.  Drink at least 80 oz of water per day and monitor your carbohydrate intake daily.    Hypokalemia [facility] discharge follow-up -     pantoprazole  (PROTONIX ) 40 MG tablet; Take 1 tablet (40 mg total) by mouth daily.    Patient has been counseled on age-appropriate routine health concerns for screening and prevention. These are reviewed and up-to-date. Referrals have been placed accordingly. Immunizations are up-to-date or declined.    Subjective:   Chief Complaint  Patient presents with   Hospitalization Follow-up    [name] 78 y.o. female presents to office today for HFU  She is a patient of Dr. Newlin  She has a past medical history of Arthritis, DM2,  H. pylori duodenitis, glaucoma, and Hypertension, GERD, hyperlipidemia.   [name] was admitted on May 10 and discharged on Nov 09, 2023 after being treated for acute gastroenteritis, duodenitis, acute kidney injury.  PER HOSPITAL SUMMARY:  Patient reports that she was in her usual state of health until about 4-5 days ago. She began experiencing nausea with nonbilious nonbloody emesis and watery diarrhea. States that she has been having about 4-5 episodes of watery diarrhea daily without any blood noted. She has also experienced emesis after any PO intake at home and thus has not eaten any food in the past few days and has had minimal fluid intake. Around the time these symptoms began, she also experienced intermittent generalized abdominal discomfort. She reports that she has a history of similar symptoms in the past and has been seen by GI as an outpatient, who recommended PPI therapy.  CT abdomen pelvis with contrast showing findings that are equivocal for duodenitis though to a degree less than that seen in February 2024. No obstruction noted on CT. Patient given IV Protonix , 2 L IV fluid bolus, Zofran  with improvement.  She was started on amlodipine  2.5 mg due to HTN and was instructed to continue carvedilol and lisinopril . Chlorthalidone  was dc'd due to AKI.     Today she is accompanied by her daughter who is interpreting for her.  Patient does not endorse any new or worsening GI symptoms.  Daughter is upset  stating she called numerous times to have her mother's pantoprazole  refilled as she believes the reason her mother had to go to the hospital was due to her being out of her PPI causing her GI symptoms, decreased appetite and dehydration.  I did instruct her daughter that if there are any issues in the future with obtaining medication she should reach out to the PCP for MyChart which she has not done previously.     Daughter is concerned that her mother has high levels of anxiety and stress.   Patient is declining any anxiety lytics or SSRIs today.    11/26/2023   10:37 AM 10/14/2023    2:17 PM 03/18/2023   10:40 AM  Depression screen PHQ 2/9  Decreased Interest 0 0 0  Down, Depressed, Hopeless 1 0 0  PHQ - 2 Score 1 0 0  Altered sleeping 0 0   Tired, decreased energy 1 0   Change in appetite 1 0   Feeling bad or failure about yourself  0 0   Trouble concentrating 0 0   Moving slowly or fidgety/restless 0 0   Suicidal thoughts 0 0   PHQ-9 Score 3 0   Difficult doing work/chores Not difficult at all Not difficult at all        11/26/2023   10:38 AM 02/11/2022    2:14 PM 12/27/2021   10:35 AM 06/05/2021   10:23 AM  GAD 7 : Generalized Anxiety Score  Nervous, Anxious, on Edge 1 0 0 0  Control/stop worrying 0 0 0 0  Worry too much - different things 1 0 0 0  Trouble relaxing 1 0 0 0  Restless 0 0 0 0  Easily annoyed or irritable 1 0 0 0  Afraid - awful might happen 1 0 0 0  Total GAD 7 Score 5 0 0 0  Anxiety Difficulty Not difficult at all          Review of Systems  Constitutional:  Negative for fever, malaise/fatigue and weight loss.  HENT: Negative.  Negative for nosebleeds.   Eyes: Negative.  Negative for blurred vision, double vision and photophobia.  Respiratory: Negative.  Negative for cough and shortness of breath.   Cardiovascular: Negative.  Negative for chest pain, palpitations and leg swelling.  Gastrointestinal: Negative.  Negative for abdominal pain, blood in stool, constipation, diarrhea, heartburn, melena, nausea and vomiting.  Musculoskeletal: Negative.  Negative for myalgias.  Neurological: Negative.  Negative for dizziness, focal weakness, seizures and headaches.  Psychiatric/Behavioral: Negative.  Negative for suicidal ideas.     Past Medical History:  Diagnosis Date   Arthritis    Diabetes mellitus without complication (HCC)    Glaucoma    Hypertension     Past Surgical History:  Procedure Laterality Date   CHOLECYSTECTOMY      NEPHRECTOMY Right    OOPHORECTOMY Right     Family History  Problem Relation Age of Onset   Diabetes Sister    Diabetes Brother    Diabetes Daughter     Social History Reviewed with no changes to be made today.   Outpatient Medications Prior to Visit  Medication Sig Dispense Refill   brimonidine  (ALPHAGAN ) 0.2 % ophthalmic solution Place 1 drop into both eyes 2 (two) times daily. 15 mL 12   Cholecalciferol (VITAMIN D3) 1000 units CAPS Take 1,000 Units by mouth daily.     dorzolamide -timolol  (COSOPT ) 2-0.5 % ophthalmic solution Place 1 drop into both eyes 2 (two) times daily. 15  mL 12   Insulin  Pen Needle (B-D UF III MINI PEN NEEDLES) 31G X 5 MM MISC USE AS DIRECTED 100 each 1   latanoprost  (XALATAN ) 0.005 % ophthalmic solution Place 1 drop into both eyes every evening. 2.5 mL 12   MURO 128 5 % ophthalmic ointment Place 1 Application into both eyes at bedtime.     gabapentin  (NEURONTIN ) 300 MG capsule Take 1 capsule (300 mg total) by mouth at bedtime. 90 capsule 1   Insulin  Glargine (BASAGLAR  KWIKPEN) 100 UNIT/ML Inject 25 Units into the skin daily.     lisinopril  (ZESTRIL ) 20 MG tablet Take 1 tablet (20 mg total) by mouth daily. Please resume only on 11/13/2023.     metFORMIN  (GLUCOPHAGE -XR) 500 MG 24 hr tablet Take 2 tablets (1,000 mg total) by mouth daily with breakfast. Please schedule PCP appt for more refills! 60 tablet 0   rosuvastatin  (CRESTOR ) 20 MG tablet Take 1 tablet (20 mg total) by mouth daily. 90 tablet 1   pantoprazole  (PROTONIX ) 40 MG tablet Take 1 tablet (40 mg total) by mouth 2 (two) times daily. (Patient not taking: Reported on 11/26/2023) 60 tablet 0   No facility-administered medications prior to visit.    Allergies  Allergen Reactions   Pork-Derived Products Other (See Comments)    Religious preference- non halal       Objective:    BP 125/74 (BP Location: Right Arm, Patient Position: Sitting, Cuff Size: Normal)   Pulse 69   Resp 19   Ht 5\' 3"  (1.6 m)    Wt 152 lb 9.6 oz (69.2 kg)   SpO2 100%   BMI 27.03 kg/m  Wt Readings from Last 3 Encounters:  11/26/23 152 lb 9.6 oz (69.2 kg)  11/08/23 149 lb (67.6 kg)  10/14/23 149 lb (67.6 kg)    Physical Exam Vitals and nursing note reviewed.  Constitutional:      Appearance: She is well-developed.  HENT:     Head: Normocephalic and atraumatic.  Cardiovascular:     Rate and Rhythm: Normal rate and regular rhythm.     Heart sounds: Normal heart sounds. No murmur heard.    No friction rub. No gallop.  Pulmonary:     Effort: Pulmonary effort is normal. No tachypnea or respiratory distress.     Breath sounds: Normal breath sounds. No decreased breath sounds, wheezing, rhonchi or rales.  Chest:     Chest wall: No tenderness.  Abdominal:     General: Bowel sounds are normal.     Palpations: Abdomen is soft.  Musculoskeletal:        General: Normal range of motion.     Cervical back: Normal range of motion.  Skin:    General: Skin is warm and dry.  Neurological:     Mental Status: She is alert and oriented to person, place, and time.     Coordination: Coordination normal.  Psychiatric:        Behavior: Behavior normal. Behavior is cooperative.        Thought Content: Thought content normal.        Judgment: Judgment normal.          Patient has been counseled extensively about nutrition and exercise as well as the importance of adherence with medications and regular follow-up. The patient was given clear instructions to go to ER or return to medical center if symptoms don't improve, worsen or new problems develop. The patient verbalized understanding.   Follow-up: Return in about 3 months (  around 02/26/2024) for a1c-PCP.   Collins Dean, FNP-BC [facility] and [facility] [place], [place] [phone]   11/26/2023, 1:08 PM

## 2023-11-27 ENCOUNTER — Other Ambulatory Visit: Payer: Self-pay

## 2023-11-27 LAB — CMP14+EGFR
ALT: 11 IU/L (ref 0–32)
AST: 16 IU/L (ref 0–40)
Albumin: 4.2 g/dL (ref 3.8–4.8)
Alkaline Phosphatase: 125 IU/L — ABNORMAL HIGH (ref 44–121)
BUN/Creatinine Ratio: 18 (ref 12–28)
BUN: 20 mg/dL (ref 8–27)
Bilirubin Total: 0.3 mg/dL (ref 0.0–1.2)
CO2: 21 mmol/L (ref 20–29)
Calcium: 10 mg/dL (ref 8.7–10.3)
Chloride: 102 mmol/L (ref 96–106)
Creatinine, Ser: 1.14 mg/dL — ABNORMAL HIGH (ref 0.57–1.00)
Globulin, Total: 2.7 g/dL (ref 1.5–4.5)
Glucose: 298 mg/dL — ABNORMAL HIGH (ref 70–99)
Potassium: 4.7 mmol/L (ref 3.5–5.2)
Sodium: 139 mmol/L (ref 134–144)
Total Protein: 6.9 g/dL (ref 6.0–8.5)
eGFR: 50 mL/min/{1.73_m2} — ABNORMAL LOW (ref >59)

## 2023-11-30 ENCOUNTER — Ambulatory Visit: Payer: Self-pay | Admitting: Nurse Practitioner

## 2023-12-08 ENCOUNTER — Encounter: Payer: Self-pay | Admitting: Pharmacist

## 2023-12-08 ENCOUNTER — Inpatient Hospital Stay: Admitting: Nurse Practitioner

## 2023-12-08 ENCOUNTER — Other Ambulatory Visit (HOSPITAL_BASED_OUTPATIENT_CLINIC_OR_DEPARTMENT_OTHER): Admitting: Pharmacist

## 2023-12-08 DIAGNOSIS — Z7984 Long term (current) use of oral hypoglycemic drugs: Secondary | ICD-10-CM

## 2023-12-08 DIAGNOSIS — Z7985 Long-term (current) use of injectable non-insulin antidiabetic drugs: Secondary | ICD-10-CM

## 2023-12-08 DIAGNOSIS — E119 Type 2 diabetes mellitus without complications: Secondary | ICD-10-CM

## 2023-12-08 NOTE — Progress Notes (Signed)
 Pharmacy TNM Diabetes Measure Review  S:  Patient was identified in a report as being at risk for failing the True [place] Metric of A1c control (<8%). Last A1c was 9.5. Last PCP visit was 03/17/2024. She saw Zelda on 11/26/2023.  Call placed to patient to discuss diabetes control and medication management. Patient has not been seen by the clinical pharmacist before.  Current diabetes medications include: Semglee  25 units daily, metformin  500 mg XR, pt takes 2 tablets (1000mg ) daily Patient reports adherence to taking all medications as prescribed.   Insurance coverage: Medicare  Patient denies hypoglycemic events.  O:  Lab Results  Component Value Date   HGBA1C 9.5 (H) 11/08/2023   There were no vitals filed for this visit.  Lipid Panel     Component Value Date/Time   CHOL 133 03/18/2023 1154   TRIG 75 03/18/2023 1154   HDL 60 03/18/2023 1154   CHOLHDL 3.6 03/01/2021 0902   CHOLHDL 3.2 12/15/2013 1011   VLDL 26 12/15/2013 1011   LDLCALC 58 03/18/2023 1154    Clinical Atherosclerotic Cardiovascular Disease (ASCVD): No  The 10-year ASCVD risk score (Arnett DK, et al., 2019) is: 23.7%   Values used to calculate the score:     Age: 78 years     Sex: Female     Is Non-Hispanic African American: Yes     Diabetic: Yes     Tobacco smoker: No     Systolic Blood Pressure: 125 mmHg     Is BP treated: Yes     HDL Cholesterol: 60 mg/dL     Total Cholesterol: 133 mg/dL   Patient is participating in a Managed Medicaid Plan: No   A/P: Diabetes longstanding currently uncontrolled. Patient is able to verbalize appropriate hypoglycemia management plan. Medication adherence appears to be optimal. Scheduled her for a visit with me in August. She is leaving the country for [place] - no refill needs at this time. -Continued current regimen.  -Extensively discussed pathophysiology of diabetes, recommended lifestyle interventions, dietary effects on blood sugar control.  -Counseled on s/sx of  and management of hypoglycemia.  -Next A1c anticipated 12/2023.   Follow-up:  Pharmacist 02/04/2024  Marene Shape, PharmD, BCACP, CPP Clinical Pharmacist [facility] [phone]

## 2023-12-23 ENCOUNTER — Other Ambulatory Visit: Payer: Self-pay

## 2023-12-23 MED ORDER — BRIMONIDINE TARTRATE 0.2 % OP SOLN
1.0000 [drp] | Freq: Two times a day (BID) | OPHTHALMIC | 3 refills | Status: AC
  Filled 2023-12-23: qty 5, 25d supply, fill #0
  Filled 2024-02-02: qty 5, 25d supply, fill #1
  Filled 2024-04-26: qty 5, 25d supply, fill #2
  Filled 2024-07-05: qty 5, 25d supply, fill #3

## 2023-12-23 MED ORDER — LATANOPROST 0.005 % OP SOLN
1.0000 [drp] | Freq: Every evening | OPHTHALMIC | 3 refills | Status: AC
  Filled 2023-12-23: qty 2.5, 50d supply, fill #0
  Filled 2024-02-02: qty 2.5, 50d supply, fill #1
  Filled 2024-04-26: qty 2.5, 50d supply, fill #2
  Filled 2024-07-05: qty 2.5, 50d supply, fill #3

## 2023-12-23 MED ORDER — DORZOLAMIDE HCL-TIMOLOL MAL 2-0.5 % OP SOLN
1.0000 [drp] | Freq: Two times a day (BID) | OPHTHALMIC | 3 refills | Status: AC
  Filled 2023-12-23: qty 10, 50d supply, fill #0
  Filled 2024-02-02: qty 10, 50d supply, fill #1
  Filled 2024-04-26: qty 10, 50d supply, fill #2
  Filled 2024-07-05: qty 10, 50d supply, fill #3

## 2024-02-02 ENCOUNTER — Other Ambulatory Visit: Payer: Self-pay | Admitting: Pharmacist

## 2024-02-02 ENCOUNTER — Other Ambulatory Visit: Payer: Self-pay

## 2024-02-02 ENCOUNTER — Telehealth: Payer: Self-pay | Admitting: Family Medicine

## 2024-02-02 MED ORDER — BASAGLAR KWIKPEN 100 UNIT/ML ~~LOC~~ SOPN
25.0000 [IU] | PEN_INJECTOR | Freq: Every day | SUBCUTANEOUS | 2 refills | Status: DC
  Filled 2024-02-02: qty 15, 60d supply, fill #0

## 2024-02-02 NOTE — Telephone Encounter (Signed)
 Called pt to confirm appt for 8/5 LVM

## 2024-02-03 ENCOUNTER — Ambulatory Visit: Admitting: Pharmacist

## 2024-02-03 ENCOUNTER — Other Ambulatory Visit (HOSPITAL_COMMUNITY): Payer: Self-pay

## 2024-02-03 ENCOUNTER — Other Ambulatory Visit: Payer: Self-pay

## 2024-02-03 ENCOUNTER — Other Ambulatory Visit: Payer: Self-pay | Admitting: Physician Assistant

## 2024-02-03 DIAGNOSIS — IMO0002 Reserved for concepts with insufficient information to code with codable children: Secondary | ICD-10-CM

## 2024-02-04 ENCOUNTER — Other Ambulatory Visit: Payer: Self-pay

## 2024-02-04 MED ORDER — BD PEN NEEDLE MINI U/F 31G X 5 MM MISC
0 refills | Status: AC
  Filled 2024-02-04: qty 100, 90d supply, fill #0

## 2024-02-05 ENCOUNTER — Other Ambulatory Visit: Payer: Self-pay

## 2024-02-06 ENCOUNTER — Other Ambulatory Visit: Payer: Self-pay

## 2024-02-12 ENCOUNTER — Other Ambulatory Visit: Payer: Self-pay

## 2024-02-17 ENCOUNTER — Other Ambulatory Visit: Payer: Self-pay

## 2024-02-24 ENCOUNTER — Encounter (INDEPENDENT_AMBULATORY_CARE_PROVIDER_SITE_OTHER): Admitting: Podiatry

## 2024-02-24 DIAGNOSIS — Z91199 Patient's noncompliance with other medical treatment and regimen due to unspecified reason: Secondary | ICD-10-CM

## 2024-02-24 NOTE — Progress Notes (Signed)
 Patient did not show for her scheduled appointment this morning.

## 2024-02-27 ENCOUNTER — Telehealth: Payer: Self-pay | Admitting: Family Medicine

## 2024-02-27 NOTE — Telephone Encounter (Signed)
 Pt unconfirmed appt 8/29 lvm

## 2024-03-02 ENCOUNTER — Encounter: Payer: Self-pay | Admitting: Family Medicine

## 2024-03-02 ENCOUNTER — Other Ambulatory Visit: Payer: Self-pay

## 2024-03-02 ENCOUNTER — Ambulatory Visit: Attending: Family Medicine | Admitting: Family Medicine

## 2024-03-02 VITALS — BP 125/74 | HR 65 | Ht 63.0 in | Wt 159.0 lb

## 2024-03-02 DIAGNOSIS — M1990 Unspecified osteoarthritis, unspecified site: Secondary | ICD-10-CM | POA: Diagnosis not present

## 2024-03-02 DIAGNOSIS — E785 Hyperlipidemia, unspecified: Secondary | ICD-10-CM | POA: Insufficient documentation

## 2024-03-02 DIAGNOSIS — E1169 Type 2 diabetes mellitus with other specified complication: Secondary | ICD-10-CM | POA: Diagnosis not present

## 2024-03-02 DIAGNOSIS — Z905 Acquired absence of kidney: Secondary | ICD-10-CM | POA: Diagnosis not present

## 2024-03-02 DIAGNOSIS — E119 Type 2 diabetes mellitus without complications: Secondary | ICD-10-CM | POA: Diagnosis present

## 2024-03-02 DIAGNOSIS — B351 Tinea unguium: Secondary | ICD-10-CM | POA: Diagnosis not present

## 2024-03-02 DIAGNOSIS — I152 Hypertension secondary to endocrine disorders: Secondary | ICD-10-CM | POA: Diagnosis not present

## 2024-03-02 DIAGNOSIS — E1159 Type 2 diabetes mellitus with other circulatory complications: Secondary | ICD-10-CM | POA: Diagnosis not present

## 2024-03-02 DIAGNOSIS — E1165 Type 2 diabetes mellitus with hyperglycemia: Secondary | ICD-10-CM | POA: Insufficient documentation

## 2024-03-02 DIAGNOSIS — K219 Gastro-esophageal reflux disease without esophagitis: Secondary | ICD-10-CM | POA: Diagnosis not present

## 2024-03-02 DIAGNOSIS — Z79899 Other long term (current) drug therapy: Secondary | ICD-10-CM | POA: Insufficient documentation

## 2024-03-02 DIAGNOSIS — Z794 Long term (current) use of insulin: Secondary | ICD-10-CM | POA: Insufficient documentation

## 2024-03-02 DIAGNOSIS — I10 Essential (primary) hypertension: Secondary | ICD-10-CM | POA: Diagnosis present

## 2024-03-02 LAB — POCT GLYCOSYLATED HEMOGLOBIN (HGB A1C): HbA1c, POC (controlled diabetic range): 9.1 % — AB (ref 0.0–7.0)

## 2024-03-02 MED ORDER — INSULIN GLARGINE-YFGN 100 UNIT/ML ~~LOC~~ SOPN
35.0000 [IU] | PEN_INJECTOR | Freq: Every day | SUBCUTANEOUS | 3 refills | Status: AC
  Filled 2024-03-02 – 2024-04-26 (×2): qty 15, 42d supply, fill #0
  Filled 2024-07-05: qty 15, 42d supply, fill #1

## 2024-03-02 NOTE — Progress Notes (Signed)
 Subjective:  Patient ID: Tina Phelps, female    DOB: June 26, 1946  Age: 78 y.o. MRN: [id number]  CC: Medical Management of Chronic Issues     Discussed the use of AI scribe software for clinical note transcription with the patient, who gave verbal consent to proceed.  History of Present Illness Tina Phelps is a 78 year old female with a history of type 2 diabetes mellitus, hypertension, hyperlipidemia, osteoarthritis, GERD, s/p R nephrectomy (In [place] secondary to 'kidney cyst') who presents for diabetes management and blood pressure monitoring. She is accompanied by a family member.  Her blood sugar levels at home are around 200 mg/dL before meals, with an J8r of 9.1%. She uses 30 units of Lantus  insulin  at night, with the lowest recorded blood sugar at 150 mg/dL. Her blood pressure is slightly elevated today despite taking her medication this morning.  She is on Crestor  for cholesterol, with normal levels last year. She has not eaten this morning in preparation for a cholesterol test.  She has not had a recent eye exam. She visited a podiatrist last year but missed a recent appointment.    Past Medical History:  Diagnosis Date   Arthritis    Diabetes mellitus without complication (HCC)    Glaucoma    Hypertension     Past Surgical History:  Procedure Laterality Date   CHOLECYSTECTOMY     NEPHRECTOMY Right    OOPHORECTOMY Right     Family History  Problem Relation Age of Onset   Diabetes Sister    Diabetes Brother    Diabetes Daughter     Social History   Socioeconomic History   Marital status: Single    Spouse name: Not on file   Number of children: Not on file   Years of education: Not on file   Highest education level: Not on file  Occupational History   Not on file  Tobacco Use   Smoking status: Never   Smokeless tobacco: Never  Vaping Use   Vaping status: Never Used  Substance and Sexual Activity   Alcohol use: No   Drug use: No   Sexual activity:  Not Currently  Other Topics Concern   Not on file  Social History Narrative   Not on file   Social Drivers of Health   Financial Resource Strain: Low Risk  (10/14/2023)   Overall Financial Resource Strain (CARDIA)    Difficulty of Paying Living Expenses: Not hard at all  Food Insecurity: No Food Insecurity (10/14/2023)   Hunger Vital Sign    Worried About Running Out of Food in the Last Year: Never true    Ran Out of Food in the Last Year: Never true  Transportation Needs: No Transportation Needs (10/14/2023)   PRAPARE - Administrator, Civil Service (Medical): No    Lack of Transportation (Non-Medical): No  Physical Activity: Sufficiently Active (10/14/2023)   Exercise Vital Sign    Days of Exercise per Week: 5 days    Minutes of Exercise per Session: 30 min  Stress: No Stress Concern Present (10/14/2023)   [employer] of Occupational Health - Occupational Stress Questionnaire    Feeling of Stress : Not at all  Social Connections: Moderately Isolated (10/14/2023)   Social Connection and Isolation Panel    Frequency of Communication with Friends and Family: More than three times a week    Frequency of Social Gatherings with Friends and Family: More than three times a week    Attends  Religious Services: Never    Active Member of Clubs or Organizations: Yes    Attends Banker Meetings: Never    Marital Status: Never married    Allergies  Allergen Reactions   Pork-Derived Products Other (See Comments)    Religious preference- non halal    Outpatient Medications Prior to Visit  Medication Sig Dispense Refill   brimonidine  (ALPHAGAN ) 0.2 % ophthalmic solution Place 1 drop into both eyes 2 (two) times daily. 5 mL 3   Cholecalciferol (VITAMIN D3) 1000 units CAPS Take 1,000 Units by mouth daily.     dorzolamide -timolol  (COSOPT ) 2-0.5 % ophthalmic solution Place 1 drop into both eyes 2 (two) times daily. 10 mL 3   gabapentin  (NEURONTIN ) 300 MG capsule  Take 1 capsule (300 mg total) by mouth at bedtime. 90 capsule 1   Insulin  Pen Needle (B-D UF III MINI PEN NEEDLES) 31G X 5 MM MISC USE AS DIRECTED 100 each 0   latanoprost  (XALATAN ) 0.005 % ophthalmic solution Place 1 drop into both eyes every evening. 2.5 mL 12   latanoprost  (XALATAN ) 0.005 % ophthalmic solution Place 1 drop into the left eye every evening. 2.5 mL 3   lisinopril  (ZESTRIL ) 20 MG tablet Take 1 tablet (20 mg total) by mouth daily. 90 tablet 1   metFORMIN  (GLUCOPHAGE -XR) 500 MG 24 hr tablet Take 2 tablets (1,000 mg total) by mouth daily with breakfast. 180 tablet 1   MURO 128 5 % ophthalmic ointment Place 1 Application into both eyes at bedtime.     pantoprazole  (PROTONIX ) 40 MG tablet Take 1 tablet (40 mg total) by mouth daily. 90 tablet 1   rosuvastatin  (CRESTOR ) 20 MG tablet Take 1 tablet (20 mg total) by mouth daily. 90 tablet 1   insulin  glargine-yfgn (SEMGLEE ) 100 UNIT/ML Pen Inject 25 Units into the skin daily. 15 mL 3   No facility-administered medications prior to visit.     ROS Review of Systems  Constitutional:  Negative for activity change and appetite change.  HENT:  Negative for sinus pressure and sore throat.   Respiratory:  Negative for chest tightness, shortness of breath and wheezing.   Cardiovascular:  Negative for chest pain and palpitations.  Gastrointestinal:  Negative for abdominal distention, abdominal pain and constipation.  Genitourinary: Negative.   Musculoskeletal: Negative.   Psychiatric/Behavioral:  Negative for behavioral problems and dysphoric mood.     Objective:  BP 125/74   Pulse 65   Ht 5' 3 (1.6 m)   Wt 159 lb (72.1 kg)   SpO2 99%   BMI 28.17 kg/m      03/02/2024   11:09 AM 03/02/2024   10:46 AM 11/26/2023   11:07 AM  BP/Weight  Systolic BP 125 157 125  Diastolic BP 74 85 74  Wt. (Lbs)  159   BMI  28.17 kg/m2       Physical Exam Constitutional:      Appearance: She is well-developed.  Cardiovascular:     Rate and  Rhythm: Normal rate.     Heart sounds: Normal heart sounds. No murmur heard. Pulmonary:     Effort: Pulmonary effort is normal.     Breath sounds: Normal breath sounds. No wheezing or rales.  Chest:     Chest wall: No tenderness.  Abdominal:     General: Bowel sounds are normal. There is no distension.     Palpations: Abdomen is soft. There is no mass.     Tenderness: There is no abdominal tenderness.  Musculoskeletal:        General: Normal range of motion.     Right lower leg: No edema.     Left lower leg: No edema.  Neurological:     Mental Status: She is alert and oriented to person, place, and time.  Psychiatric:        Mood and Affect: Mood normal.    Diabetic Foot Exam - Simple   Simple Foot Form Diabetic Foot exam was performed with the following findings: Yes 03/02/2024 12:12 PM  Visual Inspection No deformities, no ulcerations, no other skin breakdown bilaterally: Yes Sensation Testing Intact to touch and monofilament testing bilaterally: Yes Pulse Check Posterior Tibialis and Dorsalis pulse intact bilaterally: Yes Comments Tickened dystrophic left great toenail        Latest Ref Rng & Units 11/26/2023   11:13 AM 11/09/2023    5:19 AM 11/08/2023    8:27 AM  CMP  Glucose 70 - 99 mg/dL 701  68  676   BUN 8 - 27 mg/dL 20  37  51   Creatinine 0.57 - 1.00 mg/dL 8.85  8.70  8.27   Sodium 134 - 144 mmol/L 139  140  135   Potassium 3.5 - 5.2 mmol/L 4.7  3.2  3.5   Chloride 96 - 106 mmol/L 102  111  99   CO2 20 - 29 mmol/L 21  22  20    Calcium  8.7 - 10.3 mg/dL 89.9  8.5  9.2   Total Protein 6.0 - 8.5 g/dL 6.9   7.8   Total Bilirubin 0.0 - 1.2 mg/dL 0.3   0.9   Alkaline Phos 44 - 121 IU/L 125   91   AST 0 - 40 IU/L 16   22   ALT 0 - 32 IU/L 11   16     Lipid Panel     Component Value Date/Time   CHOL 133 03/18/2023 1154   TRIG 75 03/18/2023 1154   HDL 60 03/18/2023 1154   CHOLHDL 3.6 03/01/2021 0902   CHOLHDL 3.2 12/15/2013 1011   VLDL 26 12/15/2013 1011    LDLCALC 58 03/18/2023 1154    CBC    Component Value Date/Time   WBC 9.1 11/09/2023 0519   RBC 4.14 11/09/2023 0519   HGB 12.0 11/09/2023 0519   HGB 12.4 03/01/2021 0902   HCT 37.0 11/09/2023 0519   HCT 38.2 03/01/2021 0902   PLT 219 11/09/2023 0519   PLT 258 03/01/2021 0902   MCV 89.4 11/09/2023 0519   MCV 89 03/01/2021 0902   MCH 29.0 11/09/2023 0519   MCHC 32.4 11/09/2023 0519   RDW 12.8 11/09/2023 0519   RDW 12.1 03/01/2021 0902   LYMPHSABS 2.5 09/12/2022 0932   LYMPHSABS 2.6 03/01/2021 0902   MONOABS 0.5 09/12/2022 0932   EOSABS 0.1 09/12/2022 0932   EOSABS 0.1 03/01/2021 0902   BASOSABS 0.0 09/12/2022 0932   BASOSABS 0.1 03/01/2021 0902    Lab Results  Component Value Date   HGBA1C 9.1 (A) 03/02/2024    Lab Results  Component Value Date   HGBA1C 9.1 (A) 03/02/2024   HGBA1C 9.5 (H) 11/08/2023   HGBA1C 9.4 (A) 03/18/2023       Assessment & Plan Type 2 diabetes mellitus, poorly controlled A1c at 9.1% indicates poor glycemic control. Current insulin  glargine dose insufficient. - Increase insulin  glargine (Lantus ) to 35 units subcutaneously daily. - Instruct to reduce insulin  to 33 units if glucose <80 mg/dL or hypoglycemia symptoms occur. -  Refer to ophthalmologist for eye exam.  Hypertension associated with type 2 diabetes mellitus Blood pressure initially elevated but normalized on repeat measurement. - Continue current antihypertensive regimen. - Recheck blood pressure at next visit.  Hyperlipidemia associated with type 2 diabetes mellitus Managed with rosuvastatin . Last cholesterol levels normal last year. - Order lipid panel - Low-cholesterol diet  Onychomycosis Advised to schedule appointment with podiatrist for clipping of toenails on treatment     Meds ordered this encounter  Medications   insulin  glargine-yfgn (SEMGLEE ) 100 UNIT/ML Pen    Sig: Inject 35 Units into the skin daily.    Dispense:  15 mL    Refill:  3    Follow-up:  Return in about 3 months (around 06/01/2024) for Chronic medical conditions.       Corrina Sabin, MD, FAAFP. [facility] [place], [place] [phone]   03/02/2024, 11:11 AM

## 2024-03-02 NOTE — Patient Instructions (Signed)
 VISIT SUMMARY:  Today, we discussed your diabetes management and blood pressure monitoring. Your blood sugar levels have been high, and your blood pressure was slightly elevated. We also reviewed your cholesterol management and the importance of regular eye exams and foot care.  YOUR PLAN:  -TYPE 2 DIABETES MELLITUS, POORLY CONTROLLED: Type 2 diabetes is a condition where your body does not use insulin  properly, leading to high blood sugar levels. Your A1c level is 9.1%, which indicates that your blood sugar is not well controlled. We are increasing your nightly insulin  dose to 35 units. If your blood sugar drops below 80 mg/dL or you experience symptoms of low blood sugar, reduce the dose to 33 units. We are also referring you to an eye specialist for a comprehensive eye exam.  -HYPERTENSION: Hypertension is high blood pressure, which can lead to serious health problems if not managed. Your blood pressure was slightly elevated today but normalized on a repeat measurement. Continue taking your current blood pressure medication, and we will recheck your blood pressure at your next visit.  -HYPERLIPIDEMIA: Hyperlipidemia is having high levels of cholesterol in your blood, which can increase your risk of heart disease. Your cholesterol levels were normal last year, but we need to check them again. We have ordered a blood test to measure your current cholesterol levels.  INSTRUCTIONS:  Please follow up with the ophthalmologist for your eye exam. Continue monitoring your blood sugar and blood pressure at home. We will recheck your blood pressure at your next visit. Make sure to get your blood test done for cholesterol levels.

## 2024-03-03 ENCOUNTER — Telehealth: Payer: Self-pay | Admitting: Pharmacist

## 2024-03-03 NOTE — Telephone Encounter (Signed)
 Can we schedule this patient with me in October?

## 2024-03-04 ENCOUNTER — Ambulatory Visit: Payer: Self-pay | Admitting: Family Medicine

## 2024-03-04 LAB — MICROALBUMIN / CREATININE URINE RATIO
Creatinine, Urine: 103.8 mg/dL
Microalb/Creat Ratio: 26 mg/g{creat} (ref 0–29)
Microalbumin, Urine: 27.1 ug/mL

## 2024-03-04 LAB — LP+NON-HDL CHOLESTEROL
Cholesterol, Total: 190 mg/dL (ref 100–199)
HDL: 66 mg/dL (ref >39)
LDL Chol Calc (NIH): 106 mg/dL — ABNORMAL HIGH (ref 0–99)
Total Non-HDL-Chol (LDL+VLDL): 124 mg/dL (ref 0–129)
Triglycerides: 101 mg/dL (ref 0–149)
VLDL Cholesterol Cal: 18 mg/dL (ref 5–40)

## 2024-03-08 ENCOUNTER — Telehealth: Payer: Self-pay

## 2024-03-08 NOTE — Telephone Encounter (Signed)
 Patient was identified as falling into the [place] Measure - Diabetes.   Patient was: Appointment scheduled with primary care provider in the next 30 days.

## 2024-03-18 ENCOUNTER — Ambulatory Visit (INDEPENDENT_AMBULATORY_CARE_PROVIDER_SITE_OTHER): Admitting: Podiatry

## 2024-03-18 ENCOUNTER — Encounter: Payer: Self-pay | Admitting: Podiatry

## 2024-03-18 DIAGNOSIS — B351 Tinea unguium: Secondary | ICD-10-CM

## 2024-03-18 DIAGNOSIS — M79675 Pain in left toe(s): Secondary | ICD-10-CM | POA: Diagnosis not present

## 2024-03-18 DIAGNOSIS — E1165 Type 2 diabetes mellitus with hyperglycemia: Secondary | ICD-10-CM | POA: Diagnosis not present

## 2024-03-18 DIAGNOSIS — M79674 Pain in right toe(s): Secondary | ICD-10-CM | POA: Diagnosis not present

## 2024-03-18 DIAGNOSIS — Z794 Long term (current) use of insulin: Secondary | ICD-10-CM | POA: Diagnosis not present

## 2024-03-18 NOTE — Progress Notes (Addendum)
 This patient returns to my office for at risk foot care.  This patient requires this care by a professional since this patient will be at risk due to having diabetes.   This patient is unable to cut nails herself since the patient cannot reach her nails.These nails are painful walking and wearing shoes.  This patient presents for at risk foot care today.  General Appearance  Alert, conversant and in no acute stress.  Vascular  Dorsalis pedis and posterior tibial  pulses are palpable  bilaterally.  Capillary return is within normal limits  bilaterally. Temperature is within normal limits  bilaterally.  Neurologic  Senn-Weinstein monofilament wire test within normal limits  bilaterally. Muscle power within normal limits bilaterally.  Nails Thick disfigured discolored nails with subungual debris  from hallux to fifth toes bilaterally. No evidence of bacterial infection or drainage bilaterally.  Orthopedic  No limitations of motion  feet .  No crepitus or effusions noted.  No bony pathology or digital deformities noted.  Skin  normotropic skin with no porokeratosis noted bilaterally.  No signs of infections or ulcers noted.     Onychomycosis  Pain in right toes  Pain in left toes  Consent was obtained for treatment procedures.   Mechanical debridement of nails 1-5  bilaterally performed with a nail nipper.  Filed with dremel without incident.    Return office visit     prn                Told patient to return for periodic foot care and evaluation due to potential at risk complications.   Cordella Bold DPM  tomma

## 2024-04-05 ENCOUNTER — Other Ambulatory Visit: Payer: Self-pay

## 2024-04-05 DIAGNOSIS — E113312 Type 2 diabetes mellitus with moderate nonproliferative diabetic retinopathy with macular edema, left eye: Secondary | ICD-10-CM | POA: Diagnosis not present

## 2024-04-05 DIAGNOSIS — Z961 Presence of intraocular lens: Secondary | ICD-10-CM | POA: Diagnosis not present

## 2024-04-05 DIAGNOSIS — Z794 Long term (current) use of insulin: Secondary | ICD-10-CM | POA: Diagnosis not present

## 2024-04-05 DIAGNOSIS — H401132 Primary open-angle glaucoma, bilateral, moderate stage: Secondary | ICD-10-CM | POA: Diagnosis not present

## 2024-04-05 MED ORDER — DORZOLAMIDE HCL-TIMOLOL MAL 2-0.5 % OP SOLN
1.0000 [drp] | Freq: Two times a day (BID) | OPHTHALMIC | 12 refills | Status: AC
  Filled 2024-04-05: qty 10, 50d supply, fill #0

## 2024-04-05 MED ORDER — LATANOPROST 0.005 % OP SOLN
1.0000 [drp] | Freq: Every evening | OPHTHALMIC | 12 refills | Status: AC
  Filled 2024-04-05: qty 7.5, 75d supply, fill #0

## 2024-04-05 MED ORDER — BRIMONIDINE TARTRATE 0.2 % OP SOLN
1.0000 [drp] | Freq: Two times a day (BID) | OPHTHALMIC | 12 refills | Status: AC
  Filled 2024-04-05: qty 10, 50d supply, fill #0

## 2024-04-13 ENCOUNTER — Other Ambulatory Visit: Payer: Self-pay

## 2024-04-14 ENCOUNTER — Other Ambulatory Visit: Payer: Self-pay

## 2024-04-26 ENCOUNTER — Other Ambulatory Visit: Payer: Self-pay

## 2024-04-27 ENCOUNTER — Other Ambulatory Visit: Payer: Self-pay

## 2024-04-28 ENCOUNTER — Other Ambulatory Visit: Payer: Self-pay

## 2024-05-04 ENCOUNTER — Other Ambulatory Visit: Payer: Self-pay

## 2024-05-04 DIAGNOSIS — Z961 Presence of intraocular lens: Secondary | ICD-10-CM | POA: Diagnosis not present

## 2024-05-04 DIAGNOSIS — H35033 Hypertensive retinopathy, bilateral: Secondary | ICD-10-CM | POA: Diagnosis not present

## 2024-05-04 DIAGNOSIS — H401132 Primary open-angle glaucoma, bilateral, moderate stage: Secondary | ICD-10-CM | POA: Diagnosis not present

## 2024-05-04 DIAGNOSIS — E113312 Type 2 diabetes mellitus with moderate nonproliferative diabetic retinopathy with macular edema, left eye: Secondary | ICD-10-CM | POA: Diagnosis not present

## 2024-05-04 DIAGNOSIS — H44521 Atrophy of globe, right eye: Secondary | ICD-10-CM | POA: Diagnosis not present

## 2024-06-04 ENCOUNTER — Telehealth: Payer: Self-pay

## 2024-06-04 NOTE — Telephone Encounter (Signed)
 Patient was identified as falling into the [employer] [place] Measure - Diabetes.   Patient was: Left voicemail to schedule with primary care provider.  Patient will need a lab for A1C prior to year end.

## 2024-06-14 ENCOUNTER — Telehealth: Payer: Self-pay

## 2024-06-14 NOTE — Telephone Encounter (Signed)
 Patient was identified as falling into the [place] Measure - Diabetes.   Patient was: Left voicemail to schedule with primary care provider.

## 2024-06-15 DIAGNOSIS — H401132 Primary open-angle glaucoma, bilateral, moderate stage: Secondary | ICD-10-CM | POA: Diagnosis not present

## 2024-06-15 DIAGNOSIS — H44521 Atrophy of globe, right eye: Secondary | ICD-10-CM | POA: Diagnosis not present

## 2024-06-15 DIAGNOSIS — Z961 Presence of intraocular lens: Secondary | ICD-10-CM | POA: Diagnosis not present

## 2024-06-15 DIAGNOSIS — H35033 Hypertensive retinopathy, bilateral: Secondary | ICD-10-CM | POA: Diagnosis not present

## 2024-06-15 DIAGNOSIS — H43822 Vitreomacular adhesion, left eye: Secondary | ICD-10-CM | POA: Diagnosis not present

## 2024-06-15 DIAGNOSIS — E113312 Type 2 diabetes mellitus with moderate nonproliferative diabetic retinopathy with macular edema, left eye: Secondary | ICD-10-CM | POA: Diagnosis not present

## 2024-06-16 ENCOUNTER — Telehealth: Payer: Self-pay

## 2024-06-16 NOTE — Telephone Encounter (Signed)
 Patient was identified as falling into the [employer] [place] Measure - Diabetes.   Patient was: Left voicemail to schedule with primary care provider. Needs appt prior to year end if possible.

## 2024-06-17 ENCOUNTER — Ambulatory Visit: Admitting: Podiatry

## 2024-07-05 ENCOUNTER — Other Ambulatory Visit: Payer: Self-pay

## 2024-07-05 DIAGNOSIS — Z794 Long term (current) use of insulin: Secondary | ICD-10-CM

## 2024-07-05 DIAGNOSIS — K2980 Duodenitis without bleeding: Secondary | ICD-10-CM

## 2024-07-05 DIAGNOSIS — E1169 Type 2 diabetes mellitus with other specified complication: Secondary | ICD-10-CM

## 2024-07-05 DIAGNOSIS — Z09 Encounter for follow-up examination after completed treatment for conditions other than malignant neoplasm: Secondary | ICD-10-CM

## 2024-07-05 MED ORDER — LISINOPRIL 20 MG PO TABS
20.0000 mg | ORAL_TABLET | Freq: Every day | ORAL | 1 refills | Status: AC
  Filled 2024-07-05: qty 90, 90d supply, fill #0

## 2024-07-05 MED ORDER — ROSUVASTATIN CALCIUM 20 MG PO TABS
20.0000 mg | ORAL_TABLET | Freq: Every day | ORAL | 1 refills | Status: AC
  Filled 2024-07-05: qty 90, 90d supply, fill #0

## 2024-07-05 MED ORDER — PANTOPRAZOLE SODIUM 40 MG PO TBEC
40.0000 mg | DELAYED_RELEASE_TABLET | Freq: Every day | ORAL | 1 refills | Status: AC
  Filled 2024-07-05: qty 90, 90d supply, fill #0

## 2024-07-05 MED ORDER — METFORMIN HCL ER 500 MG PO TB24
1000.0000 mg | ORAL_TABLET | Freq: Every day | ORAL | 1 refills | Status: AC
  Filled 2024-07-05: qty 180, 90d supply, fill #0

## 2024-10-19 ENCOUNTER — Ambulatory Visit
